# Patient Record
Sex: Female | Born: 1950 | Race: White | Hispanic: No
Health system: Southern US, Community
[De-identification: ages and names within clinical notes are randomized; demographics above are authoritative.]

## PROBLEM LIST (undated history)

## (undated) DIAGNOSIS — I2510 Atherosclerotic heart disease of native coronary artery without angina pectoris: Secondary | ICD-10-CM

## (undated) DIAGNOSIS — F32A Depression, unspecified: Secondary | ICD-10-CM

## (undated) DIAGNOSIS — N289 Disorder of kidney and ureter, unspecified: Secondary | ICD-10-CM

## (undated) DIAGNOSIS — G479 Sleep disorder, unspecified: Secondary | ICD-10-CM

## (undated) DIAGNOSIS — E785 Hyperlipidemia, unspecified: Secondary | ICD-10-CM

## (undated) DIAGNOSIS — I639 Cerebral infarction, unspecified: Secondary | ICD-10-CM

## (undated) DIAGNOSIS — I509 Heart failure, unspecified: Secondary | ICD-10-CM

## (undated) DIAGNOSIS — E1121 Type 2 diabetes mellitus with diabetic nephropathy: Secondary | ICD-10-CM

## (undated) DIAGNOSIS — F329 Major depressive disorder, single episode, unspecified: Secondary | ICD-10-CM

## (undated) HISTORY — DX: Heart failure, unspecified: I50.9

## (undated) HISTORY — PX: ADENOIDECTOMY: SUR15

## (undated) HISTORY — PX: CHOLECYSTECTOMY: SHX55

## (undated) HISTORY — PX: TONSILLECTOMY: SUR1361

## (undated) HISTORY — DX: Major depressive disorder, single episode, unspecified: F32.9

## (undated) HISTORY — PX: CORONARY ARTERY BYPASS GRAFT: SHX141

## (undated) HISTORY — PX: WISDOM TOOTH EXTRACTION: SHX21

## (undated) HISTORY — DX: Depression, unspecified: F32.A

## (undated) HISTORY — PX: OTHER SURGICAL HISTORY: SHX169

## (undated) HISTORY — DX: Hyperlipidemia, unspecified: E78.5

## (undated) HISTORY — DX: Atherosclerotic heart disease of native coronary artery without angina pectoris: I25.10

## (undated) HISTORY — PX: TRACHEOSTOMY: SUR1362

## (undated) HISTORY — DX: Type 2 diabetes mellitus with diabetic nephropathy: E11.21

## (undated) HISTORY — DX: Sleep disorder, unspecified: G47.9

## (undated) HISTORY — DX: Cerebral infarction, unspecified: I63.9

---

## 1997-12-22 ENCOUNTER — Ambulatory Visit: Admission: RE | Admit: 1997-12-22 | Discharge: 1997-12-22 | Payer: Self-pay | Admitting: Internal Medicine

## 2000-02-22 ENCOUNTER — Inpatient Hospital Stay (HOSPITAL_COMMUNITY): Admission: AD | Admit: 2000-02-22 | Discharge: 2000-04-12 | Payer: Self-pay | Admitting: Cardiology

## 2000-02-22 ENCOUNTER — Encounter: Payer: Self-pay | Admitting: Cardiology

## 2000-02-29 ENCOUNTER — Encounter: Payer: Self-pay | Admitting: Thoracic Surgery (Cardiothoracic Vascular Surgery)

## 2000-03-01 ENCOUNTER — Encounter: Payer: Self-pay | Admitting: Thoracic Surgery (Cardiothoracic Vascular Surgery)

## 2000-03-02 ENCOUNTER — Encounter: Payer: Self-pay | Admitting: Thoracic Surgery (Cardiothoracic Vascular Surgery)

## 2000-03-02 ENCOUNTER — Encounter: Payer: Self-pay | Admitting: Cardiothoracic Surgery

## 2000-03-03 ENCOUNTER — Encounter: Payer: Self-pay | Admitting: Thoracic Surgery (Cardiothoracic Vascular Surgery)

## 2000-03-04 ENCOUNTER — Encounter: Payer: Self-pay | Admitting: Thoracic Surgery (Cardiothoracic Vascular Surgery)

## 2000-03-05 ENCOUNTER — Encounter: Payer: Self-pay | Admitting: Thoracic Surgery (Cardiothoracic Vascular Surgery)

## 2000-03-06 ENCOUNTER — Encounter: Payer: Self-pay | Admitting: Thoracic Surgery (Cardiothoracic Vascular Surgery)

## 2000-03-07 ENCOUNTER — Encounter: Payer: Self-pay | Admitting: Thoracic Surgery (Cardiothoracic Vascular Surgery)

## 2000-03-08 ENCOUNTER — Encounter: Payer: Self-pay | Admitting: Thoracic Surgery (Cardiothoracic Vascular Surgery)

## 2000-03-09 ENCOUNTER — Encounter: Payer: Self-pay | Admitting: Thoracic Surgery (Cardiothoracic Vascular Surgery)

## 2000-03-10 ENCOUNTER — Encounter: Payer: Self-pay | Admitting: Cardiothoracic Surgery

## 2000-03-11 ENCOUNTER — Encounter: Payer: Self-pay | Admitting: Thoracic Surgery (Cardiothoracic Vascular Surgery)

## 2000-03-11 ENCOUNTER — Encounter: Payer: Self-pay | Admitting: Cardiothoracic Surgery

## 2000-03-12 ENCOUNTER — Encounter: Payer: Self-pay | Admitting: Thoracic Surgery (Cardiothoracic Vascular Surgery)

## 2000-03-13 ENCOUNTER — Encounter: Payer: Self-pay | Admitting: Thoracic Surgery (Cardiothoracic Vascular Surgery)

## 2000-03-14 ENCOUNTER — Encounter: Payer: Self-pay | Admitting: Thoracic Surgery (Cardiothoracic Vascular Surgery)

## 2000-03-15 ENCOUNTER — Encounter: Payer: Self-pay | Admitting: Thoracic Surgery (Cardiothoracic Vascular Surgery)

## 2000-03-18 ENCOUNTER — Encounter: Payer: Self-pay | Admitting: Thoracic Surgery (Cardiothoracic Vascular Surgery)

## 2000-03-19 ENCOUNTER — Encounter: Payer: Self-pay | Admitting: Thoracic Surgery (Cardiothoracic Vascular Surgery)

## 2000-03-20 ENCOUNTER — Encounter: Payer: Self-pay | Admitting: Thoracic Surgery (Cardiothoracic Vascular Surgery)

## 2000-03-23 ENCOUNTER — Encounter: Payer: Self-pay | Admitting: Cardiothoracic Surgery

## 2000-03-24 ENCOUNTER — Encounter: Payer: Self-pay | Admitting: Cardiothoracic Surgery

## 2000-03-25 ENCOUNTER — Encounter: Payer: Self-pay | Admitting: Cardiothoracic Surgery

## 2000-03-27 ENCOUNTER — Encounter: Payer: Self-pay | Admitting: Thoracic Surgery (Cardiothoracic Vascular Surgery)

## 2000-03-29 ENCOUNTER — Encounter: Payer: Self-pay | Admitting: Thoracic Surgery (Cardiothoracic Vascular Surgery)

## 2000-04-01 ENCOUNTER — Encounter: Payer: Self-pay | Admitting: Thoracic Surgery (Cardiothoracic Vascular Surgery)

## 2000-04-03 ENCOUNTER — Encounter: Payer: Self-pay | Admitting: Thoracic Surgery (Cardiothoracic Vascular Surgery)

## 2000-04-04 ENCOUNTER — Encounter: Payer: Self-pay | Admitting: Thoracic Surgery (Cardiothoracic Vascular Surgery)

## 2000-04-10 ENCOUNTER — Encounter: Payer: Self-pay | Admitting: Thoracic Surgery (Cardiothoracic Vascular Surgery)

## 2000-04-12 ENCOUNTER — Encounter: Payer: Self-pay | Admitting: Thoracic Surgery (Cardiothoracic Vascular Surgery)

## 2000-04-12 ENCOUNTER — Inpatient Hospital Stay (HOSPITAL_COMMUNITY)
Admission: RE | Admit: 2000-04-12 | Discharge: 2000-05-11 | Payer: Self-pay | Admitting: Physical Medicine and Rehabilitation

## 2000-04-18 ENCOUNTER — Encounter: Payer: Self-pay | Admitting: Physical Medicine and Rehabilitation

## 2001-02-24 ENCOUNTER — Encounter: Admission: RE | Admit: 2001-02-24 | Discharge: 2001-05-25 | Payer: Self-pay | Admitting: Endocrinology

## 2002-04-02 ENCOUNTER — Ambulatory Visit (HOSPITAL_COMMUNITY): Admission: RE | Admit: 2002-04-02 | Discharge: 2002-04-02 | Payer: Self-pay | Admitting: Obstetrics and Gynecology

## 2002-04-02 ENCOUNTER — Encounter: Payer: Self-pay | Admitting: Obstetrics and Gynecology

## 2004-02-22 ENCOUNTER — Ambulatory Visit: Payer: Self-pay | Admitting: Cardiology

## 2004-04-06 ENCOUNTER — Ambulatory Visit: Payer: Self-pay | Admitting: Cardiology

## 2004-05-04 ENCOUNTER — Ambulatory Visit: Payer: Self-pay | Admitting: Cardiology

## 2004-05-25 ENCOUNTER — Ambulatory Visit: Payer: Self-pay | Admitting: Cardiology

## 2004-05-25 ENCOUNTER — Ambulatory Visit: Payer: Self-pay

## 2004-05-30 ENCOUNTER — Ambulatory Visit: Payer: Self-pay | Admitting: Cardiology

## 2004-06-12 ENCOUNTER — Ambulatory Visit: Payer: Self-pay | Admitting: Internal Medicine

## 2004-06-20 ENCOUNTER — Ambulatory Visit: Payer: Self-pay

## 2004-06-20 ENCOUNTER — Ambulatory Visit: Payer: Self-pay | Admitting: Cardiovascular Disease

## 2004-07-11 ENCOUNTER — Ambulatory Visit: Payer: Self-pay | Admitting: Internal Medicine

## 2004-08-29 ENCOUNTER — Ambulatory Visit: Payer: Self-pay | Admitting: Cardiology

## 2004-10-04 ENCOUNTER — Ambulatory Visit: Payer: Self-pay | Admitting: Cardiology

## 2004-10-10 ENCOUNTER — Ambulatory Visit: Payer: Self-pay | Admitting: Endocrinology

## 2004-10-11 ENCOUNTER — Ambulatory Visit: Payer: Self-pay | Admitting: Cardiovascular Disease

## 2004-10-27 ENCOUNTER — Ambulatory Visit: Payer: Self-pay | Admitting: Cardiology

## 2004-11-28 ENCOUNTER — Ambulatory Visit: Payer: Self-pay | Admitting: Cardiology

## 2004-12-05 ENCOUNTER — Ambulatory Visit: Payer: Self-pay | Admitting: Internal Medicine

## 2005-01-15 ENCOUNTER — Ambulatory Visit: Payer: Self-pay | Admitting: Cardiology

## 2005-09-14 ENCOUNTER — Ambulatory Visit: Payer: Self-pay | Admitting: Cardiology

## 2005-10-26 ENCOUNTER — Ambulatory Visit: Payer: Self-pay | Admitting: Internal Medicine

## 2005-11-12 ENCOUNTER — Ambulatory Visit: Payer: Self-pay | Admitting: Cardiology

## 2005-11-23 ENCOUNTER — Ambulatory Visit: Payer: Self-pay | Admitting: Endocrinology

## 2006-02-05 ENCOUNTER — Ambulatory Visit: Payer: Self-pay | Admitting: Cardiology

## 2006-02-05 ENCOUNTER — Ambulatory Visit: Payer: Self-pay | Admitting: Internal Medicine

## 2006-03-05 ENCOUNTER — Ambulatory Visit: Payer: Self-pay | Admitting: Internal Medicine

## 2006-04-02 ENCOUNTER — Ambulatory Visit: Payer: Self-pay | Admitting: Cardiology

## 2006-04-30 ENCOUNTER — Ambulatory Visit: Payer: Self-pay | Admitting: Cardiology

## 2006-06-06 ENCOUNTER — Ambulatory Visit: Payer: Self-pay | Admitting: Cardiology

## 2006-07-04 ENCOUNTER — Ambulatory Visit: Payer: Self-pay | Admitting: Cardiology

## 2006-08-01 ENCOUNTER — Ambulatory Visit: Payer: Self-pay | Admitting: Cardiology

## 2006-08-27 ENCOUNTER — Ambulatory Visit: Payer: Self-pay | Admitting: Cardiology

## 2006-08-27 LAB — CONVERTED CEMR LAB
BUN: 29 mg/dL — ABNORMAL HIGH (ref 6–23)
CO2: 28 meq/L (ref 19–32)
Calcium: 9 mg/dL (ref 8.4–10.5)
GFR calc Af Amer: 60 mL/min
GFR calc non Af Amer: 49 mL/min
Glucose, Bld: 224 mg/dL — ABNORMAL HIGH (ref 70–99)
Potassium: 4.5 meq/L (ref 3.5–5.1)

## 2006-08-29 ENCOUNTER — Ambulatory Visit: Payer: Self-pay | Admitting: Cardiovascular Disease

## 2006-09-27 ENCOUNTER — Ambulatory Visit: Payer: Self-pay | Admitting: Cardiology

## 2006-10-25 ENCOUNTER — Ambulatory Visit: Payer: Self-pay | Admitting: Cardiology

## 2006-10-29 ENCOUNTER — Ambulatory Visit: Payer: Self-pay | Admitting: Endocrinology

## 2006-11-22 ENCOUNTER — Ambulatory Visit: Payer: Self-pay | Admitting: Cardiology

## 2006-12-20 ENCOUNTER — Ambulatory Visit: Payer: Self-pay | Admitting: Internal Medicine

## 2007-01-10 ENCOUNTER — Encounter: Payer: Self-pay | Admitting: *Deleted

## 2007-01-10 DIAGNOSIS — K8020 Calculus of gallbladder without cholecystitis without obstruction: Secondary | ICD-10-CM | POA: Insufficient documentation

## 2007-01-10 DIAGNOSIS — E1129 Type 2 diabetes mellitus with other diabetic kidney complication: Secondary | ICD-10-CM

## 2007-01-10 DIAGNOSIS — E785 Hyperlipidemia, unspecified: Secondary | ICD-10-CM | POA: Insufficient documentation

## 2007-01-10 DIAGNOSIS — E1139 Type 2 diabetes mellitus with other diabetic ophthalmic complication: Secondary | ICD-10-CM | POA: Insufficient documentation

## 2007-01-10 DIAGNOSIS — F329 Major depressive disorder, single episode, unspecified: Secondary | ICD-10-CM

## 2007-01-17 ENCOUNTER — Ambulatory Visit: Payer: Self-pay | Admitting: Internal Medicine

## 2007-02-14 ENCOUNTER — Ambulatory Visit: Payer: Self-pay | Admitting: Cardiology

## 2007-02-26 ENCOUNTER — Ambulatory Visit: Payer: Self-pay | Admitting: Endocrinology

## 2007-03-14 ENCOUNTER — Ambulatory Visit: Payer: Self-pay | Admitting: Internal Medicine

## 2007-03-28 ENCOUNTER — Ambulatory Visit: Payer: Self-pay | Admitting: Internal Medicine

## 2007-04-23 ENCOUNTER — Ambulatory Visit: Payer: Self-pay | Admitting: Endocrinology

## 2007-04-23 DIAGNOSIS — I509 Heart failure, unspecified: Secondary | ICD-10-CM | POA: Insufficient documentation

## 2007-04-24 LAB — CONVERTED CEMR LAB
CO2: 29 meq/L (ref 19–32)
Calcium: 9.7 mg/dL (ref 8.4–10.5)
GFR calc Af Amer: 54 mL/min
GFR calc non Af Amer: 45 mL/min
Glucose, Bld: 153 mg/dL — ABNORMAL HIGH (ref 70–99)
Potassium: 4.4 meq/L (ref 3.5–5.1)

## 2007-04-25 ENCOUNTER — Ambulatory Visit: Payer: Self-pay | Admitting: Cardiology

## 2007-04-28 ENCOUNTER — Ambulatory Visit: Payer: Self-pay | Admitting: Internal Medicine

## 2007-04-28 LAB — CONVERTED CEMR LAB
Chloride: 106 meq/L (ref 96–112)
GFR calc Af Amer: 46 mL/min
GFR calc non Af Amer: 38 mL/min
Glucose, Bld: 96 mg/dL (ref 70–99)
Potassium: 4.1 meq/L (ref 3.5–5.1)
Sodium: 140 meq/L (ref 135–145)
TSH: 2.58 microintl units/mL (ref 0.35–5.50)

## 2007-05-09 ENCOUNTER — Encounter: Payer: Self-pay | Admitting: Cardiology

## 2007-05-09 ENCOUNTER — Ambulatory Visit: Payer: Self-pay

## 2007-05-09 ENCOUNTER — Ambulatory Visit: Payer: Self-pay | Admitting: Cardiology

## 2007-05-28 ENCOUNTER — Ambulatory Visit: Payer: Self-pay | Admitting: Cardiology

## 2007-05-28 ENCOUNTER — Ambulatory Visit: Payer: Self-pay | Admitting: Cardiovascular Disease

## 2007-05-28 LAB — CONVERTED CEMR LAB
GFR calc Af Amer: 43 mL/min
GFR calc non Af Amer: 35 mL/min
Glucose, Bld: 132 mg/dL — ABNORMAL HIGH (ref 70–99)
Potassium: 4.4 meq/L (ref 3.5–5.1)
Pro B Natriuretic peptide (BNP): 192 pg/mL — ABNORMAL HIGH (ref 0.0–100.0)
Sodium: 140 meq/L (ref 135–145)

## 2007-06-04 ENCOUNTER — Ambulatory Visit: Payer: Self-pay | Admitting: Endocrinology

## 2007-06-25 ENCOUNTER — Ambulatory Visit: Payer: Self-pay | Admitting: Cardiology

## 2007-07-09 ENCOUNTER — Ambulatory Visit: Payer: Self-pay | Admitting: Cardiology

## 2007-07-23 ENCOUNTER — Ambulatory Visit: Payer: Self-pay | Admitting: Internal Medicine

## 2007-08-06 ENCOUNTER — Ambulatory Visit: Payer: Self-pay | Admitting: Cardiology

## 2007-08-15 ENCOUNTER — Ambulatory Visit: Payer: Self-pay | Admitting: Internal Medicine

## 2007-08-15 LAB — CONVERTED CEMR LAB
BUN: 41 mg/dL — ABNORMAL HIGH (ref 6–23)
Calcium: 9.5 mg/dL (ref 8.4–10.5)
Creatinine, Ser: 1.6 mg/dL — ABNORMAL HIGH (ref 0.4–1.2)
GFR calc non Af Amer: 35 mL/min
Pro B Natriuretic peptide (BNP): 118 pg/mL — ABNORMAL HIGH (ref 0.0–100.0)

## 2007-08-27 ENCOUNTER — Ambulatory Visit: Payer: Self-pay | Admitting: Internal Medicine

## 2007-09-10 ENCOUNTER — Ambulatory Visit: Payer: Self-pay | Admitting: Cardiology

## 2007-09-24 ENCOUNTER — Ambulatory Visit: Payer: Self-pay | Admitting: Cardiology

## 2007-10-28 ENCOUNTER — Telehealth (INDEPENDENT_AMBULATORY_CARE_PROVIDER_SITE_OTHER): Payer: Self-pay | Admitting: *Deleted

## 2007-11-04 ENCOUNTER — Ambulatory Visit: Payer: Self-pay | Admitting: Endocrinology

## 2007-11-19 ENCOUNTER — Telehealth: Payer: Self-pay | Admitting: Endocrinology

## 2007-12-09 ENCOUNTER — Ambulatory Visit: Payer: Self-pay | Admitting: Internal Medicine

## 2007-12-09 LAB — CONVERTED CEMR LAB
CO2: 24 meq/L (ref 19–32)
Calcium: 9.7 mg/dL (ref 8.4–10.5)
Chloride: 110 meq/L (ref 96–112)
Creatinine, Ser: 1.7 mg/dL — ABNORMAL HIGH (ref 0.4–1.2)
Glucose, Bld: 136 mg/dL — ABNORMAL HIGH (ref 70–99)
Pro B Natriuretic peptide (BNP): 93 pg/mL (ref 0.0–100.0)
Sodium: 139 meq/L (ref 135–145)
TSH: 2.6 microintl units/mL (ref 0.35–5.50)

## 2007-12-24 ENCOUNTER — Telehealth: Payer: Self-pay | Admitting: Endocrinology

## 2008-01-06 ENCOUNTER — Ambulatory Visit: Payer: Self-pay | Admitting: Cardiology

## 2008-01-20 ENCOUNTER — Ambulatory Visit: Payer: Self-pay | Admitting: Cardiology

## 2008-02-03 ENCOUNTER — Ambulatory Visit: Payer: Self-pay | Admitting: Endocrinology

## 2008-02-03 LAB — CONVERTED CEMR LAB
ALT: 22 units/L (ref 0–35)
Albumin: 3.9 g/dL (ref 3.5–5.2)
Bilirubin, Direct: 0.1 mg/dL (ref 0.0–0.3)
Direct LDL: 100.9 mg/dL
HDL: 29.2 mg/dL — ABNORMAL LOW (ref >39.0)
Hgb A1c MFr Bld: 6.5 % — ABNORMAL HIGH (ref 4.6–6.0)
Total Protein: 8 g/dL (ref 6.0–8.3)
Triglycerides: 238 mg/dL (ref 0–149)
VLDL: 48 mg/dL — ABNORMAL HIGH (ref 0–40)

## 2008-02-11 ENCOUNTER — Ambulatory Visit: Payer: Self-pay | Admitting: Cardiology

## 2008-03-03 ENCOUNTER — Ambulatory Visit: Payer: Self-pay | Admitting: Cardiology

## 2008-03-31 ENCOUNTER — Ambulatory Visit: Payer: Self-pay | Admitting: Cardiovascular Disease

## 2008-04-23 ENCOUNTER — Ambulatory Visit: Payer: Self-pay | Admitting: Cardiology

## 2008-04-23 ENCOUNTER — Ambulatory Visit: Payer: Self-pay | Admitting: Internal Medicine

## 2008-04-28 ENCOUNTER — Ambulatory Visit: Payer: Self-pay | Admitting: Endocrinology

## 2008-04-28 LAB — CONVERTED CEMR LAB
CO2: 27 meq/L (ref 19–32)
Calcium: 10.2 mg/dL (ref 8.4–10.5)
Creatinine, Ser: 1.6 mg/dL — ABNORMAL HIGH (ref 0.4–1.2)
GFR calc Af Amer: 43 mL/min
GFR calc non Af Amer: 35 mL/min
Hgb A1c MFr Bld: 6.4 % — ABNORMAL HIGH (ref 4.6–6.0)
Pro B Natriuretic peptide (BNP): 78 pg/mL (ref 0.0–100.0)
Sodium: 138 meq/L (ref 135–145)

## 2008-05-28 ENCOUNTER — Ambulatory Visit: Payer: Self-pay | Admitting: Cardiology

## 2008-05-28 ENCOUNTER — Encounter: Payer: Self-pay | Admitting: Endocrinology

## 2008-06-25 ENCOUNTER — Ambulatory Visit: Payer: Self-pay | Admitting: Cardiovascular Disease

## 2008-07-01 ENCOUNTER — Telehealth (INDEPENDENT_AMBULATORY_CARE_PROVIDER_SITE_OTHER): Payer: Self-pay | Admitting: *Deleted

## 2008-07-27 ENCOUNTER — Ambulatory Visit: Payer: Self-pay | Admitting: Endocrinology

## 2008-07-27 ENCOUNTER — Encounter (INDEPENDENT_AMBULATORY_CARE_PROVIDER_SITE_OTHER): Payer: Self-pay | Admitting: *Deleted

## 2008-07-27 LAB — CONVERTED CEMR LAB
BUN: 45 mg/dL — ABNORMAL HIGH (ref 6–23)
Calcium: 9.7 mg/dL (ref 8.4–10.5)
Creatinine, Ser: 1.8 mg/dL — ABNORMAL HIGH (ref 0.4–1.2)
GFR calc non Af Amer: 30.62 mL/min (ref >60)
Glucose, Bld: 179 mg/dL — ABNORMAL HIGH (ref 70–99)
Potassium: 5.2 meq/L — ABNORMAL HIGH (ref 3.5–5.1)
Pro B Natriuretic peptide (BNP): 95 pg/mL (ref 0.0–100.0)

## 2008-08-12 ENCOUNTER — Ambulatory Visit: Payer: Self-pay | Admitting: Cardiology

## 2008-08-12 DIAGNOSIS — I5022 Chronic systolic (congestive) heart failure: Secondary | ICD-10-CM

## 2008-08-12 DIAGNOSIS — I6350 Cerebral infarction due to unspecified occlusion or stenosis of unspecified cerebral artery: Secondary | ICD-10-CM | POA: Insufficient documentation

## 2008-08-12 DIAGNOSIS — I509 Heart failure, unspecified: Secondary | ICD-10-CM

## 2008-08-12 DIAGNOSIS — R209 Unspecified disturbances of skin sensation: Secondary | ICD-10-CM

## 2008-08-12 DIAGNOSIS — I110 Hypertensive heart disease with heart failure: Secondary | ICD-10-CM | POA: Insufficient documentation

## 2008-08-17 ENCOUNTER — Ambulatory Visit: Payer: Self-pay | Admitting: Internal Medicine

## 2008-09-02 ENCOUNTER — Telehealth (INDEPENDENT_AMBULATORY_CARE_PROVIDER_SITE_OTHER): Payer: Self-pay | Admitting: *Deleted

## 2008-09-14 ENCOUNTER — Ambulatory Visit: Payer: Self-pay | Admitting: Cardiovascular Disease

## 2008-09-14 ENCOUNTER — Encounter: Payer: Self-pay | Admitting: *Deleted

## 2008-09-14 LAB — CONVERTED CEMR LAB: Protime: 20.4

## 2008-10-12 ENCOUNTER — Ambulatory Visit: Payer: Self-pay | Admitting: Cardiology

## 2008-10-12 LAB — CONVERTED CEMR LAB: Prothrombin Time: 22.3 s

## 2008-10-20 ENCOUNTER — Encounter: Payer: Self-pay | Admitting: *Deleted

## 2008-10-26 ENCOUNTER — Ambulatory Visit: Payer: Self-pay | Admitting: Endocrinology

## 2008-10-26 ENCOUNTER — Telehealth: Payer: Self-pay | Admitting: Endocrinology

## 2008-10-26 DIAGNOSIS — E876 Hypokalemia: Secondary | ICD-10-CM

## 2008-10-26 LAB — CONVERTED CEMR LAB
CO2: 23 meq/L (ref 19–32)
Calcium: 9.7 mg/dL (ref 8.4–10.5)
Glucose, Bld: 78 mg/dL (ref 70–99)
Potassium: 4.7 meq/L (ref 3.5–5.1)
Sodium: 136 meq/L (ref 135–145)

## 2008-10-27 ENCOUNTER — Ambulatory Visit: Payer: Self-pay | Admitting: Internal Medicine

## 2008-10-27 LAB — CONVERTED CEMR LAB
POC INR: 2.2
Prothrombin Time: 18.2 s

## 2008-11-03 ENCOUNTER — Encounter: Payer: Self-pay | Admitting: Endocrinology

## 2008-11-24 ENCOUNTER — Ambulatory Visit: Payer: Self-pay | Admitting: Cardiology

## 2008-11-24 LAB — CONVERTED CEMR LAB: POC INR: 2.5

## 2008-12-03 ENCOUNTER — Ambulatory Visit: Payer: Self-pay | Admitting: Cardiology

## 2008-12-03 DIAGNOSIS — I2510 Atherosclerotic heart disease of native coronary artery without angina pectoris: Secondary | ICD-10-CM | POA: Insufficient documentation

## 2008-12-03 DIAGNOSIS — R0989 Other specified symptoms and signs involving the circulatory and respiratory systems: Secondary | ICD-10-CM | POA: Insufficient documentation

## 2008-12-22 ENCOUNTER — Ambulatory Visit: Payer: Self-pay

## 2008-12-22 ENCOUNTER — Ambulatory Visit: Payer: Self-pay | Admitting: Cardiology

## 2008-12-22 LAB — CONVERTED CEMR LAB: POC INR: 2.6

## 2009-01-03 ENCOUNTER — Encounter: Payer: Self-pay | Admitting: Cardiology

## 2009-01-11 ENCOUNTER — Encounter: Payer: Self-pay | Admitting: Cardiology

## 2009-01-19 ENCOUNTER — Ambulatory Visit: Payer: Self-pay | Admitting: Cardiology

## 2009-02-16 ENCOUNTER — Ambulatory Visit: Payer: Self-pay | Admitting: Cardiovascular Disease

## 2009-03-02 ENCOUNTER — Encounter (INDEPENDENT_AMBULATORY_CARE_PROVIDER_SITE_OTHER): Payer: Self-pay | Admitting: *Deleted

## 2009-03-18 ENCOUNTER — Telehealth: Payer: Self-pay | Admitting: Endocrinology

## 2009-03-22 ENCOUNTER — Ambulatory Visit: Payer: Self-pay | Admitting: Internal Medicine

## 2009-03-24 ENCOUNTER — Encounter (INDEPENDENT_AMBULATORY_CARE_PROVIDER_SITE_OTHER): Payer: Self-pay | Admitting: Nurse Practitioner

## 2009-03-28 ENCOUNTER — Telehealth: Payer: Self-pay | Admitting: Endocrinology

## 2009-04-19 ENCOUNTER — Ambulatory Visit: Payer: Self-pay | Admitting: Cardiology

## 2009-04-19 ENCOUNTER — Encounter (INDEPENDENT_AMBULATORY_CARE_PROVIDER_SITE_OTHER): Payer: Self-pay | Admitting: Cardiology

## 2009-04-19 LAB — CONVERTED CEMR LAB: POC INR: 2

## 2009-06-09 ENCOUNTER — Encounter (INDEPENDENT_AMBULATORY_CARE_PROVIDER_SITE_OTHER): Payer: Self-pay | Admitting: Cardiology

## 2009-06-15 ENCOUNTER — Ambulatory Visit: Payer: Self-pay | Admitting: Cardiology

## 2009-06-15 LAB — CONVERTED CEMR LAB: POC INR: 2.8

## 2009-07-12 ENCOUNTER — Ambulatory Visit: Payer: Self-pay | Admitting: Cardiology

## 2009-07-12 LAB — CONVERTED CEMR LAB: POC INR: 2.3

## 2009-08-08 ENCOUNTER — Ambulatory Visit: Payer: Self-pay | Admitting: Cardiovascular Disease

## 2009-08-08 LAB — CONVERTED CEMR LAB: POC INR: 2.1

## 2009-10-05 ENCOUNTER — Telehealth: Payer: Self-pay | Admitting: Endocrinology

## 2009-10-07 ENCOUNTER — Ambulatory Visit: Payer: Self-pay | Admitting: Endocrinology

## 2009-10-07 DIAGNOSIS — L989 Disorder of the skin and subcutaneous tissue, unspecified: Secondary | ICD-10-CM | POA: Insufficient documentation

## 2009-10-07 LAB — CONVERTED CEMR LAB
ALT: 29 units/L (ref 0–35)
BUN: 35 mg/dL — ABNORMAL HIGH (ref 6–23)
Bilirubin Urine: NEGATIVE
CO2: 28 meq/L (ref 19–32)
Chloride: 111 meq/L (ref 96–112)
Cholesterol: 175 mg/dL (ref 0–200)
Eosinophils Relative: 4.9 % (ref 0.0–5.0)
Glucose, Bld: 73 mg/dL (ref 70–99)
HCT: 41.1 % (ref 36.0–46.0)
Hgb A1c MFr Bld: 6.2 % (ref 4.6–6.5)
Ketones, ur: NEGATIVE mg/dL
LDL Cholesterol: 105 mg/dL — ABNORMAL HIGH (ref 0–99)
Lymphs Abs: 1.8 10*3/uL (ref 0.7–4.0)
MCHC: 34.3 g/dL (ref 30.0–36.0)
MCV: 95.7 fL (ref 78.0–100.0)
Microalb Creat Ratio: 5 mg/g (ref 0.0–30.0)
Monocytes Absolute: 0.8 10*3/uL (ref 0.1–1.0)
Platelets: 229 10*3/uL (ref 150.0–400.0)
Potassium: 4.6 meq/L (ref 3.5–5.1)
TSH: 2.13 microintl units/mL (ref 0.35–5.50)
Total Bilirubin: 0.7 mg/dL (ref 0.3–1.2)
Total Protein: 7.8 g/dL (ref 6.0–8.3)
WBC: 7.8 10*3/uL (ref 4.5–10.5)
pH: 5.5 (ref 5.0–8.0)

## 2009-10-24 ENCOUNTER — Telehealth: Payer: Self-pay | Admitting: Endocrinology

## 2009-10-28 ENCOUNTER — Encounter: Payer: Self-pay | Admitting: Cardiology

## 2009-11-02 ENCOUNTER — Ambulatory Visit: Payer: Self-pay | Admitting: Cardiology

## 2009-11-02 DIAGNOSIS — I10 Essential (primary) hypertension: Secondary | ICD-10-CM

## 2009-11-02 LAB — CONVERTED CEMR LAB: POC INR: 2.3

## 2009-11-04 ENCOUNTER — Ambulatory Visit: Payer: Self-pay | Admitting: Endocrinology

## 2009-11-04 ENCOUNTER — Other Ambulatory Visit: Admission: RE | Admit: 2009-11-04 | Discharge: 2009-11-04 | Payer: Self-pay | Admitting: Endocrinology

## 2009-11-29 ENCOUNTER — Telehealth (INDEPENDENT_AMBULATORY_CARE_PROVIDER_SITE_OTHER): Payer: Self-pay | Admitting: Radiology

## 2009-11-30 ENCOUNTER — Encounter (HOSPITAL_COMMUNITY): Admission: RE | Admit: 2009-11-30 | Discharge: 2010-01-03 | Payer: Self-pay | Admitting: Cardiology

## 2009-11-30 ENCOUNTER — Ambulatory Visit: Payer: Self-pay

## 2009-11-30 ENCOUNTER — Ambulatory Visit: Payer: Self-pay | Admitting: Cardiology

## 2009-11-30 ENCOUNTER — Ambulatory Visit: Payer: Self-pay | Admitting: Cardiovascular Disease

## 2009-11-30 ENCOUNTER — Encounter: Payer: Self-pay | Admitting: Cardiology

## 2009-12-07 ENCOUNTER — Ambulatory Visit: Payer: Self-pay

## 2009-12-07 ENCOUNTER — Encounter: Payer: Self-pay | Admitting: Cardiology

## 2009-12-16 ENCOUNTER — Ambulatory Visit: Payer: Self-pay | Admitting: Endocrinology

## 2009-12-16 LAB — CONVERTED CEMR LAB: Hgb A1c MFr Bld: 6.5 % (ref 4.6–6.5)

## 2010-01-18 ENCOUNTER — Ambulatory Visit: Payer: Self-pay | Admitting: Cardiovascular Disease

## 2010-02-01 ENCOUNTER — Ambulatory Visit: Payer: Self-pay | Admitting: Cardiology

## 2010-03-03 ENCOUNTER — Ambulatory Visit: Payer: Self-pay | Admitting: Cardiology

## 2010-03-03 LAB — CONVERTED CEMR LAB: POC INR: 2.4

## 2010-03-17 ENCOUNTER — Ambulatory Visit: Payer: Self-pay | Admitting: Endocrinology

## 2010-03-17 LAB — CONVERTED CEMR LAB: Hgb A1c MFr Bld: 6.8 % — ABNORMAL HIGH (ref 4.6–6.5)

## 2010-03-31 ENCOUNTER — Ambulatory Visit: Payer: Self-pay

## 2010-05-10 ENCOUNTER — Encounter (INDEPENDENT_AMBULATORY_CARE_PROVIDER_SITE_OTHER): Payer: Self-pay | Admitting: Pharmacist

## 2010-05-14 LAB — CONVERTED CEMR LAB
ALT: 23 units/L (ref 0–35)
AST: 25 units/L (ref 0–37)
Bilirubin, Direct: 0 mg/dL (ref 0.0–0.3)
Cholesterol: 161 mg/dL (ref 0–200)
Total Bilirubin: 0.8 mg/dL (ref 0.3–1.2)

## 2010-05-15 ENCOUNTER — Ambulatory Visit: Admission: RE | Admit: 2010-05-15 | Discharge: 2010-05-15 | Payer: Self-pay | Source: Home / Self Care

## 2010-05-15 LAB — CONVERTED CEMR LAB: POC INR: 1.8

## 2010-05-16 NOTE — Assessment & Plan Note (Signed)
Summary: rov/jss  Medications Added HYDRALAZINE HCL 25 MG  TABS (HYDRALAZINE HCL) 1 twice a day VITAMIN C 500 MG CHEW (ASCORBIC ACID) 1 by mouth dialy METROGEL-VAGINAL 0.75 % GEL (METRONIDAZOLE) as directed CERAVE  CREA (EMOLLIENT) as directed      Allergies Added: NKDA   Visit Type:  Follow-up Primary Provider:  Minus Breeding MD  CC:  CAD.  History of Present Illness: The patient presents for followup. Since I last saw her she has had no acute cardiovascular problems. She is not overly active though she has to climb stairs to her second-story apartment. She has to take a break couple of times on the stairs but this is not new. She does not get chest discomfort with this. She may however get chest discomfort with emotional stress. She thinks this might be a stable pattern. She does have dyspnea with exertion but no PND or orthopnea. She is not describing any palpitations, presyncope or syncope.  Current Medications (verified): 1)  Adult Aspirin Low Strength 81 Mg  Tbdp (Aspirin) .... Take 1 By Mouth Qd 2)  Multivitamins   Tabs (Multiple Vitamin) .... Take 1 By Mouth Qd 3)  Ziac 10-6.25 Mg  Tabs (Bisoprolol-Hydrochlorothiazide) .... Take 1 By Mouth Qd 4)  Enalapril Maleate 20 Mg  Tabs (Enalapril Maleate) .... Take 1 By Mouth Two Times A Day Qd 5)  Lasix 20 Mg  Tabs (Furosemide) .Marland Kitchen.. 1 By Mouth Every Other Day 6)  Hydralazine Hcl 25 Mg  Tabs (Hydralazine Hcl) .Marland Kitchen.. 1 By Mouth Daily 7)  Vitamin D 1000 Unit Tabs (Cholecalciferol) .... Take 1 By Mouth Qd 8)  Fish Oil 1000 Mg Caps (Omega-3 Fatty Acids) .... Take 1 By Mouth Qd 9)  Coumadin 5 Mg Tabs (Warfarin Sodium) .... Take As Directed By Anticoagulation Clinic 10)  Simvastatin 40 Mg Tabs (Simvastatin) .Marland Kitchen.. 1 Once Daily 11)  Humulin 70/30 70-30 % Susp (Insulin Isophane & Regular) .... 30 Units Two Times A Day 12)  Vitamin C 500 Mg Chew (Ascorbic Acid) .Marland Kitchen.. 1 By Mouth Dialy 13)  Metrogel-Vaginal 0.75 % Gel (Metronidazole) .... As  Directed 14)  Cerave  Crea (Emollient) .... As Directed  Allergies (verified): No Known Drug Allergies  Past History:  Past Medical History: Reviewed history from 12/03/2008 and no changes required. CHF (ICD-428.0) (EF 25%) NEED PROPHYLACTIC VACCINATION&INOCULATION FLU (ICD-V04.81) CHOLELITHIASIS (ICD-574.20) MORBID OBESITY (ICD-278.01) DIABETIC  RETINOPATHY (ICD-250.50) NEPHROPATHY, DIABETIC (ICD-250.40) HYPERLIPIDEMIA (ICD-272.4) DIABETES MELLITUS, TYPE II (ICD-250.00) DEPRESSION (ICD-311) CAD CVA  Past Surgical History: Reviewed history from 12/03/2008 and no changes required. Coronary artery bypass graft x 5 (Left internal mammary artery (LIMA) to the left anterior     descending artery (LAD); saphenous vein graft (SVG) to obtuse     marginal and posterior descending artery (PDA); saphenous vein     graft (SVG) to first diagonal; saphenous vein graft (SVG) to obtuse     marginal 2). Tonsillectomy Adnoidectomy  Review of Systems       As stated in the HPI and negative for all other systems.   Vital Signs:  Patient profile:   60 year old female Height:      62 inches Weight:      257 pounds BMI:     47.18 Pulse rate:   61 / minute Resp:     18 per minute BP sitting:   141 / 66  (left arm)  Vitals Entered By: Marrion Coy, CNA (November 02, 2009 9:02 AM)  Physical Exam  General:  Well developed, well nourished, in no acute distress. Head:  normocephalic and atraumatic Eyes:  PERRLA/EOM intact; conjunctiva and lids normal. Mouth:  Teeth, gums and palate normal. Oral mucosa normal. Neck:  Neck supple, no JVD. No masses, thyromegaly or abnormal cervical nodes. Chest Wall:  Well-healed sternotomy scar Lungs:  Clear bilaterally to auscultation and percussion. Abdomen:  Bowel sounds positive; abdomen soft and non-tender without masses, organomegaly, or hernias noted. No hepatosplenomegaly, obese Msk:  Back normal, normal gait. Muscle strength and tone  normal. Extremities:  No clubbing or cyanosis. Neurologic:  Alert and oriented x 3. Skin:  Intact without lesions or rashes. Cervical Nodes:  no significant adenopathy Inguinal Nodes:  no significant adenopathy Psych:  Flat affect.   Detailed Cardiovascular Exam  Neck    Carotids: Carotids full and equal bilaterally without bruits.      Neck Veins: Normal, no JVD.    Heart    Inspection: no deformities or lifts noted.      Palpation: normal PMI with no thrills palpable.      Auscultation: regular rate and rhythm, S1, S2 without murmurs, rubs, gallops, or clicks.    Vascular    Abdominal Aorta: no palpable masses, pulsations, or audible bruits.      Femoral Pulses: normal femoral pulses bilaterally.      Pedal Pulses: normal pedal pulses bilaterally.      Radial Pulses: normal radial pulses bilaterally.      Peripheral Circulation: no clubbing, cyanosis, or edema noted with normal capillary refill.     EKG  Procedure date:  11/02/2009  Findings:      Sinus rhythm, left bundle branch block, right axis deviation  Impression & Recommendations:  Problem # 1:  CAD (ICD-414.00) It Ihas been 10 years since the patient's bypass in 6 years since her last stress test. I will order a pharmacologic perfusion study. She would not be a little walk on a treadmill. Orders: Nuclear Stress Test (Nuc Stress Test)  Problem # 2:  CHF (ICD-428.0) She seems to be euvolemic. We will assess her EF at the time of the Cardiolite. She will remain on the meds as listed.  Problem # 3:  MORBID OBESITY (ICD-278.01) We again discussed weight loss strategies.  Problem # 4:  HYPERLIPIDEMIA (ICD-272.4) I reviewed her lipids. She is not at goal. However, she has been given a new prescription by Dr. Everardo All and I will defer to his management.  Problem # 5:  ESSENTIAL HYPERTENSION, BENIGN (ICD-401.1) Her blood pressure is not controlled. She has only been taking the hydralazine once daily. I will change  this to twice daily and it can be titrated to t.i.d. if her blood pressure remains elevated. Her home systolics are typically in the 150s by her report.  Patient Instructions: 1)  Your physician recommends that you schedule a follow-up appointment in: 12 months with Dr Antoine Poche 2)  Your physician recommends that you continue on your current medications as directed. Please refer to the Current Medication list given to you today but take Hydralazine one tablet twice a day 3)  Your physician has requested that you have an adenosine myoview.  For further information please visit [email or web address]  Please follow instruction sheet, as given.

## 2010-05-16 NOTE — Progress Notes (Signed)
Summary: Nuc pre-Procedure  Phone Note Outgoing Call Call back at [facility] Phone [phone]   Call placed by: Leonia Corona, RT-N,  November 29, 2009 4:42 PM Call placed to: Patient Reason for Call: Confirm/change Appt Summary of Call: Left message with information on Myoview Information Sheet (see scanned document for details).      Nuclear Med Background Indications for Stress Test: Evaluation for Ischemia, Graft Patency   History: CABG, Echo, Heart Catheterization, Myocardial Perfusion Study  History Comments: 2001- Cath and CABG(X5) 2005- MPS- Mild Inf/Ant Ischemia.EF= 38% 1/09- Echo- EF= 20-25% CHF  Symptoms: DOE    Nuclear Pre-Procedure Cardiac Risk Factors: CVA, History of Smoking, Hypertension, IDDM Type 2, LBBB, Lipids, Obesity Height (in): 62  Nuclear Med Study Referring MD:  Romero Belling MD

## 2010-05-16 NOTE — Medication Information (Signed)
Summary: rov/ewj  Anticoagulant Therapy  Managed by: Bethena Midget, RN, BSN Referring MD: Romero Belling MD PCP: Minus Breeding MD Supervising MD: Excell Seltzer MD, Casimiro Needle Indication 1: CVA-stroke (ICD-436) Lab Used: [facility] Site: [facility] INR POC 2.1 INR RANGE 2 - 3  Dietary changes: no    Health status changes: no    Bleeding/hemorrhagic complications: no    Recent/future hospitalizations: no    Any changes in medication regimen? no    Recent/future dental: no  Any missed doses?: no       Is patient compliant with meds? yes       Allergies: No Known Drug Allergies  Anticoagulation Management History:      The patient is taking warfarin and comes in today for a routine follow up visit.  Positive risk factors for bleeding include history of CVA/TIA and presence of serious comorbidities.  Negative risk factors for bleeding include an age less than 38 years old.  The bleeding index is 'intermediate risk'.  Positive CHADS2 values include History of CHF, History of Diabetes, and Prior Stroke/CVA/TIA.  Negative CHADS2 values include Age > 69 years old.  The start date was 04/16/2000.  Her last INR was 2.3 ratio.  Anticoagulation responsible provider: Excell Seltzer MD, Casimiro Needle.  INR POC: 2.1.  Cuvette Lot#: [id number].  Exp: 09/2010.    Anticoagulation Management Assessment/Plan:      The patient's current anticoagulation dose is Coumadin 5 mg tabs: Take as directed by Anticoagulation Clinic.  The target INR is 2 - 3.  The next INR is due 09/08/2009.  Anticoagulation instructions were given to patient.  Results were reviewed/authorized by Bethena Midget, RN, BSN.  She was notified by Bethena Midget, RN, BSN.         Prior Anticoagulation Instructions: INR 2.3  Continue on same dosage 1 tablet daily except 1/2 tablet on Mondays and Fridays.  Recheck in 4 weeks.    Current Anticoagulation Instructions: INR 2.1 Continue 5mg s daily except 2.5mg s on Mondays and Fridays. Recheck in 4 weeks.

## 2010-05-16 NOTE — Progress Notes (Signed)
Summary: Chol med  Phone Note Call from Patient   Caller: Daughter [phone] Summary of Call: Pt's daughetr called stating that she would like MD to send in alternate chol med to [facility] on [address]. Pt has no preference except for generic. Initial call taken by: Margaret Pyle, CMA,  October 24, 2009 11:42 AM  Follow-up for Phone Call        i sent rx go to lab in 30 days for lipids 272.0, and liver v58.69. also, please call coumadin clinic, and advise then i started zocor. Follow-up by: Minus Breeding MD,  October 24, 2009 12:13 PM  Additional Follow-up for Phone Call Additional follow up Details #1::        Pt's daughter informed and labs scheduled for 11/28/2008 Additional Follow-up by: Margaret Pyle, CMA,  October 24, 2009 2:27 PM    New/Updated Medications: SIMVASTATIN 40 MG TABS (SIMVASTATIN) 1 once daily Prescriptions: SIMVASTATIN 40 MG TABS (SIMVASTATIN) 1 once daily  #30 x 11   Entered by:   Margaret Pyle, CMA   Authorized by:   Minus Breeding MD   Signed by:   Margaret Pyle, CMA on 10/24/2009   Method used:   Electronically to        [id number]* (retail)       [address]       [place], [place]  [address]       Ph: [phone]       Fax: [phone]   RxID:   [id number] SIMVASTATIN 40 MG TABS (SIMVASTATIN) 1 once daily  #30 x 11   Entered and Authorized by:   Minus Breeding MD   Signed by:   Minus Breeding MD on 10/24/2009   Method used:   Electronically to        [facility]  [phone]* (retail)       [address]       [place], [place]  [address]       Ph: [phone] or [phone]       Fax: [phone]   RxID:   [id number]

## 2010-05-16 NOTE — Assessment & Plan Note (Signed)
Summary: f/u appt needs refill/cd   Vital Signs:  Patient profile:   60 year old female Height:      62 inches (157.48 cm) Weight:      260.75 pounds (118.52 kg) BMI:     47.86 O2 Sat:      93 % on Room air Temp:     97.2 degrees F (36.22 degrees C) oral Pulse rate:   64 / minute BP sitting:   179 / 57  (left arm) Cuff size:   small  Vitals Entered By: Brenton Grills MA (October 07, 2009 1:22 PM)  Nutrition Counseling: Patient's BMI is greater than 25 and therefore counseled on weight management options.  O2 Flow:  Room air CC: F/U appt/pt also taking hydralazine 25 mg 1 tab qd/pt states she needs new rx for insulin refill/wants referral to derm, wants to discuss mole that has changed in texture/aj   Referring Provider:  Rollene Rotunda Primary Provider:  Minus Breeding MD  CC:  F/U appt/pt also taking hydralazine 25 mg 1 tab qd/pt states she needs new rx for insulin refill/wants referral to derm and wants to discuss mole that has changed in texture/aj.  History of Present Illness: she brings a record of her cbg's which i have reviewed today.  it varies from 90-130, with no trend throughout the day.   pt states few years of moderate redness of the face, and associated "bumps." she takes pravachol. pyuria is noted on labs.   Current Medications (verified): 1)  Adult Aspirin Low Strength 81 Mg  Tbdp (Aspirin) .... Take 1 By Mouth Qd 2)  Multivitamins   Tabs (Multiple Vitamin) .... Take 1 By Mouth Qd 3)  Ziac 10-6.25 Mg  Tabs (Bisoprolol-Hydrochlorothiazide) .... Take 1 By Mouth Qd 4)  Enalapril Maleate 20 Mg  Tabs (Enalapril Maleate) .... Take 1 By Mouth Two Times A Day Qd 5)  Novolin 70/30 70-30 %  Susp (Insulin Isophane & Regular) .... 35 Units Twice A Day 6)  Lasix 20 Mg  Tabs (Furosemide) .Marland Kitchen.. 1 By Mouth Every Other Day 7)  Hydralazine Hcl 25 Mg  Tabs (Hydralazine Hcl) .... Take 1/2  By Mouth Three Times A Day 8)  Pravachol 40 Mg Tabs (Pravastatin Sodium) .... Take 2 Every  Morning 9)  Vitamin D 1000 Unit Tabs (Cholecalciferol) .... Take 1 By Mouth Qd 10)  Fish Oil 1000 Mg Caps (Omega-3 Fatty Acids) .... Take 1 By Mouth Qd 11)  Coumadin 5 Mg Tabs (Warfarin Sodium) .... Take As Directed By Anticoagulation Clinic  Allergies (verified): No Known Drug Allergies  Past History:  Past Medical History: Last updated: 12/03/2008 CHF (ICD-428.0) (EF 25%) NEED PROPHYLACTIC VACCINATION&INOCULATION FLU (ICD-V04.81) CHOLELITHIASIS (ICD-574.20) MORBID OBESITY (ICD-278.01) DIABETIC  RETINOPATHY (ICD-250.50) NEPHROPATHY, DIABETIC (ICD-250.40) HYPERLIPIDEMIA (ICD-272.4) DIABETES MELLITUS, TYPE II (ICD-250.00) DEPRESSION (ICD-311) CAD CVA  Review of Systems  The patient denies hypoglycemia.         she has slight numbness of the feet  Physical Exam  General:  morbidly obese.   Head:  there is erythema of the malar areas Pulses:  dorsalis pedis intact bilat.   Extremities:  no deformity.  no ulcer on the feet.  feet are of normal color and temp.  no edema.  there is a healed surgical (vein harvest) scar at the right leg.  Neurologic:  sensation is intact to touch on the feet, but decerased from normal Additional Exam:  LDL Cholesterol      [H]  161 mg/dL Hemoglobin  A1C            6.2 %   Impression & Recommendations:  Problem # 1:  DIABETES MELLITUS, TYPE II (ICD-250.00) overcontrolled  Problem # 2:  SKIN DISORDER (ICD-709.9) uncertain etiology  Problem # 3:  HYPERLIPIDEMIA (ICD-272.4) needs increased rx  Problem # 4:  pyuria uncertain etiology  Medications Added to Medication List This Visit: 1)  Novolin 70/30 70-30 % Susp (Insulin isophane & regular) .... 30 units twice a day 2)  Pravachol 40 Mg Tabs (Pravastatin sodium) .... Take 2 every morning  Other Orders: TLB-Lipid Panel (80061-LIPID) TLB-BMP (Basic Metabolic Panel-BMET) (80048-METABOL) TLB-CBC Platelet - w/Differential (85025-CBCD) TLB-Hepatic/Liver Function Pnl  (80076-HEPATIC) TLB-TSH (Thyroid Stimulating Hormone) (84443-TSH) TLB-A1C / Hgb A1C (Glycohemoglobin) (83036-A1C) TLB-Microalbumin/Creat Ratio, Urine (82043-MALB) TLB-Udip w/ Micro (81001-URINE) Dermatology Referral (Derma) Est. Patient Level IV ([address])  Patient Instructions: 1)  blood tests are being ordered for you today.  please call [phone] to hear your test results. 2)  please schedule a "medicare wellness" appointment. 3)  refer dermatology.  you will be called with a day and time for an appointment. 4)  (update: i left message on phone-tree:  reduce insulin to 30 units two times a day.  i advised change pravacol to zocor or crestor. call if uti sxs). Prescriptions: NOVOLIN 70/30 70-30 %  SUSP (INSULIN ISOPHANE & REGULAR) 35 units twice a day  #3 vials x 11   Entered and Authorized by:   Minus Breeding MD   Signed by:   Minus Breeding MD on 10/07/2009   Method used:   Electronically to        [facility]  [phone]* (retail)       [address]       [place], [place]  [address]       Ph: [phone] or [phone]       Fax: [phone]   RxID:   [id number]

## 2010-05-16 NOTE — Medication Information (Signed)
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 no    Recent/future dental: no  Any missed doses?: no       Is patient compliant with meds? yes       Allergies: No Known Drug Allergies  Anticoagulation Management History:      The patient is taking warfarin and comes in today for a routine follow up visit.  Positive risk factors for bleeding include history of CVA/TIA and presence of serious comorbidities.  Negative risk factors for bleeding include an age less than 58 years old.  The bleeding index is 'intermediate risk'.  Positive CHADS2 values include History of CHF, History of HTN, History of Diabetes, and Prior Stroke/CVA/TIA.  Negative CHADS2 values include Age > 32 years old.  The start date was 04/16/2000.  Her last INR was 2.3 ratio.  Anticoagulation responsible Sylvestre Rathgeber: Myrtis Ser MD, Tinnie Gens.  INR POC: 2.4.  Cuvette Lot#: [id number].  Exp: 02/2011.    Anticoagulation Management Assessment/Plan:      The patient's current anticoagulation dose is Coumadin 5 mg tabs: Take as directed by Anticoagulation Clinic.  The target INR is 2 - 3.  The next INR is due 03/01/2010.  Anticoagulation instructions were given to patient.  Results were reviewed/authorized by Weston Brass, PharmD.  She was notified by Weston Brass PharmD.         Prior Anticoagulation Instructions: INR 1.5  Take 1 1/2 tablets today and tomorrow. Then continue same dose of 1 tablet everyday except 1/2 tablet on Monday and Friday. Recheck in 2 weeks.   Current Anticoagulation Instructions: INR 2.4  Continue same dose of 1 tablet every  day except 1/2 tablet on Monday and Friday.  Recheck INR in 4 weeks.

## 2010-05-16 NOTE — Medication Information (Signed)
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 yes       Details: on ampicillin 500mg  daily  Recent/future dental: no  Any missed doses?: yes     Details: missed 1 dose about 2 weeks ago  Is patient compliant with meds? yes       Allergies: No Known Drug Allergies  Anticoagulation Management History:      The patient is taking warfarin and comes in today for a routine follow up visit.  Positive risk factors for bleeding include history of CVA/TIA and presence of serious comorbidities.  Negative risk factors for bleeding include an age less than 25 years old.  The bleeding index is 'intermediate risk'.  Positive CHADS2 values include History of CHF, History of HTN, History of Diabetes, and Prior Stroke/CVA/TIA.  Negative CHADS2 values include Age > 38 years old.  The start date was 04/16/2000.  Her last INR was 2.3 ratio.  Anticoagulation responsible Corry Storie: Juanda Chance MD, Smitty Cords.  INR POC: 2.3.  Cuvette Lot#: [id number].  Exp: 01/2011.    Anticoagulation Management Assessment/Plan:      The patient's current anticoagulation dose is Coumadin 5 mg tabs: Take as directed by Anticoagulation Clinic.  The target INR is 2 - 3.  The next INR is due 12/29/2009.  Anticoagulation instructions were given to patient.  Results were reviewed/authorized by Weston Brass, PharmD.  She was notified by Weston Brass PharmD.         Prior Anticoagulation Instructions: INR 2.3 Continue 5mg s everyday except 2.5mg s on Mondays and Fridays. Recheck in 4 weeks.   Current Anticoagulation Instructions: INR 2.3  Continue same dose of 1 tablet  every day except 1/2 tablet on Monday and Friday.

## 2010-05-16 NOTE — Assessment & Plan Note (Signed)
Summary: 3 mth fu---stc   Vital Signs:  Patient profile:   60 year old female Height:      62 inches (157.48 cm) Weight:      262 pounds (119.09 kg) BMI:     48.09 O2 Sat:      95 % on Room air Temp:     97.5 degrees F (36.39 degrees C) oral Pulse rate:   63 / minute BP sitting:   138 / 66  (left arm) Cuff size:   small wrist cuff  Vitals Entered By: Brenton Grills CMA Duncan Dull) (March 17, 2010 9:03 AM)  O2 Flow:  Room air CC: 3 month F/U/pt is no longer taking Ampicillin/aj Is Patient Diabetic? Yes   Referring Oliviarose Punch:  Rollene Rotunda Primary Shaniece Bussa:  Minus Breeding MD  CC:  3 month F/U/pt is no longer taking Ampicillin/aj.  History of Present Illness: pt still takes 30 units of insulin two times a day.  pt states she feels well in general.  no cbg record, but states cbg's are sometimes mildly low if she misses breakfast and breakfast insulin dose.    Current Medications (verified): 1)  Adult Aspirin Low Strength 81 Mg  Tbdp (Aspirin) .... Take 1 By Mouth Qd 2)  Multivitamins   Tabs (Multiple Vitamin) .... Take 1 By Mouth Qd 3)  Ziac 10-6.25 Mg  Tabs (Bisoprolol-Hydrochlorothiazide) .... Take 1 By Mouth Qd 4)  Enalapril Maleate 20 Mg  Tabs (Enalapril Maleate) .... Take 1 By Mouth Two Times A Day Qd 5)  Lasix 20 Mg  Tabs (Furosemide) .Marland Kitchen.. 1 By Mouth Every Other Day 6)  Hydralazine Hcl 25 Mg  Tabs (Hydralazine Hcl) .Marland Kitchen.. 1 Twice A Day 7)  Vitamin D 1000 Unit Tabs (Cholecalciferol) .... Take 1 By Mouth Qd 8)  Fish Oil 1000 Mg Caps (Omega-3 Fatty Acids) .... Take 1 By Mouth Qd 9)  Coumadin 5 Mg Tabs (Warfarin Sodium) .... Take As Directed By Anticoagulation Clinic 10)  Simvastatin 40 Mg Tabs (Simvastatin) .Marland Kitchen.. 1 Once Daily 11)  Humulin 70/30 70-30 % Susp (Insulin Isophane & Regular) .... 30 Units With Breakfast, and 25 With Evening Meal). 12)  Vitamin C 500 Mg Chew (Ascorbic Acid) .Marland Kitchen.. 1 By Mouth Dialy 13)  Metrogel-Vaginal 0.75 % Gel (Metronidazole) .... As Directed 14)   Cerave  Crea (Emollient) .... As Directed 15)  Screening Mammography 16)  Ampicillin 500 Mg Caps (Ampicillin) .Marland Kitchen.. 1 By Mouth Once Daily  Allergies (verified): No Known Drug Allergies  Past History:  Past Medical History: Last updated: 12/03/2008 CHF (ICD-428.0) (EF 25%) NEED PROPHYLACTIC VACCINATION&INOCULATION FLU (ICD-V04.81) CHOLELITHIASIS (ICD-574.20) MORBID OBESITY (ICD-278.01) DIABETIC  RETINOPATHY (ICD-250.50) NEPHROPATHY, DIABETIC (ICD-250.40) HYPERLIPIDEMIA (ICD-272.4) DIABETES MELLITUS, TYPE II (ICD-250.00) DEPRESSION (ICD-311) CAD CVA  Review of Systems  The patient denies syncope.    Physical Exam  General:  morbidly obese.  no distress  Pulses:  dorsalis pedis intact bilat.   Extremities:  no deformity.  no ulcer on the feet.  feet are of normal color and temp.  no edema.  there is a healed surgical (vein harvest) scar at the right leg.  Neurologic:  sensation is intact to touch on the feet, but decrased from normal.   Impression & Recommendations:  Problem # 1:  DIABETES MELLITUS, TYPE II (ICD-250.00) overcontrolled, given this regimen, which does match insulin to her changing needs throughout the day very well.  Other Orders: TLB-A1C / Hgb A1C (Glycohemoglobin) (83036-A1C) Est. Patient Level III ([address])  Patient Instructions: 1)  blood tests are being ordered for you today.  please call [phone] to hear your test results. 2)  pending the test results, please reduce insulin to 30 units with the first meal of the day, and 25 with the last meal). 3)  Please schedule a follow-up appointment in 3 months.   Orders Added: 1)  TLB-A1C / Hgb A1C (Glycohemoglobin) [83036-A1C] 2)  Est. Patient Level III [95638]

## 2010-05-16 NOTE — Letter (Signed)
Summary: [facility]inquent Coumadin 1  Coumadin  [address]   [place], [place] [address]   Phone: [phone]  Fax: [phone]     June 09, 2009 MRN: [id number]   Sherald [name] 2-P [address] [place], [place]  [address]   Dear [name],  This letter is being sent to you as a reminder that it is necessary for you to get your INR/PT checked regularly so that we can optimize your care.  Our records indicate that you were scheduled to have a test done recently.  As of today, we have not received the results of this test.  It is very important that you have your INR checked.  Please call our office at the number listed above to schedule an appointment at your earliest convenience.    If you have recently had your protime checked or have discontinued this medication, please contact our office at the above phone number to clarify this issue.  Thank you for this prompt attention to this important health care matter.  Sincerely,   [facility] Team

## 2010-05-16 NOTE — Assessment & Plan Note (Signed)
Summary: MEDICARE WELLNESS/NWS  #   Vital Signs:  Patient profile:   60 year old female Height:      62 inches (157.48 cm) Weight:      261.13 pounds (118.70 kg) BMI:     47.93 O2 Sat:      96 % on Room air Temp:     98.2 degrees F (36.78 degrees C) oral Pulse rate:   54 / minute BP sitting:   160 / 53  (left arm) Cuff size:   small (wrist)  Vitals Entered By: Brenton Grills MA (November 04, 2009 8:25 AM)  O2 Flow:  Room air CC: Medicare Wellness/aj Is Patient Diabetic? Yes Comments Pt is due for a colonoscopy, and due for mammogram and pap  Vision Screening:Left eye with correction: 20 / 50 Right eye with correction: 20 / 30 Both eyes with correction: 20 / 40        Vision Entered By: Brenton Grills MA (November 04, 2009 8:58 AM)   Referring Provider:  Rollene Rotunda Primary Provider:  Minus Breeding MD  CC:  Medicare Wellness/aj.  History of Present Illness: pt is here for medicare welllness visit.  she denies memory loss.  she says she is able to perform activities of daily living without assistance.  she has no limitations to physical activity. pt just increased rx for bp and cholesterol yeaterday.   she says depression is much better recently.  Current Medications (verified): 1)  Adult Aspirin Low Strength 81 Mg  Tbdp (Aspirin) .... Take 1 By Mouth Qd 2)  Multivitamins   Tabs (Multiple Vitamin) .... Take 1 By Mouth Qd 3)  Ziac 10-6.25 Mg  Tabs (Bisoprolol-Hydrochlorothiazide) .... Take 1 By Mouth Qd 4)  Enalapril Maleate 20 Mg  Tabs (Enalapril Maleate) .... Take 1 By Mouth Two Times A Day Qd 5)  Lasix 20 Mg  Tabs (Furosemide) .Marland Kitchen.. 1 By Mouth Every Other Day 6)  Hydralazine Hcl 25 Mg  Tabs (Hydralazine Hcl) .Marland Kitchen.. 1 Twice A Day 7)  Vitamin D 1000 Unit Tabs (Cholecalciferol) .... Take 1 By Mouth Qd 8)  Fish Oil 1000 Mg Caps (Omega-3 Fatty Acids) .... Take 1 By Mouth Qd 9)  Coumadin 5 Mg Tabs (Warfarin Sodium) .... Take As Directed By Anticoagulation Clinic 10)   Simvastatin 40 Mg Tabs (Simvastatin) .Marland Kitchen.. 1 Once Daily 11)  Humulin 70/30 70-30 % Susp (Insulin Isophane & Regular) .... 30 Units Two Times A Day 12)  Vitamin C 500 Mg Chew (Ascorbic Acid) .Marland Kitchen.. 1 By Mouth Dialy 13)  Metrogel-Vaginal 0.75 % Gel (Metronidazole) .... As Directed 14)  Cerave  Crea (Emollient) .... As Directed  Allergies (verified): No Known Drug Allergies  Past History:  Past Medical History: Last updated: 12/03/2008 CHF (ICD-428.0) (EF 25%) NEED PROPHYLACTIC VACCINATION&INOCULATION FLU (ICD-V04.81) CHOLELITHIASIS (ICD-574.20) MORBID OBESITY (ICD-278.01) DIABETIC  RETINOPATHY (ICD-250.50) NEPHROPATHY, DIABETIC (ICD-250.40) HYPERLIPIDEMIA (ICD-272.4) DIABETES MELLITUS, TYPE II (ICD-250.00) DEPRESSION (ICD-311) CAD CVA  Family History: Reviewed history from 08/11/2008 and no changes required. Mother died at age 66 with hypertension, diabetes, heart disease.  Father died at age 23 with hypertension, cerebrovascular accident. The patient has two brothers who are alive and well, two sisters who are alive and well.  Her daughter is age 36 and is well.  Social History: Reviewed history from 08/11/2008 and no changes required.  The patient has been under stress..  The patient moved to [facility] to live with her daughter who is her only daughter.  The patient has been teaching third  grade.She has moved into an apartment on the second floor and had to use stairs frequently.  The patient does not use tobacco or alcohol nor does she exercise. no illegal drugs.  Review of Systems  The patient denies decreased hearing.         she thinks she needs new glasses.  Physical Exam  General:  morbidly obese.  no distress  Ears:  normal hearing.   Rectal:  normal external and internal exam.  heme neg  Genitalia:  Pelvic Exam:        External: normal female genitalia without lesions or masses        Vagina: normal without lesions or masses        Cervix: normal without  lesions or masses        Adnexa: normal bimanual exam without masses or fullness        Uterus: normal by palpation        Pap smear: performed Msk:  pt easily and quickly performs "get-up-and-go" from a sitting position  Psych:  remembers 3/3 at 5 minutes.  excellent recall.  can easily read and write a sentence.  alert and oriented x 3    Impression & Recommendations:  Problem # 1:  ROUTINE GENERAL MEDICAL EXAM@HEALTH  CARE FACL (ICD-V70.0)  Problem # 2:  DEPRESSION (ICD-311) Assessment: Improved  Medications Added to Medication List This Visit: 1)  Screening Mammography   Other Orders: Pneumococcal Vaccine Ped < 78yrs (60454) Admin 1st Vaccine (09811) First annual wellness visit with prevention plan  (B1478)  Preventive Care Screening     colonoscopy is refused 2011   Patient Instructions: 1)  please consider these measures for your health:  minimize alcohol.  do not use tobacco products.  have a colonoscopy at least every 10 years from age 42.  keep firearms safely stored.  always use seat belts.  have working smoke alarms in your home.  see an eye doctor and dentist regularly.  never drive under the influence of alcohol or drugs (including prescription drugs).  those with fair skin should take precautions against the sun. 2)  please let me know what your wishes would be, if artificial life support measures should become necessary.  it is critically important to prevent falling down (keep floor areas well-lit, dry, and free of loose objects) 3)  it is critically important to maintain a healthy body weight.  i would be happy to refer you to a dietician.   4)  return 6 weeks. 5)  here is a prescription for a mammogram Prescriptions: SCREENING MAMMOGRAPHY   #0 x 0   Entered and Authorized by:   Minus Breeding MD   Signed by:   Minus Breeding MD on 11/04/2009   Method used:   Print then Give to Patient   RxID:   [id number]    Preventive Care Screening      colonoscopy is refused 2011   Immunizations Administered:  Pediatric Pneumococcal Vaccine:    Vaccine Type: Prevnar    Site: right deltoid    Mfr: Merck    Dose: 0.5 ml    Route: IM    Given by: Brenton Grills MA    Exp. Date: 04/15/2011    Lot #: [id number]    VIS given: 03/25/07 version given November 04, 2009.

## 2010-05-16 NOTE — Medication Information (Signed)
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 no    Recent/future dental: no  Any missed doses?: no       Is patient compliant with meds? yes      Comments: Pt c/o of grabbing chest pain, occassionally, when laying down, that occurs at least once a day. Pt was advised to inform PCP.   Allergies: No Known Drug Allergies  Anticoagulation Management History:      The patient is taking warfarin and comes in today for a routine follow up visit.  Positive risk factors for bleeding include history of CVA/TIA and presence of serious comorbidities.  Negative risk factors for bleeding include an age less than 86 years old.  The bleeding index is 'intermediate risk'.  Positive CHADS2 values include History of CHF, History of HTN, History of Diabetes, and Prior Stroke/CVA/TIA.  Negative CHADS2 values include Age > 56 years old.  The start date was 04/16/2000.  Her last INR was 2.3 ratio.  Anticoagulation responsible [name]: Nahser.  INR POC: 2.4.  Cuvette Lot#: [id number].  Exp: 03/2011.    Anticoagulation Management Assessment/Plan:      The patient's current anticoagulation dose is Coumadin 5 mg tabs: Take as directed by Anticoagulation Clinic.  The target INR is 2 - 3.  The next INR is due 03/31/2010.  Anticoagulation instructions were given to patient.  Results were reviewed/authorized by Weston Brass, PharmD.  She was notified by Hoy Register, PharmD Candidate.         Prior Anticoagulation Instructions: INR 2.4  Continue same dose of 1 tablet every day except 1/2  tablet on Monday and Friday.  Recheck INR in 4 weeks.   Current Anticoagulation Instructions: INR 2.4 Continue previous dose of 1 tablet everyday except 0.5 tablets on Monday and Friday Recheck INR in 4 weeks

## 2010-05-16 NOTE — Medication Information (Signed)
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 yes       Details: Change Hydralazine from 1/2 tab TID to 1 tab BID. Also on Metrogel topical and will also start ABX from Dermatologist, Amphicillin 500mg  daily for 30 days.    Any missed doses?: no       Is patient compliant with meds? yes       Allergies: No Known Drug Allergies  Anticoagulation Management History:      The patient is taking warfarin and comes in today for a routine follow up visit.  Positive risk factors for bleeding include history of CVA/TIA and presence of serious comorbidities.  Negative risk factors for bleeding include an age less than 25 years old.  The bleeding index is 'intermediate risk'.  Positive CHADS2 values include History of CHF, History of Diabetes, and Prior Stroke/CVA/TIA.  Negative CHADS2 values include Age > 33 years old.  The start date was 04/16/2000.  Her last INR was 2.3 ratio.  Anticoagulation responsible provider: Shirlee Latch MD, Zaki Gertsch.  INR POC: 2.3.  Cuvette Lot#: [id number].  Exp: 01/2011.    Anticoagulation Management Assessment/Plan:      The patient's current anticoagulation dose is Coumadin 5 mg tabs: Take as directed by Anticoagulation Clinic.  The target INR is 2 - 3.  The next INR is due 11/30/2009.  Anticoagulation instructions were given to patient.  Results were reviewed/authorized by Bethena Midget, RN, BSN.  She was notified by Bethena Midget, RN, BSN.         Prior Anticoagulation Instructions: INR 2.1 Continue 5mg s daily except 2.5mg s on Mondays and Fridays. Recheck in 4 weeks.    Current Anticoagulation Instructions: INR 2.3 Continue 5mg s everyday except 2.5mg s on Mondays and Fridays. Recheck in 4 weeks.

## 2010-05-16 NOTE — Progress Notes (Signed)
Summary: Rx req  ---- Converted from flag ---- ---- 10/05/2009 1:24 PM, Verdell Face wrote: Pt needs refill by tomorrow am, pt made appt for next avial which is 6/24 Needs her insulin/[facility]...  [phone] [name] ------------------------------ 1 more fill authorized with Pharmacist.Pt needs a total of 2 vials - 20 for a 30 day period. Pt informed. Margaret Pyle, CMA  October 05, 2009 1:51 PM

## 2010-05-16 NOTE — Progress Notes (Signed)
  Phone Note Refill Request Message from:  Fax from Pharmacy  Refills Requested: Medication #1:  NOVOLIN 70/30 70-30 %  SUSP 30 units twice a day   Dosage confirmed as above?Dosage Confirmed  Method Requested: Fax to Local Pharmacy Initial call taken by: Brenton Grills MA,  October 24, 2009 2:44 PM  Follow-up for Phone Call        Pharmacy sent fax requesting Novolin 70/30 be changed to Humulin 70/30 Please Advise  Additional Follow-up for Phone Call Additional follow up Details #1::        sent Additional Follow-up by: Minus Breeding MD,  October 24, 2009 2:56 PM    New/Updated Medications: HUMULIN 70/30 70-30 % SUSP (INSULIN ISOPHANE & REGULAR) 30 units two times a day Prescriptions: HUMULIN 70/30 70-30 % SUSP (INSULIN ISOPHANE & REGULAR) 30 units two times a day  #2 vials x 11   Entered and Authorized by:   Minus Breeding MD   Signed by:   Minus Breeding MD on 10/24/2009   Method used:   Electronically to        [facility]  [phone]* (retail)       [address]       [place], [place]  [address]       Ph: [phone] or [phone]       Fax: [phone]   RxID:   [id number]

## 2010-05-16 NOTE — Assessment & Plan Note (Signed)
Summary: Cardiology Nuclear Testing  Nuclear Med Background Indications for Stress Test: Evaluation for Ischemia, Graft Patency   History: CABG, Echo, Heart Catheterization, Myocardial Perfusion Study  History Comments: '01 CABG X 5; '05 VWU:JWJX infero-anterior ischemia, EF=38%; '09 Echo:EF=20-25%; h/o CHF  Symptoms: Chest Tightness, DOE  Symptoms Comments: Last episode of CP:2 days ago.   Nuclear Pre-Procedure Cardiac Risk Factors: CVA, History of Smoking, Hypertension, IDDM Type 2, LBBB, Lipids, Obesity Caffeine/Decaff Intake: none NPO After: 12:00 AM Lungs: Clear.  O2 Sat 98% on RA. IV 0.9% NS with Angio Cath: 24g     IV Site: Rt Hand IV Started by: Bonnita Levan RN Chest Size (in) 40     Cup Size C     Height (in): 62 Weight (lb): 265 BMI: 48.64 Tech Comments: Last dose Ziac:last night  Nuclear Med Study 1 or 2 day study:  2 day     Stress Test Type:  Adenosine Reading MD:  Marca Ancona, MD     Referring MD:  Everlean Alstrom, MD Resting Radionuclide:  Technetium 62m Tetrofosmin     Resting Radionuclide Dose:  33 mCi  Stress Radionuclide:  Technetium 94m Tetrofosmin     Stress Radionuclide Dose:  33 mCi   Stress Protocol  Dose of Adenosine:  60.0 mg    Stress Test Technologist:  Rea [employer] CMA-N     Nuclear Technologist:  Doyne Keel  Rest Procedure  Myocardial perfusion imaging was performed at rest 45 minutes following the intravenous administration of Myoview Technetium 62m Tetrofosmin.  Stress Procedure  The patient received IV adenosine at 140 mcg/kg/min for 4 minutes. There were episodes of 2nd degree AVB with infusion.  She did c/o chest tightness with infusion.  Myoview was injected at the 2 minute mark and quantitative spect images were obtained after a 45 minute delay.  QPS Raw Data Images:  Normal; no motion artifact; normal heart/lung ratio. Stress Images:  Moderate inferior wall perfusion defect from base to apex.  Milder mid to apical anterior  perfusion defect.  Rest Images:  Moderate basal to mid inferior perfusion defect.  Mild mid anterior perfusion defect.  Subtraction (SDS):  Fixed basal to mid inferior perfusion defect suggests infarction.  There is evidence for some ischemia in the apical inferior wall and the apical anterior wall.  Transient Ischemic Dilatation:  1.04  (Normal <1.22)  Lung/Heart Ratio:  .32  (Normal <0.45)  Quantitative Gated Spect Images QGS EDV:  204 ml QGS ESV:  132 ml QGS EF:  35 % QGS cine images:  Severe septal hypokinesis.    Overall Impression  Exercise Capacity: Adenosine study with no exercise. BP Response: Normal blood pressure response. Clinical Symptoms: Chest tight, nausea ECG Impression: Baseline LBBB.  Brief 2nd degree heart block with adenosine infusion.  Overall Impression: Fixed basal to mid inferior perfusion defect suggests infarction.  Reversible apical inferior and apical anterior perfusion defects suggest the presence of some ischemia.  Overall Impression Comments: Moderate systolic dysfunction.   Appended Document: Cardiology Nuclear Testing Low risk scan.  She needs continued risk reduction and medical mangement.  See me in six months.  Appended Document: Cardiology Nuclear Testing pt aware of results

## 2010-05-16 NOTE — Letter (Signed)
Summary: Handout Printed  Printed Handout:  - Coumadin Instructions-w/out Meds 

## 2010-05-16 NOTE — Miscellaneous (Signed)
  Clinical Lists Changes  Observations: Added new observation of US CAROTID: 0-39% bilateral ICA stenosis  (12/22/2008 14:51)      Carotid Doppler  Procedure date:  12/22/2008  Findings:      0-39% bilateral ICA stenosis

## 2010-05-16 NOTE — Medication Information (Signed)
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 no    Recent/future dental: no  Any missed doses?: no       Is patient compliant with meds? yes       Allergies: No Known Drug Allergies  Anticoagulation Management History:      The patient is taking warfarin and comes in today for a routine follow up visit.  Positive risk factors for bleeding include history of CVA/TIA and presence of serious comorbidities.  Negative risk factors for bleeding include an age less than 69 years old.  The bleeding index is 'intermediate risk'.  Positive CHADS2 values include History of CHF, History of Diabetes, and Prior Stroke/CVA/TIA.  Negative CHADS2 values include Age > 44 years old.  The start date was 04/16/2000.  Her last INR was 2.3 ratio.  Anticoagulation responsible provider: Riley Kill MD, Maisie Fus.  INR POC: 2.8.  Cuvette Lot#: [id number].  Exp: 08/2010.    Anticoagulation Management Assessment/Plan:      The patient's current anticoagulation dose is Coumadin 5 mg tabs: Take as directed by Anticoagulation Clinic.  The target INR is 2 - 3.  The next INR is due 07/12/2009.  Anticoagulation instructions were given to patient.  Results were reviewed/authorized by Eda Keys, PharmD.  She was notified by Eda Keys.         Prior Anticoagulation Instructions: INR 2.0  Continue 0.5 tab on Monday and Friday and 1 tab on all other days.   Recheck in 4 weeks.  May want to try clotrimazole OTC cream to spots 2 - 3 times daily to see if they improve.  This is available over  the counter.  Use only a very small amount.    Current Anticoagulation Instructions: INR 2.8  Continue current dosing schedule of 1/2 tablet on Monday and Friday and 1 tablet all other days.  Return to clinic in 4 weeks.

## 2010-05-16 NOTE — Medication Information (Signed)
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 no    Recent/future dental: no  Any missed doses?: yes     Details: Missed one dose last week.  Is patient compliant with meds? yes       Allergies: No Known Drug Allergies  Anticoagulation Management History:      The patient is taking warfarin and comes in today for a routine follow up visit.  Positive risk factors for bleeding include history of CVA/TIA and presence of serious comorbidities.  Negative risk factors for bleeding include an age less than 2 years old.  The bleeding index is 'intermediate risk'.  Positive CHADS2 values include History of CHF, History of HTN, History of Diabetes, and Prior Stroke/CVA/TIA.  Negative CHADS2 values include Age > 55 years old.  The start date was 04/16/2000.  Her last INR was 2.3 ratio.  Anticoagulation responsible provider: Clifton James MD, Cristal Deer.  INR POC: 1.5.  Cuvette Lot#: [id number].  Exp: 02/2011.    Anticoagulation Management Assessment/Plan:      The patient's current anticoagulation dose is Coumadin 5 mg tabs: Take as directed by Anticoagulation Clinic.  The target INR is 2 - 3.  The next INR is due 02/01/2010.  Anticoagulation instructions were given to patient.  Results were reviewed/authorized by Weston Brass, PharmD.  She was notified by Ilean Skill D candidate.         Prior Anticoagulation Instructions: INR 2.3  Continue same dose of 1 tablet every day except 1/2 tablet on Monday and Friday.   Current Anticoagulation Instructions: INR 1.5  Take 1 1/2 tablets today  and tomorrow. Then continue same dose of 1 tablet everyday except 1/2 tablet on Monday and Friday. Recheck in 2 weeks.

## 2010-05-16 NOTE — Assessment & Plan Note (Signed)
Summary: 6 WK FU  STC   Vital Signs:  Patient profile:   60 year old female Height:      62 inches (157.48 cm) Weight:      263.50 pounds (119.77 kg) BMI:     48.37 O2 Sat:      95 % on Room air Temp:     97.9 degrees F (36.61 degrees C) oral Pulse rate:   61 / minute BP sitting:   144 / 66  (left arm) Cuff size:   SMALL wRIST  Vitals Entered By: Jarome Lamas (December 16, 2009 8:23 AM)  O2 Flow:  Room air CC: 6 week F/U/aj Is Patient Diabetic? Yes   Referring Oather Muilenburg:  Rollene Rotunda Primary Laquida Cotrell:  Minus Breeding MD  CC:  6 week F/U/aj.  History of Present Illness: no cbg record, but states cbg's are mostly in the low-100's.  it is higher later in the day than in am.  pt states she feels well in general.    Current Medications (verified): 1)  Adult Aspirin Low Strength 81 Mg  Tbdp (Aspirin) .... Take 1 By Mouth Qd 2)  Multivitamins   Tabs (Multiple Vitamin) .... Take 1 By Mouth Qd 3)  Ziac 10-6.25 Mg  Tabs (Bisoprolol-Hydrochlorothiazide) .... Take 1 By Mouth Qd 4)  Enalapril Maleate 20 Mg  Tabs (Enalapril Maleate) .... Take 1 By Mouth Two Times A Day Qd 5)  Lasix 20 Mg  Tabs (Furosemide) .Marland Kitchen.. 1 By Mouth Every Other Day 6)  Hydralazine Hcl 25 Mg  Tabs (Hydralazine Hcl) .Marland Kitchen.. 1 Twice A Day 7)  Vitamin D 1000 Unit Tabs (Cholecalciferol) .... Take 1 By Mouth Qd 8)  Fish Oil 1000 Mg Caps (Omega-3 Fatty Acids) .... Take 1 By Mouth Qd 9)  Coumadin 5 Mg Tabs (Warfarin Sodium) .... Take As Directed By Anticoagulation Clinic 10)  Simvastatin 40 Mg Tabs (Simvastatin) .Marland Kitchen.. 1 Once Daily 11)  Humulin 70/30 70-30 % Susp (Insulin Isophane & Regular) .... 30 Units Two Times A Day 12)  Vitamin C 500 Mg Chew (Ascorbic Acid) .Marland Kitchen.. 1 By Mouth Dialy 13)  Metrogel-Vaginal 0.75 % Gel (Metronidazole) .... As Directed 14)  Cerave  Crea (Emollient) .... As Directed 15)  Screening Mammography 16)  Ampicillin 500 Mg Caps (Ampicillin) .Marland Kitchen.. 1 By Mouth Once Daily  Allergies (verified): No  Known Drug Allergies  Past History:  Past Medical History: Last updated: 12/03/2008 CHF (ICD-428.0) (EF 25%) NEED PROPHYLACTIC VACCINATION&INOCULATION FLU (ICD-V04.81) CHOLELITHIASIS (ICD-574.20) MORBID OBESITY (ICD-278.01) DIABETIC  RETINOPATHY (ICD-250.50) NEPHROPATHY, DIABETIC (ICD-250.40) HYPERLIPIDEMIA (ICD-272.4) DIABETES MELLITUS, TYPE II (ICD-250.00) DEPRESSION (ICD-311) CAD CVA  Review of Systems  The patient denies hypoglycemia.    Physical Exam  Skin:  insulin injection sites at anterior abdomen are normal, except for a few ecchymoses. Additional Exam:   Hemoglobin A1C            6.5 %       Impression & Recommendations:  Problem # 1:  DIABETES MELLITUS, TYPE II (ICD-250.00) overcontrolled  Medications Added to Medication List This Visit: 1)  Humulin 70/30 70-30 % Susp (Insulin isophane & regular) .... 30 units with breakfast, and 25 with evening meal). 2)  Ampicillin 500 Mg Caps (Ampicillin) .Marland Kitchen.. 1 by mouth once daily  Other Orders: TLB-A1C / Hgb A1C (Glycohemoglobin) (83036-A1C) Est. Patient Level III (50093)  Patient Instructions: 1)  blood tests are being ordered for you today.  please call [phone] to hear your test results. 2)  pending the test  results, please continue the same medications for now 3)  Please schedule a follow-up appointment in 3 months. 4)  (update: i left message on phone-tree:  reduce insulin to 30 units am and 25 with evening meal).

## 2010-05-16 NOTE — Medication Information (Signed)
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 no    Recent/future dental: no  Any missed doses?: no       Is patient compliant with meds? yes       Allergies (verified): No Known Drug Allergies  Anticoagulation Management History:      The patient is taking warfarin and comes in today for a routine follow up visit.  Positive risk factors for bleeding include history of CVA/TIA and presence of serious comorbidities.  Negative risk factors for bleeding include an age less than 32 years old.  The bleeding index is 'intermediate risk'.  Positive CHADS2 values include History of CHF, History of Diabetes, and Prior Stroke/CVA/TIA.  Negative CHADS2 values include Age > 78 years old.  The start date was 04/16/2000.  Her last INR was 2.3 ratio.  Anticoagulation responsible provider: Shirlee Latch MD, Graziella Connery.  INR POC: 2.3.  Cuvette Lot#: [id number].  Exp: 08/2010.    Anticoagulation Management Assessment/Plan:      The patient's current anticoagulation dose is Coumadin 5 mg tabs: Take as directed by Anticoagulation Clinic.  The target INR is 2 - 3.  The next INR is due 08/09/2009.  Anticoagulation instructions were given to patient.  Results were reviewed/authorized by Cloyde Reams, RN, BSN.  She was notified by Cloyde Reams RN.         Prior Anticoagulation Instructions: INR 2.8  Continue current dosing schedule of 1/2 tablet on Monday and Friday and 1 tablet all other days.  Return to clinic in 4 weeks.   Current Anticoagulation Instructions: INR 2.3  Continue on same dosage 1 tablet daily except 1/2  tablet on Mondays and Fridays.  Recheck in 4 weeks.

## 2010-05-16 NOTE — Medication Information (Signed)
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 no    Recent/future dental: no  Any missed doses?: no       Is patient compliant with meds? yes       Current Medications (verified): 1)  Adult Aspirin Low Strength 81 Mg  Tbdp (Aspirin) .... Take 1 By Mouth Qd 2)  Multivitamins   Tabs (Multiple Vitamin) .... Take 1 By Mouth Qd 3)  Ziac 10-6.25 Mg  Tabs (Bisoprolol-Hydrochlorothiazide) .... Take 1 By Mouth Qd 4)  Enalapril Maleate 20 Mg  Tabs (Enalapril Maleate) .... Take 1 By Mouth Two Times A Day Qd 5)  Novolin 70/30 70-30 %  Susp (Insulin Isophane & Regular) .... 35 Units Twice A Day 6)  Lasix 20 Mg  Tabs (Furosemide) .... Every Other Day 7)  Hydralazine Hcl 25 Mg  Tabs (Hydralazine Hcl) .... Take 1/2  By Mouth Three Times A Day 8)  Pravachol 40 Mg Tabs (Pravastatin Sodium) .... Take 2 At Bedtime 9)  Vitamin D 1000 Unit Tabs (Cholecalciferol) .... Take 1 By Mouth Qd 10)  Fish Oil 1000 Mg Caps (Omega-3 Fatty Acids) .... Take 1 By Mouth Qd 11)  Coumadin 5 Mg Tabs (Warfarin Sodium) .... Take As Directed By Anticoagulation Clinic  Allergies (verified): No Known Drug Allergies  Anticoagulation Management History:      The patient is taking warfarin and comes in today for a routine follow up visit.  Positive risk factors for bleeding include history of CVA/TIA and presence of serious comorbidities.  Negative risk factors for bleeding include an age less than 50 years old.  The bleeding index is 'intermediate risk'.  Positive CHADS2 values include History of CHF, History of Diabetes, and  Prior Stroke/CVA/TIA.  Negative CHADS2 values include Age > 58 years old.  The start date was 04/16/2000.  Her last INR was 2.3 ratio.  Anticoagulation responsible provider: Juanda Chance MD, Smitty Cords.  INR POC: 2.0.  Cuvette Lot#: [id number].  Exp: 05/2010.    Anticoagulation Management Assessment/Plan:      The patient's current anticoagulation dose is Coumadin 5 mg tabs: Take as directed by Anticoagulation Clinic.  The target INR is 2 - 3.  The next INR is due 05/17/2009.  Anticoagulation instructions were given to patient.  Results were reviewed/authorized by Shelby Dubin, PharmD, BCPS, CPP.  She was notified by Shelby Dubin PharmD, BCPS, CPP.         Prior Anticoagulation Instructions: INR 2.8  Continue on same dosage 1 tablet daily except 1/2 tablet on Mondays and Fridays.  Recheck in 4 weeks.    Current Anticoagulation Instructions: INR 2.0  Continue 0.5 tab on Monday and Friday and 1 tab on all other days.   Recheck in 4 weeks.  May want to try clotrimazole OTC cream to spots 2 - 3 times daily to see if they improve.  This is available over the counter.  Use only a very small amount.

## 2010-05-18 NOTE — Letter (Signed)
Summary: [facility]  [address]   [place], [place] [address]   Phone: [phone]  Fax: [phone]     May 10, 2010 MRN: [id number]   [facility] [name] [address]-P [address] [place], [place]  [address]   Dear [name],  This letter is being sent to you as a reminder that it is necessary for you to get your INR/PT checked regularly so that we can optimize your care.  Our records indicate that you were scheduled to have a test done recently.  As of today, we have not received the results of this test.  It is very important that you have your INR checked.  Please call our office at the number listed above to schedule an appointment at your earliest convenience.    If you have recently had your protime checked or have discontinued this medication, please contact our office at the above phone number to clarify this issue.  Thank you for this prompt attention to this important health care matter.  Sincerely,   [facility] Team

## 2010-05-24 NOTE — Medication Information (Signed)
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 no    Recent/future dental: no  Any missed doses?: no       Is patient compliant with meds? yes       Allergies: No Known Drug Allergies  Anticoagulation Management History:      The patient is taking warfarin and comes in today for a routine follow up visit.  Positive risk factors for bleeding include history of CVA/TIA and presence of serious comorbidities.  Negative risk factors for bleeding include an age less than 62 years old.  The bleeding index is 'intermediate risk'.  Positive CHADS2 values include History of CHF, History of HTN, History of Diabetes, and Prior Stroke/CVA/TIA.  Negative CHADS2 values include Age > 60 years old.  The start date was 04/16/2000.  Her last INR was 2.3 ratio.  Anticoagulation responsible provider: Caiya Bettes.  INR POC: 1.8.  Cuvette Lot#: [id number].  Exp: 04/2011.    Anticoagulation Management Assessment/Plan:      The patient's current anticoagulation dose is Coumadin 5 mg tabs: Take as directed by Anticoagulation Clinic.  The target INR is 2 - 3.  The next INR is due 06/12/2010.  Anticoagulation instructions were given to patient.  Results were reviewed/authorized by Cloyde Reams, RN, BSN.  She was notified by Cloyde Reams, RN, BSN.         Prior Anticoagulation Instructions: INR 2.4 Continue previous dose of 1 tablet everyday except 0.5 tablets on Monday and Friday Recheck INR in 4 weeks  Current Anticoagulation Instructions: INR 1.8  Take 1 tablet today then resume same dosage 1 tablet daily except 1/2  tablet on Mondays and Fridays.  Recheck in 4 weeks.

## 2010-06-09 ENCOUNTER — Encounter: Payer: Self-pay | Admitting: Cardiology

## 2010-06-09 DIAGNOSIS — I6350 Cerebral infarction due to unspecified occlusion or stenosis of unspecified cerebral artery: Secondary | ICD-10-CM

## 2010-06-20 ENCOUNTER — Ambulatory Visit: Payer: Self-pay | Admitting: Endocrinology

## 2010-06-30 ENCOUNTER — Ambulatory Visit (INDEPENDENT_AMBULATORY_CARE_PROVIDER_SITE_OTHER): Payer: Medicare Other | Admitting: Endocrinology

## 2010-06-30 ENCOUNTER — Encounter: Payer: Self-pay | Admitting: Endocrinology

## 2010-06-30 ENCOUNTER — Other Ambulatory Visit: Payer: Self-pay | Admitting: Endocrinology

## 2010-06-30 ENCOUNTER — Other Ambulatory Visit: Payer: Medicare Other

## 2010-06-30 DIAGNOSIS — E119 Type 2 diabetes mellitus without complications: Secondary | ICD-10-CM

## 2010-07-04 NOTE — Assessment & Plan Note (Signed)
Summary: 3 MTH FU--NWS   Vital Signs:  Patient profile:   60 year old female Height:      62 inches (157.48 cm) Weight:      269.13 pounds (122.33 kg) BMI:     49.40 O2 Sat:      92 % on Room air Temp:     98.3 degrees F (36.83 degrees C) oral Pulse rate:   66 / minute Pulse rhythm:   regular BP sitting:   126 / 82  (right arm) Cuff size:   large  Vitals Entered By: Brenton Grills CMA Duncan Dull) (June 30, 2010 8:34 AM)  O2 Flow:  Room air CC: 3 month F/U/aj Is Patient Diabetic? Yes Comments Pt has never had a mammogram or a colonoscopy   Referring Provider:  Rollene Rotunda Primary Provider:  Minus Breeding MD  CC:  3 month F/U/aj.  History of Present Illness: pt states she feels well in general. she does not check cbg's.  no cbg record, but states cbg's are sometimes mildly low if she misses breakfast and breakfast insulin dose.    Current Medications (verified): 1)  Adult Aspirin Low Strength 81 Mg  Tbdp (Aspirin) .... Take 1 By Mouth Qd 2)  Multivitamins   Tabs (Multiple Vitamin) .... Take 1 By Mouth Qd 3)  Ziac 10-6.25 Mg  Tabs (Bisoprolol-Hydrochlorothiazide) .... Take 1 By Mouth Qd 4)  Enalapril Maleate 20 Mg  Tabs (Enalapril Maleate) .... Take 1 By Mouth Two Times A Day Qd 5)  Lasix 20 Mg  Tabs (Furosemide) .Marland Kitchen.. 1 By Mouth Every Other Day 6)  Hydralazine Hcl 25 Mg  Tabs (Hydralazine Hcl) .Marland Kitchen.. 1 Twice A Day 7)  Vitamin D 1000 Unit Tabs (Cholecalciferol) .... Take 1 By Mouth Qd 8)  Fish Oil 1000 Mg Caps (Omega-3 Fatty Acids) .... Take 1 By Mouth Qd 9)  Coumadin 5 Mg Tabs (Warfarin Sodium) .... Take As Directed By Anticoagulation Clinic 10)  Simvastatin 40 Mg Tabs (Simvastatin) .Marland Kitchen.. 1 Once Daily 11)  Humulin 70/30 70-30 % Susp (Insulin Isophane & Regular) .... 30 Units With Breakfast, and 25 With Evening Meal). 12)  Vitamin C 500 Mg Chew (Ascorbic Acid) .Marland Kitchen.. 1 By Mouth Dialy 13)  Metrogel-Vaginal 0.75 % Gel (Metronidazole) .... As Directed 14)  Cerave  Crea (Emollient)  .... As Directed 15)  Screening Mammography 16)  Ampicillin 500 Mg Caps (Ampicillin) .Marland Kitchen.. 1 By Mouth Once Daily  Allergies (verified): No Known Drug Allergies  Past History:  Past Medical History: Last updated: 12/03/2008 CHF (ICD-428.0) (EF 25%) NEED PROPHYLACTIC VACCINATION&INOCULATION FLU (ICD-V04.81) CHOLELITHIASIS (ICD-574.20) MORBID OBESITY (ICD-278.01) DIABETIC  RETINOPATHY (ICD-250.50) NEPHROPATHY, DIABETIC (ICD-250.40) HYPERLIPIDEMIA (ICD-272.4) DIABETES MELLITUS, TYPE II (ICD-250.00) DEPRESSION (ICD-311) CAD CVA  Review of Systems  The patient denies hypoglycemia.    Physical Exam  General:  morbidly obese.  no distress  Neck:  no masses, thyromegaly, or abnormal cervical nodes.  tracheostomy scar is present Additional Exam:   Hemoglobin A1C       [H]  7.6 %     Impression & Recommendations:  Problem # 1:  DIABETES MELLITUS, TYPE II (ICD-250.00) this is the best control this pt should aim for, given this regimen, which does match insulin to her changing needs throughout the day  Medications Added to Medication List This Visit: 1)  Ampicillin 500 Mg Caps (Ampicillin) .Marland Kitchen.. 1 by mouth once daily 2)  Onetouch Ultra Blue Strp (Glucose blood) .... Once daily, and lancets 250.01  Other Orders: TLB-A1C / Hgb  A1C (Glycohemoglobin) (83036-A1C) Est. Patient Level III ([address])  Patient Instructions: 1)  blood tests are being ordered for you today.  please call [phone] to hear your test results. 2)  pending the test results, please continue insulin to 30 units with the first meal of the day, and 25 with the last meal). 3)  Please schedule a follow-up appointment in 3 months. 4)  here is a new meter.  i have sent a prescription for strips to your pharmacy. Prescriptions: ONETOUCH ULTRA BLUE  STRP (GLUCOSE BLOOD) once daily, and lancets 250.01  #90 x 3   Entered and Authorized by:   Minus Breeding MD   Signed by:   Minus Breeding MD on 06/30/2010   Method used:    Electronically to        [facility]  [phone]* (retail)       [address]       [place], [place]  [address]       Ph: [phone] or [phone]       Fax: [phone]   RxID:   [id number]    Orders Added: 1)  TLB-A1C / Hgb A1C (Glycohemoglobin) [83036-A1C] 2)  Est. Patient Level III [95638]   Immunization History:  Influenza Immunization History:    Influenza:  historical (12/16/2009)   Immunization History:  Influenza Immunization History:    Influenza:  Historical (12/16/2009)      Appended Document: 3 MTH FU--NWS (update: i left message on phone-tree:  rx as we discussed)

## 2010-07-12 ENCOUNTER — Other Ambulatory Visit: Payer: Self-pay | Admitting: Cardiology

## 2010-07-13 NOTE — Telephone Encounter (Signed)
[address] °

## 2010-07-14 ENCOUNTER — Other Ambulatory Visit: Payer: Self-pay | Admitting: *Deleted

## 2010-07-14 MED ORDER — FUROSEMIDE 20 MG PO TABS: 20.0000 mg | ORAL_TABLET | ORAL | Status: DC

## 2010-07-25 ENCOUNTER — Other Ambulatory Visit: Payer: Self-pay | Admitting: Cardiology

## 2010-08-04 ENCOUNTER — Ambulatory Visit (INDEPENDENT_AMBULATORY_CARE_PROVIDER_SITE_OTHER): Payer: Medicare Other | Admitting: *Deleted

## 2010-08-04 DIAGNOSIS — I6350 Cerebral infarction due to unspecified occlusion or stenosis of unspecified cerebral artery: Secondary | ICD-10-CM

## 2010-08-04 LAB — POCT INR: INR: 2.9

## 2010-08-10 ENCOUNTER — Other Ambulatory Visit: Payer: Self-pay | Admitting: Endocrinology

## 2010-08-29 NOTE — Assessment & Plan Note (Signed)
[facility]                          CHRONIC HEART FAILURE NOTE   NAME:MORRIS-CAMPBELLShenaya, Lebo              MRN:          [id number]  DATE:12/09/2007                            DOB:          [birth year]    PRIMARY CARDIOLOGIST:  Rollene Rotunda, MD, [facility]   PRIMARY CARE:  Cleophas Dunker. Everardo All, MD   Ebonique returns today for further followup of her congestive heart failure  which is secondary to ischemic cardiomyopathy with EF 20%-25%.  Dewey  has been doing well since I last saw her 3 months ago.  She has multiple  health problems and has refused more aggressive treatment in the past.  She continues to enjoy quilting, has a rather sedentary lifestyle.  In  talking with Sophiya today and her daughter [name], Ms. Lyndi has not  been sleeping very well, in fact, she states there are mornings when she  wakes up and she feels like she never slept at all.  In talking with her  daughter [name], she states that she has noticed her mother seems to  be snoring more pronounced and that she hears her snorting at night  sometimes.  Other than the poor sleep, Clarine denies any symptoms  suggestive of volume overload.  She denies any orthopnea or PND.  She  states she saw Dr. Everardo All.  Since I saw her back in May, hemoglobin A1c  was 6.3 and that was in June 2009.   PAST MEDICAL HISTORY:  1. Congestive heart failure secondary to ischemic cardiomyopathy with      EF 20%-25%.  2. Coronary artery disease status post CABG.  3. Depression with the patient refusing medical therapy at this time.  4. Chronic anticoagulation therapy.  5. 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  6. Dyslipidemia.  7. Obesity.  8. Diabetes.  9. Diabetic retinopathy.  10.Diabetic nephropathy.  11.History of medical noncompliance secondary to financial issues.      The patient is currently applying for Medicaid assistance.   REVIEW OF SYSTEMS:  As stated above.   CURRENT MEDICATIONS:  1. Furosemide 20 mg daily.  2. Pravastatin 40 mg 2 tablets nightly.  3. Enalapril 20 mg 2 tablets daily.  4. Multivitamin daily.  5. Insulin 70/30, the patient takes 45 units b.i.d.  6. Klor-Con 20 mEq daily.  7. Hydralazine 12.5 mg t.i.d.  8. Bisoprolol/hydrochlorothiazide 10/6.25 mg daily.  9. Aspirin 81 daily.  10.Warfarin per Coumadin Clinic.   PHYSICAL EXAMINATION:  VITAL SIGNS:  Weight today 264 pounds, down 5  pounds from May.  Blood pressure 131/64 with a heart rate of 60.  GENERAL:  Maryalyce is in no acute distress.  NECK:  She has no signs of jugular vein distention.  LUNGS:  Clear to auscultation bilaterally.  CARDIOVASCULAR:  S1 and S2.  Regular rate and rhythm.  ABDOMEN:  Obese, soft, nontender, positive bowel sounds.  LOWER EXTREMITIES:  Without clubbing, cyanosis, or edema.  NEUROLOGICAL:  Alert and oriented x3.  Flat affect.   IMPRESSION:  Congestive heart failure secondary to ischemic  cardiomyopathy without signs of volume overload at this time.  We will  continue current medications.  I am concerned that Deyjah might have  obstructive sleep apnea.  She is agreeable to proceed with a sleep  study.  She will decide later whether or not she would attempt a CPAP if  indeed needed.  In the past, she has been somewhat reluctant to any  further treatment of her illnesses other than medications.      [name], ACNP  Electronically Signed      [name]. [name], MD  Electronically Signed   MB/MedQ  DD: 12/09/2007  DT: 12/10/2007  Job #: [id number]

## 2010-08-29 NOTE — Assessment & Plan Note (Signed)
[facility]                          CHRONIC HEART FAILURE NOTE   NAME:[name]-CAMPBELLChanelle, Hodsdon              MRN:          [id number]  DATE:07/09/2007                            DOB:          1951-01-04    PRIMARY CARE PHYSICIAN:  [name].   PRIMARY CARDIOLOGIST:  Dr. Rollene [name].   [name] returns today for follow-up of her congestive heart  failure, which is secondary to ischemic cardiomyopathy with EF of 20% to  25%, status post recent echocardiogram with no improvement from previous  EF in 2006.  [name] is accompanied by her daughter,  [name], again today.  [name] has been relatively stable.  She complains of arthritic changes in her hands.  This is rather  depressing for her, because she spends the majority of her time  quilting.  This is what brings her joy in life.  She states she has had  to take one extra dose of Lasix, since I last saw her for some mild  weight gain.  This resolved a problem, and she has had no further issues  with volume overload since then.  She states she has seen Dr. Everardo [name],  and adjustments were made in her insulin; otherwise, denies any  orthopnea or PND.  No pre-syncope or syncopal episodes.  She continues  to remain dyspneic with minimal exertion and continues to adamantly  refuse any further consideration of interventions that might improve her  quality of life or halt sudden cardiac death.  You can refer to my  previous notes, for further discussion regarding this with Ms. MorrisOrvan [name] and her daughter, [name].   PAST MEDICAL HISTORY:  1. Congestive heart failure secondary to ischemic cardiomyopathy with      EF 20-25.  2. Coronary artery disease status post CABG.  3. 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  4. Hypertension.  5. Diabetes.  6. 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  7. Stress Myoview 2005 showed EF 38% with mild ischemia in the      inferior basal wall with global hypokinesis.  8. Depression with the patient refusing medical therapy.  9. Dyslipidemia.  10.Obesity.  11.Chronic anticoagulation therapy, followed here in the Coumadin      Clinic.  12.Diabetic retinopathy.  13.Remote history of tobacco use.  14.History of medical noncompliance, secondary to financial issues.   REVIEW OF SYSTEMS:  As stated above, otherwise negative.   CURRENT MEDICATIONS:  Include:  1. Furosemide 20 mg daily.  2. Pravastatin 80 mg q.h.s.  3. Enalapril 40 mg daily.  4. Multivitamin daily.  5. Insulin per Dr. Everardo [name].  6. Warfarin per Coumadin Clinic.  7. Aspirin 81 mg daily.  8. Metoprolol HCTZ 10/6.25 mg daily.   PHYSICAL EXAM:  VITAL SIGNS:  Weight 268.  Weight is up 2 pounds from  February, blood pressure 136/68 with heart rate of 57.  GENERAL:  [name] is in no acute distress.  NECK:  Mild JVD, 78 cm at a 45-degree angle.  LUNGS:  Clear to auscultation with distant breath sounds  throughout.  CARDIOVASCULAR:  Exam reveals S1 and S2, regular rate and rhythm.  I do  not hear any murmur, rubs or gallops.  ABDOMEN:  Obese, soft, nontender, positive bowel sounds.  LOWER EXTREMITIES:  Without clubbing, cyanosis or edema, maybe just a  trace of edema noted, outer ankle area.  NEUROLOGICAL:  Alert and oriented x3, ambulating in clinic without  assistance, somewhat flat affect.   IMPRESSION:  Congestive heart failure secondary to ischemic  cardiomyopathy, without signs of volume overload at this time.  Will  continue current medications.  The patient has no further interest in  ICD therapy.  Lab work checked in February showed a potassium of 4.4,  BUN and creatinine 29 and 1.6.  BNP of 192.  I will see [name] back in 2  months, sooner if she has any problems.      [name], ACNP  Electronically Signed      [name] Rotunda, MD, [facility]   Electronically Signed   MB/MedQ  DD: 07/09/2007  DT: 07/09/2007  Job #: [id number]

## 2010-08-29 NOTE — Assessment & Plan Note (Signed)
[facility]                          CHRONIC HEART FAILURE NOTE   NAME:MORRIS-CAMPBELLJolyssa, [name]              MRN:          [id number]  DATE:08/15/2007                            DOB:          12/26/1950    PRIMARY CARDIOLOGIST:  Dr. Rollene Rotunda.   PRIMARY CARE PHYSICIAN:  Dr. Romero Belling.   [name] returns today for follow-up of her congestive heart failure which  is secondary to ischemic cardiomyopathy with EF 20-25% status post  recent echocardiogram with no improvement in ejection fraction.  When I  last saw [name] I talked with her at length and also her daughter  [name] concerning her cardiomyopathy and risk for sudden cardiac  death.  [name] adamantly refuses any further consideration of ICD, and  only wants to maintain on medications.  We had a very extensive  conversation on her feelings about her health in general.  See my note  dated May 28, 2007 for further communication.  [name] states she has  been doing well since I last saw her.  She complains of some numbness in  her hands.  She does a lot of quilting.  I wonder whether or not she may  have some carpal tunnel syndrome or it could be some neuropathy from her  diabetes.  She states her sugars have been running around 100 fasting in  the mornings.  She is complaining of a scratch on her right heel that is  tender, but does not have any erythema or drainage.  Her weight today is  up 1 pound, but she denies any shortness of breath or orthopnea.  She  complains of being restless at night and sleeping poorly, but this is  not new for her.  She is dyspneic on exertion without change in previous  status.   PAST MEDICAL HISTORY:  1. Congestive heart failure secondary to ischemic cardiomyopathy with      EF 20-25%.  2. Coronary artery disease status CABG.  3. 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  4. Hypertension.  5. Diabetes.  6. Status post tracheostomy  following her CVA in 2001 also with      temporary PEG tube placement at that time for nutritional      supplement.  7. Stress Myoview 2005 showing EF 38% with mild ischemia in inferior      basal wall with global hypokinesis.  8. Depression with the patient refusing medical therapy at this time.  9. Dyslipidemia.  10.Obesity.  11.Chronic anticoagulation therapy followed here in the [facility].  12.Diabetic nephropathy.  13.Remote history of tobacco use.  14.History of medical noncompliance secondary to financial issues.   CURRENT MEDICATIONS:  1. Furosemide 20 mg daily.  2. Pravastatin 80 mg q.h.s.  3. Enalapril 40 mg daily.  4. Multivitamin daily.  5. Insulin per Dr. Everardo All.  6. Warfarin per [facility].  7. Aspirin 81 mg daily.  8. Bisoprolol/HCTZ 10/6.25 mg daily.   REVIEW OF SYSTEMS:  As stated above; otherwise, negative.   PHYSICAL EXAMINATION:  VITAL SIGNS:  Weight 269 pounds, blood pressure  168/86 with a heart rate of 68.  GENERAL:  In no acute distress.  NECK:  No signs of jugular vein distention at 45 degree angle.  LUNGS:  Clear to auscultation bilaterally.  CARDIOVASCULAR:  Reveals S1, S2, regular rate and rhythm.  ABDOMEN:  Obese, soft, nontender, positive bowel sounds.  EXTREMITIES:  Lower extremities without clubbing, cyanosis.  She has  trace of edema.  NEUROLOGICAL:  Alert and oriented x3, flat affect.   IMPRESSION:  Congestive heart failure secondary to ischemic  cardiomyopathy with ejection fraction maintained at 20-25% with patient  declining any consideration of ICD or further discussion regarding this.  I am going to start her on hydralazine 12.5 mg t.i.d.  I initially  considered spironolactone.  However, [name] has some mild renal  insufficiency and creatinine 1.5 over the last few visits with potassium  running between 4.1 and 4.5.  Will add hydralazine and check lab work.  The patient is due to follow up with Dr. Antoine Poche for  routine cardiology  visit.  Will also arrange that.      [name], ACNP  Electronically Signed      [name]. [name], MD  Electronically Signed   MB/MedQ  DD: 08/15/2007  DT: 08/15/2007  Job #: [id number]   cc:   Gregary Signs A. Everardo All, MD

## 2010-08-29 NOTE — Assessment & Plan Note (Signed)
[facility]                          CHRONIC HEART FAILURE NOTE   NAME:[name], [name]              MRN:          [id number]  DATE:04/28/2007                            DOB:          12-27-1950    PRIMARY CARE PHYSICIAN:  Sean A. Everardo All, MD   PRIMARY CARDIOLOGIST:  Rollene Rotunda, MD, [facility]   NEURO:  Deanna Artis. Sharene Skeans, M.D.   CVTS:  Salvatore Decent. Dorris Fetch, M.D.   [name] is new to my heart failure clinic.  She was  previously followed here back in 2006. Has really had a lapse in her  medical care since that time. She did see Dr. Antoine Poche back in mid 2008,  but has not followed up with cardiology since then. She returns today at  the request of Dr. Everardo All, who is her primary care physician.  [name] states she has had socioeconomic/financial problems that  have limited her ability to return to the doctor's office for medical  care.  She does state compliance with having her PT/INR check here in  the [facility]. She actually stated that if she did not have a  medical problem going on, she did not see the need to keep coming to the  doctor. This is the same attitude she had prior to her CABG back in  2002.  She states that she was in good health until she started having  some swelling in her legs and then everything happened at one time.  Previously she was a third grade teacher, very independent and active.  She now lives with her daughter and has had multiple medical problems  since 2002 at the time of bypass. Last evaluation of her ejection  fraction appears to be back in 2006, at which time her EF was reported  at less than 25%. Congestive heart failure secondary to ischemic  cardiomyopathy status post CABG 2002.  [name] states that  she has been doing okay since she last saw Dr. Antoine Poche 2007. When she  saw Dr. Everardo All recently she states she had a chest x-ray done that  showed fluid around  her heart per the patient's report.  She was started  on furosemide 20 mg daily and instructed to return here for follow-up of  her heart failure.  She denies any orthopnea or PND, although she states  she becomes extremely dyspneic with exertion as stated above.  She lives  here in [place] with her daughter who works full-time. Just recently  was approved for disability.  She states she has been fighting the  system for several years trying to get disability as she has no medical  insurance. Finally resorted to assistance with probably an associate law  firm. She states she does take all of her medications as prescribed.  Previously was on medications that required a p.m. dosing and often  forgot to take those just secondary to forgetfulness.  She does very  limited housework and has a very sedentary lifestyle.  She enjoys  quilting and takes a quilting class two nights a week. Other than that  minimal activity and has seen an increase in  her weight over the last  year or so. She does not feel it is fluid.  She just states that she  does not do any activity.  She goes to the store a few times a week with  her daughter, but states she is very uncomfortable for her because she  has to stop at least three times from the parking lot to the store  secondary to increased shortness of breath. She denies any orthopnea or  PND. States her CBG usually run 130s in the morning and Dr. Everardo All  manages her diabetes. She does very light housework, minimal cooking.  Her daughter generally does the cooking and strenuous housework.  She  states she does care for her cat and she enjoys quilting. These are the  things that give her pleasure in life.  She denies being depressed but  has a history of depression and is currently not on any medications.   PAST MEDICAL HISTORY:  1. Congestive heart failure secondary to ischemic cardiomyopathy with      an ejection fraction of less than 25%. By ultrasound in  2006  2. Coronary artery disease status post coronary artery bypass graft      with left internal mammary artery  to the left anterior descending      artery, saphenous vein graft to obtuse marginal.  Saphenous vein      graft  to the posterior descending artery.  Saphenous vein graft      to the first diagonal and saphenous vein graft to the obtuse      marginal.  3. Status post right-sided cerebrovascular accident postoperatively.  4. Hypertension.  5. Diabetes, currently insulin dependent.  6. Status post tracheostomy following a cerebral vascular accident in      2001 with a temporary PEG tube placement.  7. Most recent stress Myoview in 2005 showing ejection fraction 38%      with mild ischemia in the inferior basal wall with global      hypokinesis.  8. Depression.  9. Dyslipidemia.  10.Obesity.  11.Remote history of tobacco use.  12.History of medical noncompliance.  13.Diabetic retinopathy.  14.Chronic anticoagulation therapy.   REVIEW OF SYSTEMS:  As stated above.   CURRENT MEDICATIONS:  1. Include furosemide 20 mg daily.  2. Pravastatin 80 mg daily.  3. Enalapril 40 mg daily.  4. Multivitamin daily.  5. Insulin per Dr. Everardo All.  6. Warfarin per the [facility].  7. Aspirin 81 mg daily.  8. Bisoprolol/hydrochlorothiazide 10/6.25 mg one p.o. daily.   ALLERGIES:  No known drug allergies at this time.   PHYSICAL EXAM:  Weight 265 pounds, last documented weight was 243 pounds  and that was in May 2008.  Blood pressure 148/90 with a heart rate of  66. [name] was in no acute distress.  She is alert and  oriented with a very flat affect.  No signs of jugular vein distention of 45 degrees angle.  LUNGS:  Clear to auscultation bilaterally today.  CARDIOVASCULAR: Reveals S1-S2, regular rate and rhythm.  ABDOMEN:  Obese, soft, nontender, positive bowel sounds.  Lower extremities without clubbing, cyanosis or edema.   IMPRESSION:  Congestive heart failure  secondary to ischemic  cardiomyopathy without signs of volume overload at this time with class  II/early class III symptoms. [name] needs to have an  echocardiogram repeated to reevaluate ejection fraction.  She is on  appropriate medications at this time including an ACE inhibitor, full-  dose bisoprolol, diuretic and  blood pressure is not optimally  controlled.  I have asked her to check her blood pressure when she out  and about at the stores and [name] what number she is getting. She  states that she will, but she does not like to have a blood pressure  taken because it is extremely painful to her arm.  She seems to be  rather focused on this so I did not push the issue at this time. Also  she states she does not do well taking medications that required more  than once a day dosing and she often forgets to take them. Also note the  last lab work in [name]'s chart is from May 2008. Potassium  was 4.5, BUN and creatinine 29 and 1.2. I have informed her that I am  going to need to check blood work today.  She states she really does not  want to have any done as Dr. Everardo All usually checks it.  I explained to  her the importance of routine labs work concerning her kidney function  and a baseline BNP for further management of her heart failure. I am not  sure that I convinced her of the importance or significance of this, but  she has agreed to have blood work drawn today.  I would like to see her  back in 1 months.  I have initiated heart failure education with her and  her daughter, [name] who accompanies her today.  Will see if the  patient returns in 1 month. Will go ahead and schedule blood work and  echocardiogram today.      [name], ACNP  Electronically Signed      [name]. [name], MD  Electronically Signed   MB/MedQ  DD: 04/28/2007  DT: 04/28/2007  Job #: [id number]

## 2010-08-29 NOTE — Assessment & Plan Note (Signed)
[facility]                          CHRONIC HEART FAILURE NOTE   NAME:[name], [name]              MRN:          [id number]  DATE:04/23/2008                            DOB:          Feb 19, 1951    PRIMARY CARDIOLOGIST:  Rollene Rotunda, MD, [facility]   PRIMARY CARE:  Cleophas Dunker. Everardo All, MD   [name] returns today for further followup of her congestive heart  failure, which is secondary to ischemic cardiomyopathy with EF 20-25%.  [name] states she has been doing well since I last saw her in October.  She has multiple medical problems and has refused more aggressive  therapy in the past including consideration of ICD therapy, CPAP for her  sleep apnea or medical treatment of her depression.  [name] has had no  symptoms suggestive of volume overload through the holidays, very  diligent and following a 2-g sodium diet.  Her daughter, [name], does  all the cooking and chopping, has had some episodes of chest discomfort  described as a sharp tightness that takes her breath away and lasts 5-10  minutes and resolves spontaneously.  This does not occur with any  exertion or activities.  Has no associated symptoms.  Has now become  more frequent.  She still enjoys quilting and sewing.  Has an  appointment with Dr. Everardo All next week.  INR checked this morning 2.7.  blood pressure is significantly lower today than it normally is.  However, [name] does not usually take her medicines prior to her  appointment with me.  This morning, she states she woke up early and  went ahead and took her medicines before.  Her daughter [name] picked  her up.  She denies any lightheadedness or dizziness, palpitations,  presyncope, or syncopal episodes.   PAST MEDICAL HISTORY:  1. Congestive heart failure secondary to ischemic cardiomyopathy with      EF 20-25%.  The patient refusing further preventative treatments      such as ICD.  2. Coronary artery disease status post  CABG.  3. Untreated depression.  4. Chronic anticoagulation therapy.  5. 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  6. Diabetes with retinopathy and nephropathy with a history of medical      noncompliance secondary to financial issues with the patient      cutting her insulin doses in half to make it lasts longer.  7. Obesity.  8. Dyslipidemia.   REVIEW OF SYSTEMS:  As stated above.   CURRENT MEDICATIONS:  1. Fish oil.  2. Vitamin C.  3. Vitamin D.  4. Furosemide 20 mg daily.  5. Pravastatin 80 mg at bedtime.  6. Enalapril 40 mg daily.  7. Multivitamin daily.  8. Warfarin per [facility].  9. Aspirin 81.  10.Bisoprolol/hydrochlorothiazide 10/625 mg daily.  11.Hydralazine 25 mg half a tablet t.i.d.  12.Insulin 70/30, 25 units b.i.d.  13.Klor-Con 10 mEq daily.   PHYSICAL EXAMINATION:  VITAL SIGNS:  Weight 271 pounds.  Weight is up 8  pounds from October.  Blood pressure is 76/54 in the left arm with a  heart rate of 79.  GENERAL:  [name] is in no acute distress.  NECK:  No signs of jugular vein distention at a 90-degree angle (the  patient unable to sit comfortably on table prefers to sit in chair for  assessment).  LUNGS:  Clear to auscultation bilaterally.  CARDIOVASCULAR:  An S1 and an S2.  Regular rate and rhythm.  ABDOMEN:  Soft, nontender, positive bowel sounds.  LOWER EXTREMITIES:  Without clubbing, cyanosis, or edema.  NEUROLOGIC:  Alert and oriented x3 with a flat effect which is  consistent.   IMPRESSION:  Congestive heart failure secondary to ischemic  cardiomyopathy without signs of volume overload.  [name] has an  appointment with Dr. Everardo All next week because she is so sensitive to  having blood drawn and prefers to wait.  We will ask Dr. Everardo All draw a  BMP and BMET on the patient when she has her labs drawn next week.      [name], ACNP  Electronically Signed      Rollene Rotunda, MD, [facility]  Electronically Signed    MB/MedQ  DD: 04/23/2008  DT: 04/23/2008  Job #: [id number]

## 2010-08-29 NOTE — Assessment & Plan Note (Signed)
[facility]                          CHRONIC HEART FAILURE NOTE   NAME:[name], [name]              MRN:          [id number]  DATE:05/28/2007                            DOB:          [birth year]    PRIMARY CARE PHYSICIAN:  Sean A. Everardo All, MD.   PRIMARY CARDIOLOGIST:  Rollene Rotunda, MD.   Deatra JamesDeanna Artis. Hickling, M.D.   [name] returns today for follow-up of her congestive heart  failure. I just saw her as a new patient back in 05-22-2022 of this year.  She returns today accompanied by her daughter.  I had a repeat  echocardiogram done since I last saw her. The results today which showed  an EF of 20-25% with no improvement from previous echocardiogram done in  2006. I had touched on the topic of sudden cardiac death an ICD  implantation in 22-May-2022 when I met with the [name] and her  daughter, [name] with plans for further discussion today [name]-  Orvan [name] states she has been doing stable since I last saw her. She  denies any shortness of breath, states she sleeps without any problems  of orthopnea or PND. PT/INR has been therapeutic. Blood pressure at home  she has purchased a monitor, she states it was 120/74 last night around  9:00 p.m. Weight at home has been stable between 255 and 258.  She  continues to live with her daughter. [name] is a very  depressed young lady. She states that she has very few things in life  that give her pleasure. She states that she should not be alive that she  should have died 5 years ago when she had her stroke after her heart  surgery and she feels that is taking up resources that could be  available to other people at this time.  She denies any suicidal  ideation or tendency. She states she will continue with her lifestyle at  it is and has no plans of doing anything to prolong her life at this  time including pacemaker, ICD or a biventricular device.  She is very  matter-of-fact about her health and quality of life and states that she  does not want to discuss this any further.   PAST MEDICAL HISTORY:  1. Congestive heart failure secondary to ischemic cardiomyopathy with      EF 20-25%.  2. Coronary disease status post CABG.  3. Status post right-sided cerebrovascular accident postoperatively      after bypass.  4. Hypertension.  5. Diabetes.  6. Status post tracheostomy following a CVA in 2001 with a temporary      PEG tube placement.  7. Recent stress Myoview in 2005 showed EF of 38% with mild ischemia      in the inferior basal wall with global hypokinesis.  8. Depression with the patient refusing medical therapy.  9. Dyslipidemia.  10.Obesity.  11.History of medical noncompliance secondary to financial issues.  12.Chronic anticoagulation therapy.  13.Diabetic retinopathy.  14.Remote history of tobacco use.   REVIEW OF SYSTEMS:  As stated above.   CURRENT MEDICATIONS:  1. Furosemide 20 mg daily.  2. Pravastatin 40 mg 2 at bedtime.  3. Enalapril 20 mg b.i.d.  4. Multivitamin daily.  5. Insulin per Dr. Everardo All.  6. Aspirin 81 mg.  7. Coumadin per [facility].  8. Bisoprolol HCTZ 10/6.25 mg daily.   PHYSICAL EXAM:  Weight 266 pounds, blood pressure 163/83 per the  patient's blood pressure monitor she brought from home.  The patient  refuses to have blood pressure checked here as she states is extremely  painful for her with a heart rate of 59. [name] is in no  acute distress.  She has a very flat a fact. No signs of jugular vein  distention at 45 degree angle.  LUNGS:  Clear to auscultation with poor inspiratory effort.  CARDIOVASCULAR:  Exam reveals an S1 and S2,  regular rate and rhythm.  ABDOMEN:  Obese, soft, nontender, positive bowel sounds.  LOWER EXTREMITIES:  Without clubbing, cyanosis or edema.   IMPRESSION:  Congestive heart failure secondary to ischemic  cardiomyopathy without signs of  volume overload at this time.  Status  post recent echocardiogram showing no improvement in ejection fraction  from 2006. Will continue current medications.  I have had a long  discussion with [name] and her daughter, [name]. No  further discussion regarding ICD will be implemented at this point  unless the patient inquires. I have instructed [name] that  I will be glad to continue following her heart failure and will do my  best to keep her stable and out of the hospital.  She understands that  she is at increased risk for sudden cardiac death, but she refuses any  further interventions at this time.      [name], ACNP  Electronically Signed      Rollene Rotunda, MD, [facility]  Electronically Signed   MB/MedQ  DD: 05/28/2007  DT: 05/29/2007  Job #: [id number]   cc:   Gregary Signs A. Everardo All, MD  Deanna Artis. Sharene Skeans, M.D.

## 2010-08-29 NOTE — Assessment & Plan Note (Signed)
[name]                            CARDIOLOGY OFFICE NOTE   NAME:MORRIS-CAMPBELLHonesty, [name]              MRN:          [id number]  DATE:08/27/2006                            DOB:          1950/06/10    PRIMARY CARE PHYSICIAN:  Sean A. Everardo All, MD   REASON FOR PRESENTATION:  Evaluate patient with ischemic cardiomyopathy.   HISTORY OF PRESENT ILLNESS:  The patient returns for followup. She is  currently taking the medications I had previously prescribed. She is  able to afford these now. She has not had any new symptoms. She actually  feels a little bit better. She will get shortness of breath when she is  doing something such as walking up stairs. However, she is not having  any resting shortness of breath. Denies any PND or orthopnea. She had  one episode of chest tightness since I last saw her, but has not had  this in quite a while. This was brief and not particularly symptomatic.  She will occasionally have her heart racing when she wakes up in the  morning, but this goes away quickly. She has otherwise had no  palpitations, pre-syncope, syncope.   PAST MEDICAL HISTORY:  1. Ischemic cardiomyopathy (EF of approximately 25%).  2. Coronary artery disease, status post coronary artery bypass graft      (Left internal mammary artery (LIMA) to the left anterior      descending artery (LAD); saphenous vein graft (SVG) to obtuse      marginal and posterior descending artery (PDA); saphenous vein      graft (SVG) to first diagonal; saphenous vein graft (SVG) to obtuse      marginal 2).  3. Right-sided cerebral vascular accident, on chronic Coumadin      therapy.  4. Infection of right leg incision.  5. Tracheostomy.  6. Temporary gastrostomy tube.  7. Hypertension.  8. Anemic.  9. Depression.   ALLERGIES:  None.   MEDICATIONS:  1. Coumadin.  2. Aspirin 81 mg daily.  3. Enalapril 10 mg b.i.d.  4. Ziac 5/6.25 daily.  5. Simvastatin 20 mg  daily.  6. Glyburide 4 mg b.i.d.   REVIEW OF SYSTEMS:  As stated in the HPI and otherwise negative for  other systems.   PHYSICAL EXAMINATION:  The patient is in no distress. Blood pressure is  142/88, heart rate 60 and regular, weight 243 pounds, body mass index of  44.  HEENT: Eyelids unremarkable. Pupils equal, round, and reactive to light.  Fundi not visualized. Oral mucosa is unremarkable.  NECK: No jugular venous distention at 45 degrees. Carotid upstroke brisk  and symmetrical. No bruits, no thyromegaly.  LYMPHATICS: No adenopathy.  LUNGS: Clear to auscultation bilaterally.  BACK: No costovertebral angle tenderness.  CHEST: Well-healed sternotomy scar.  HEART: PMI not displaced or sustained. S1, S2 within normal limits. No  S3. No S4. No clicks, rubs, or murmurs.  ABDOMEN: Obese, positive bowel sounds, normal in frequency and pitch. No  bruits. No rebounds. No guarding. No midline pulsatile mass. No  hepatomegaly, splenomegaly.  SKIN: No rashes, no nodules.  EXTREMITIES: 2+ pulses throughout. No edema.  NEURO:  Grossly intact.   EKG: Sinus rhythm, left bundle branch block. Left axis deviation. No  change from previous.   ASSESSMENT/PLAN:  1. Cardiomyopathy. The patient is actually having Class II symptoms      now. I am going to increase her bisoprolol/hydrochlorothiazide to      10/6.25. She will continue on the other medications as listed. Will      get a BMET.  2. Obesity. We discussed the need to lose weight with diet and      exercise and I prescribed the [facility] Diet.  3. Risk reduction. She is tolerating the statin. She has had a      reasonable lipid profile in December. We do not need to recheck      this.  4. Followup. I will see her back in about six months or sooner if      needed.     Rollene Rotunda, MD, [facility]     JH/MedQ  DD: 08/27/2006  DT: 08/27/2006  Job #: [id number]

## 2010-08-29 NOTE — Assessment & Plan Note (Signed)
[facility]                          CHRONIC HEART FAILURE NOTE   NAME:[name]              MRN:          [id number]  DATE:09/10/2007                            DOB:          07-Nov-1950    PRIMARY CARDIOLOGIST:  Dr. Rollene [name].   PRIMARY CARE PHYSICIAN:  Dr. Romero [name].   [name] returns today for follow-up of her congestive heart failure which  is secondary to ischemic cardiomyopathy with an EF of 20-25%.   SUMMARY:  I have spoken with [name] at length about further cardiac  workup, including ICD therapy.  She has declined any further  consideration of workup.  These conversations have been held with her  daughter, [name] present.  [name] only wants to be maintained on  medications.  She states she has been doing quite well since I last saw  her in early May.  I added hydralazine to her medications.  She states  she has had some light diarrhea since starting hydralazine, but this  seems to have settled down.  Otherwise, she continues to maintain her  current status.  She does a lot of quilting, sewing, a very sedentary  lifestyle.  Denies any episodes of chest discomfort, palpitations,  lightheadedness, presyncope or syncopal episodes.  States her weight has  been stable at home.  She consistently checks her blood pressure and has  been getting acceptable readings at 110s/60s.   PAST MEDICAL HISTORY:  1. Congestive heart failure secondary to ischemic cardiomyopathy with      EF 20-25%.  2. Coronary artery disease, status post CABG.  3. Depression with patient refusing medical therapy at this time.  4. Chronic anticoagulation therapy, followed here in the [facility]      [facility].  5. 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   REVIEW OF SYSTEMS:  As stated above, otherwise negative.   CURRENT MEDICATIONS:  1. Furosemide 20 mg daily.  2. Pravastatin 80 mg daily.  3. Enalapril 40 daily.  4. Multivitamin  daily.  5. Insulin per Dr. Everardo [name].  6. Warfarin per [facility].  7. Aspirin 81 daily.  8. Bisoprolol/HCTZ 10/6.25 mg daily.  9. Hydralazine 12.5 mg t.i.d.  10.Klor-Con 20 mEq daily.   P.R.N. MEDICATIONS:  Furosemide 20 mg pain.   PHYSICAL EXAMINATION:  VITAL SIGNS:  Weight 269.  Blood pressure 119/59  with a heart rate of 62.  GENERAL:  [name] is in no acute distress.  NECK:  No signs of jugular vein distention at 45 degrees angle.  LUNGS:  Poor expiratory effort, otherwise clear to auscultation.  CARDIOVASCULAR:  Reveals an S1-S2.  Regular rate and rhythm.  ABDOMEN:  Obese, soft, nontender.  Positive bowel sounds.  LOWER EXTREMITIES:  Without clubbing or cyanosis.  She has trace edema.  NEUROLOGICAL:  Alert and oriented x3 with a flat affect.   IMPRESSION:  1. Congestive heart failure secondary to ischemic cardiomyopathy with      ejection fraction 20-25% with patient refusing any further      consideration of implantable cardioverter-defibrillator therapy.      Will continue current medications.  [name]'s fluid volume status has  been very stable.  Will continue the diuretic at current dose.  She      is to use 20 mg furosemide p.r.n.  She knows to call me if she has      been more than 2-3 doses in 1 week as further adjustment will need      to be made.  I am going to see [name] back in 3 months, sooner if      she has any problems.      [name], ACNP  Electronically Signed      [name] Buckles. [name], MD  Electronically Signed   MB/MedQ  DD: 09/10/2007  DT: 09/10/2007  Job #: [id number]   cc:   [name] Signs A. [name] All, MD

## 2010-08-29 NOTE — Assessment & Plan Note (Signed)
[facility]                          CHRONIC HEART FAILURE NOTE   NAME:MORRIS-CAMPBELLMariaceleste, [name]              MRN:          [id number]  DATE:02/11/2008                            DOB:          Dec 23, 1950    PRIMARY CARDIOLOGIST:  Rollene Rotunda, MD, [facility]   PRIMARY CARE:  Cleophas Dunker. Everardo All, MD   [name] returns today for further followup of her congestive heart failure  which is secondary to ischemic cardiomyopathy with EF of 20-25%.  [name]  states she has been doing okay since I last saw her in August.  If you  will recall she has multiple health problems and has refused more  aggressive therapy in the past, this includes consideration of ICD  therapy, CPAP for her sleep apnea, medicinal treatment of her  depression.  [name] is accompanied by [name], her daughter today who  states her mother has been doing about the same.  She is concerned she  cannot afford her insulin and [name] has cut her dose of insulin to half  daily to make it lasts longer until she hears something from company  that makes the insulin as to whether or not she will be a candidate for  some assistance.  Other than that she denies any chest discomfort,  denies any symptoms suggestive of volume overload, still sleeps poorly,  recently had blood work done at Dr. [name] office.  Total cholesterol  was 191, triglycerides 238, HDL 29, LDL not calculated, AST 24, ALT 22,  hemoglobin A1c of 6.5   PAST MEDICAL HISTORY:  1. Congestive heart failure secondary to ischemic cardiomyopathy with      EF 20-25% with the patient refusing further treatment.  2. Coronary artery disease status post CABG.  3. Depression.  4. Chronic anticoagulation therapy.  5. 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  6. Diabetes with retinopathy and nephropathy.  7. Obesity.  8. Dyslipidemia.  9. History of medical noncompliance secondary to financial issues.   REVIEW OF SYSTEMS:  As stated  above.   CURRENT MEDICATIONS:  1. Insulin 70/30, the patient states she has only taken 25 units twice      a day.  2. Klor-Con 20 a day.  3. Hydralazine 12.5 t.i.d.  4. Bisoprolol/HCTZ 10/6.25 daily.  5. Aspirin 81.  6. Warfarin per [facility].  7. Multivitamin daily.  8. Enalapril 40 daily.  9. Pravastatin 80 daily.  10.Furosemide 20 daily.   REVIEW OF SYSTEMS:  As stated above, otherwise negative.   PHYSICAL EXAMINATION:  VITAL SIGNS:  Weight 263 pounds, blood pressure  133/67 with the heart rate of 65.  [name] is in no acute distress.  HEENT:  No signs of jugular vein distention at 90 degree angle.  LUNGS:  Clear to auscultation bilaterally.  CARDIOVASCULAR:  S1 and S2 regular rate and rhythm.  ABDOMEN:  Obese, soft, nontender, positive bowel sounds.  LOWER EXTREMITIES:  Without clubbing, cyanosis or edema.  NEUROLOGICAL:  Alert and oriented x3 with flat effect.   IMPRESSION:  Congestive heart failure secondary to ischemic  cardiomyopathy without signs of volume overload.  Most recent potassium  noted to be 4.9  on December 09, 2007.  I am going to ask [name] to  discontinue her KCl to 10 mEq daily and we will plan on repeating her  blood work next month, will not do it today as she is very sensitive to  being having blood drawn and prefers to wait in regards to her other  multiple medical problems.  The patient declines any further treatment.  She is aware of the risk involved in not treating her LV dysfunction and  sleep apnea.      [name], ACNP  Electronically Signed      [name]. [name], MD  Electronically Signed   MB/MedQ  DD: 02/11/2008  DT: 02/12/2008  Job #: [id number]

## 2010-09-01 ENCOUNTER — Ambulatory Visit (INDEPENDENT_AMBULATORY_CARE_PROVIDER_SITE_OTHER): Payer: Medicare Other | Admitting: *Deleted

## 2010-09-01 DIAGNOSIS — I6350 Cerebral infarction due to unspecified occlusion or stenosis of unspecified cerebral artery: Secondary | ICD-10-CM

## 2010-09-01 NOTE — Consult Note (Signed)
[facility]  Patient:    [name], [name]                MRN: [id number] Proc. Date: 03/02/00 Adm. Date:  [id number] Attending:  Charlett Lango CC:         Salvatore Decent. Dorris Fetch, M.D.   Consultation Report  DATE OF BIRTH:  07-30-1950  CHIEF COMPLAINT:  Unresponsive.  HISTORY OF PRESENT CONDITION:  The patient was admitted to [facility] on February 22, 2000, with history of shortness of breath and tightness in her chest.  She had evidence of edema in her feet.  The patient was evaluated and found to have very significant coronary artery disease.  She also had evidence of diabetes with glucoses of over 400 and an elevated glycosylated hemoglobin.  Other medical problems included uncontrolled hypertension, obesity. Evaluation by cardiology showed normal carotid Dopplers without significant plaque, no evidence of intracranial stenosis, and antegrade vertebral flow.  A 2-D echocardiogram showed markedly decreased left ventricular systolic function at 20 to 30% with diffuse left ventricular hypokinesis.  No ventricular thromboses noted.  There was moderate to severe tricuspid valvular regurgitation.  Evaluation also found normal TSH.  After evaluation by arteriogram, the patient was noted to have diffuse severe coronary artery disease and underwent a five-vessel coronary artery bypass graft on February 29, 2000.  The patient did well during surgery.  There was no sign of complication.  She had blood pressures in the mid 80s to 90s and did not show evidence of hypoxic ischemic insult, although she has had slowly rising creatinines.  The patient failed to awaken and is now two days postoperative.  She is also noted to have some posturing of her extremities.  On the basis of this, I was asked to see the patient to determine the etiology of her dysfunction and to make recommendations for further workup and treatment.  PAST MEDICAL  HISTORY:  The patient had no prior past medical history that would suggest risk factors; however, quite clearly, they were present (see above).  PAST SURGICAL HISTORY:  Cholecystectomy and two cesarean sections.  The patient had not been to see a doctor in 20 years.  SOCIAL HISTORY:  The patient has been under stress.  Her mother died in 2022/09/17, father died in 11/17/22.  The patient moved to [facility] to live with her daughter who is her only daughter.  The patient has been teaching third grade. She has moved into an apartment on the second floor and had to use stairs frequently.  The patient does not use tobacco or alcohol nor does she exercise.  FAMILY HISTORY:  Mother died at age 25 with hypertension, diabetes, heart disease.  Father died at age 63 with hypertension, cerebrovascular accident. The patient has two brothers who are alive and well, two sisters who are alive and well.  Her daughter is age 24 and is well.  REVIEW OF SYSTEMS:  See above.  The patient has had increasing shortness of breath.  She has a history of asthma with occasional cough and has had chest pain.  She had symptoms of vomiting and diarrhea on admission.  She had not complained of polyuria or polydipsia, hematuria, dysuria, or anemia.  She has not had problems with diabetes.  She is in the midst of menopause with last menstrual period four months prior to that.  Review of Systems otherwise negative except as noted above.  CURRENT MEDICATIONS: 1. Amaryl 4 mg q.d. 2. Digoxin 0.25  mg q.d. 3. Aspirin 325 mg q.d. 4. Dulcolax 10 mg q.d. 5. Metoprolol 12.5 mg b.i.d. 6. Pepcid 20 mg q.12h. 7. Lasix 40 mg b.i.d. 8. Potassium chloride 20 mEq q.d. 9. Altace 10 mg q.d.  The latter three medications have not been ordered postoperatively.  P.R.N. medicines include morphine, potassium chloride.  ALLERGIES:  None known.  PHYSICAL EXAMINATION:  GENERAL:  This is a comatose woman lying in bed and unresponsive to  all stimuli.  VITAL SIGNS:  Blood pressure 144/62, resting pulse 104, respirations 12, temperature 100.  HEENT:  No signs of infection.  NECK:  Supple, no bruits.  LUNGS:  Clear.  HEART:  No murmurs, pulses normal.  CHEST: The patient has a healing sternal incision.  ABDOMEN:  Soft, nontender.  No hepatosplenomegaly.  Diminished to absent bowel sounds.  EXTREMITIES:  Right saphenous vein graft site is fresh.  NEUROLOGIC:  Comatose, unresponsive.  Cranial nerves: Pupils reactive, 4 mm to 3 mm.  Fundi: Disks are pale but sharp.  Symmetric movements of her eyes both spontaneous and to dolls. Little gag, no corneals.  Motor examination: The patient shows basically decerebrate posturing to noxious stimuli.  There are some semi-purposeful movements seen.  Sensory: No volitional movement to withdraw from pain.  Deep tendon reflexes diminished. Toes bilaterally extension.  IMPRESSION:  I suspect the patient has had bicerebral strokes above the level of the mid brain.  This could have occurred intraoperatively.  She has numerous risk factors.  Among them, severe atherosclerotic cardiovascular disease with cardiomyopathy, uncontrolled hypertension, and diabetes.  I doubt but cannot rule out possible hypoxic ischemic encephalopathy, but there is no evidence for this.  PLAN: 1. Cranial CT scan now. 2. We will need a workup including 2-D echocardiogram and Doppler.  Prognosis for this woman is guarded.  I will be happy to speak with her family once workup is completed.  CT scan will be reviewed when it is completed. DD:  03/02/00 TD:  03/03/00 Job: [id number]

## 2010-09-01 NOTE — Op Note (Signed)
[place]  Patient:    [name], [name]                MRN: [id number] Proc. Date: 03/15/00 Adm. Date:  [id number] Attending:  Charlett Lango                           Operative Report  PREOPERATIVE DIAGNOSIS:  Respiratory failure requiring long-term mechanical ventilation and the need for intravenous access.  POSTOPERATIVE DIAGNOSIS:  Respiratory failure requiring long-term mechanical ventilation and the need for intravenous access.  PROCEDURE:  Tracheostomy, placement of left subclavian triple lumen catheter.  SURGEON:  Salvatore Decent. Dorris Fetch, M.D.  ASSISTANT:  None.  ANESTHESIA:  General.  FINDINGS:  Normal trachea.  CLINICAL NOTE:  Mrs. Rorke is a 60 year old female, status post coronary artery bypass grafting for 3-vessel disease with severe ischemic cardiomyopathy.   The patient suffered a postoperative or perioperative cerebrovascular accident and also developed pneumonia and had respiratory failure requiring long-term mechanical ventilation.  Tracheostomy is indicated for pulmonary toilet, patient comfort and ventilatory weaning.  When the patient arrived in the operating room and the dressing was taken off the right subclavian central line, there was noted to be drainage at the insertion site. Therefore, a new central line was indicated so that the preexisting one could be removed.  DESCRIPTION OF PROCEDURE:  The left chest was prepped and draped in usual fashion.  The patient was placed in steep Trendelenburg position.  A left subclavian triple lumen catheter was placed using the Seldinger technique. The vein was accessed on the second pass.  The wire passed easily, PVCs were noted as the wire passed into the ventricle.  The wire was withdrawn and the PVCs stopped.  The tract was dilated and the catheter was advanced into position and secured with 2-0 silk sutures.  There was good flow from all ports.  All  ports were flushed with heparinized saline.  The neck then was prepped and draped in the usual fashion.  A transverse incision was made one fingerbreadth  above the sternal notch.  The incision was carried through the skin and subcutaneous tissue. The  strap muscles were identified and were split in the midline.  The pretracheal fascia was identified and incised.  The trachea itself was identified.  The second tracheal ring was identified, 2-0 silk sutures were placed through the tracheal ring on the anterolateral aspect of either side.  This was on the second tracheal cartilage.  The balloon on the endotracheal tube was deflated. A transverse incision was made between the second and third tracheal cartilage.  Initially, a size 8 tracheostomy tube was attempted, but this was too large for the diameter of the trachea and a #6 Shiley trach was placed into the trachea without difficulty.   The inner cannula was placed, the tracheostomy was hooked to the ventilatory apparatus.  There was good end-tidal CO2 tracing.  Patient remained well-saturated throughout the placement of the tracheostomy.   The endotracheal tube was withdrawn the remainder of the way through the cords and from the mouth. There was good hemostasis at the tracheostomy site.  The wound was packed with half inch Nu Gauze.  The skin on the other side was reapproximated with 3-0 Prolene suture.  The flange of the tracheostomy tube was secured to the skin of the neck with 2-0 Prolene sutures and the velcro tracheostomy tie was placed to further secure the tracheostomy.  The patient  tolerated the entire procedure well.  There were no intraoperative complications and the patient was taken from the operating room back to the surgical intensive care unit in fair condition. DD:  03/15/00 TD:  03/15/00 Job: [id number]

## 2010-09-01 NOTE — Discharge Summary (Signed)
[place]. [facility]  Patient:    [name], [name]                MRN: [id number] Adm. Date:  [id number] Disc. Date: [id number] Attending:  Evern Core Dictator:   Cruz Condon, P.A. CC:         Dr. Jeanie Sewer, M.D.  Salvatore Decent Dorris Fetch, M.D.  Deanna Artis. Sharene Skeans, M.D.  Tera Mater. Evlyn Kanner, M.D.   Discharge Summary  DISCHARGE DIAGNOSES: 1. Status post embolic cerebrovascular accident. 2. Status post coronary artery bypass grafting. 3. Right wound dehiscence almost healed. 4. Hypoglycemia. 5. Hypertension. 6. Post hemorrhagic anemia, resolved. 7. Depression.  HISTORY OF PRESENT ILLNESS:  [name] is a 60 year old female admitted November 08 with chest pain, uncontrolled hypertension and hypoglycemia.  She was started on oral hypoglycemics and cardiac catheterization on November 12 showed severe 3-vessel disease.  She underwent CABG November 15 by Dr. Dorris Fetch and postoperatively, was difficult to arouse and some posturing noted.  Dr. Sharene Skeans was consulted and a CT showed possible embolic right brain infarct.  The patient was started on heparin and transitioned to Coumadin.  Dr. Evlyn Kanner has been following along for medical issues.  As patients mentation improved, patients diet was advanced to tube feeds and not to take liquids secondary to flash penetration.  She has been on tube feeds secondary to poor p.o. intake.  She did have respiratory failure requiring a trach and was decannulated 24-hours early.  Her right thigh wound was noted to dehisce and currently the patient undergoing pulse lavage with V.A.C. treatment.  The patient is noted to have depressed mood and was started on Remeron with Celexa added recently.  Dr. Jeanie Sewer was consulted for follow-up and diagnosed the patient as having major depression. He recommends increasing Celexa if Remeron does not help patients p.o. intake.  Currently,  the patient is participating with therapies and is minimal assist for bed mobility, needs cues for truncal instability, ambulating 5 feet to stand on a walker with min-to-moderate assist.  Her left hemiparesis is improving.  PAST MEDICAL HISTORY:  Significant for hypertension, peripheral edema and cholecystectomy.  ALLERGIES:  Patient has itching with CATAPRES patch.  SOCIAL HISTORY:  Patient was a third grade teacher, independent and active prior to admission.  She lives with daughter in an apartment.  Does not smoke and uses alcohol rarely.  HOSPITAL COURSE:  [name] was admitted to rehabilitation on April 12, 2000 for inpatient therapy to consist of PT and OT daily.  On past admission the patient was maintained on Coumadin for CVA prophylaxis. The patients protime has been reasonably stable on 5 mg per day of Coumadin. At time of admission, the patients voice was noted to be of soft volume and she needed encouragement to verbalize.  Her left upper extremity was noted to be 2/5 with left lower at 4/5 proximally.  The patient was taken off Remeron and Celexa was increased with some minimal improvement in her affect.  Dr. Gladstone Pih of neuropsychiatry had been following the patient along for support.  Labs were checked at admission and last recheck of January 18 shows hemoglobin 12.3, hematocrit 37.2, white count 7.2, platelets 316,000. Sodium 137, potassium 3.9, chloride 105, CO2 27, BUN 23, creatinine 1.2, glucose 139, AST 27, ALT 25, ALP 61, total bilirubin 0.4.  Stool guaiacs x 4 were done and have been negative.  The patients right thigh incision has healed extremely well and currently the patient has been transitioned to  dry dressings to help debride some minimal yellow eschar on certain spots.  A home health R.N. has been arranged to follow up on wound care as needed.  The patient has done well in her therapy, however, she is currently is able to  exhibit intellectual awareness of her medical diagnoses and thus has, however, she has decreased insight into her cognitive deficits and continues to deny her cognitive deficits.  Currently, patient is noted to have decrease in complex problem solving and her basic comprehension, however, comprehension basic expression is intact and speech is intelligible. The patients affect and selective attentions are grossly within normal limits, alternating in divided attention noticed to be decreased.  Her left upper extremity is currently at [facility] stage III with stage IV with hand.  She demonstrates decreased safety awareness at times and with walker use for self-care needs.  She is modified independent for upper body care and requires some supervision for lower body dressing.  In terms of mobility, she is independent for bed mobility, independent to sit to stand transfers, modified independent for ambulating 200 feet, modified independent in hospital room using a rolling walker and requires some close supervision to steady herself, ambulating on uneven surfaces.  She is able to ambulate with steady assist without use of an assistive device.  She is noted to have increase in lateral sway with occasional stumbles with loss with some decrease in balance.  On further follow-up, home health PT, OT, and speech therapy to continue past discharge.  Dr. Leonides Cave to follow up with patient in three weeks for evaluation of cognitive deficits.  He felt that patient had mild decrease memory function especially memory retrieval.  She did show about average capacity for abstract reasoning, but problem solving is diminished. She will follow up with Dr. Johna Roles in one month for recheck.  Dr. Johna Roles to initially follow patients Coumadin.  The duration of Coumadin therapy to  be decided by Dr. Dorris Fetch and Dr. Sharene Skeans.  On May 12, 1999 the patient is discharged to home.  DISCHARGE MEDICATIONS: 1.  Coumadin 5 mg p.o. per day. 2. Catapres 0.1 mg p.o. q.8h. 3. Celexa 20 mg per day. 4. Toprol 50 mg b.i.d. 5. Multivitamin one per day.  ACTIVITY:  Twenty-four hour supervision, ambulate only with assistance.  DIET:  No concentrated sweets.  SPECIAL INSTRUCTIONS: 1. No alcohol, no smoking, no driving. 2. Advance home health care with PT, OT, and speech therapy. 3. Wound care:  Apply bacitracin ointment to wound and cover with gauze and    tape.  Change daily.  FOLLOW-UP:  The patient is to follow up with Dr. Johna Roles in one month, follow up with Dr. Dorris Fetch in 2-3 weeks for postoperative check and follow up with Dr. Sharene Skeans for appointment in one month.  Follow up with Dr. Leonides Cave for cognitive evaluation in three months. DD:  05/10/00 TD:  05/10/00 Job: [id number]

## 2010-09-01 NOTE — Cardiovascular Report (Signed)
[place]. [facility]  Patient:    [name], [name]                MRN: [id number] Proc. Date: 02/26/00 Adm. Date:  [id number] Attending:  Ophelia Shoulder CC:         Tera Mater. Evlyn Kanner, M.D.  Cardiovascular [facility]  Cath Lab   Cardiac Catheterization  PROCEDURE: 1. Selective coronary angiography by Judkins technique. 2. Retrograde left heart catheterization. 3. Left ventricular angiography. 4. Abdominal aortography. 5. Right heart catheterization. 6. Thermodilution cardiac output determinations.  RESULTS:  The atrial mean pressure was 18, the right ventricular pressure was 47/20, central aortic pressure 47/25.  Pulmonary capillary wedge mean pressure was 21, the A wave was 22, V wave 23.  Aorta 155/80.  Left ventricle 155/26. Cardiac output determinations thermal; 4.9 cardiac output, 2.46 cardiac index. Fick determinations; 5.5 cardiac output, 2.8 cardiac index.  Arteriovenous oxygen difference 3.53, normal.  ANGIOGRAPHIC RESULTS:  No pericardial, coronary, or valvular calcifications were seen.  The left main coronary artery was normal.  The left anterior descending coronary artery crosses the cardiac apex.  It is a diffusely diseased vessel.  Just beyond the septal perforator branch, a diagonal branch arises.  A discrete lesion of approximately 90% is seen in the mid to distal portion of the diagonal.  A mid-LAD stenosis of 90% which is fairly discrete is noted.  The distal runoff is rather poor.  The left circumflex coronary artery is a dominant vessel giving rise to a posterior descending branch.  The first obtuse marginal and second obtuse marginal branches of the circumflex arise very close together.  The first OM is diffusely diseased.  There is a discrete lesion in the proximal portion. The second obtuse marginal branch is larger.  There is a lesion of 90% in the midportion.  The posterior descending  branch that arises from the distal circumflex contains a 75% stenosis proximally.  A second branch of this vessel which I assume is a posterolateral branch is diffusely diseased.  The right coronary artery is nondominant.  There is a 75% stenosis in what appears to be an acute marginal branch.  The left ventricle shows global hypokinesis, ejection fraction 20 to 25% with no LV thrombus and no mitral regurgitation.  FINAL DIAGNOSES: 1. Three vessel coronary artery disease.    a. 90% mid left anterior descending.    b. 90% mid diagonal.    c. 90% proximal first obtuse marginal branch stenosis of circumflex.    d. 90% mid obtuse marginal branch 2 stenosis of left circumflex.    e. 75% stenosis of proximal posterior descending arising from circumflex.    f. 75% acute marginal branch stenosis proximally. 2. Left ventricular dysfunction severe, ejection fraction 20 to 25%. 3. Moderately elevated pulmonary artery pressures. 4. Normal cardiac output and index on dobutamine infusion. 5. Diabetes mellitus. 6. Exertional chest pain prior to admission.  PLAN:  Surgical consultation regarding coronary artery bypass grafting.  If the patient is not a good candidate in terms of poor distal target runoff, medical therapy would then be recommended. DD:  02/26/00 TD:  02/26/00 Job: [id number]

## 2010-09-01 NOTE — Assessment & Plan Note (Signed)
[facility]                              CARDIOLOGY OFFICE NOTE   NAME:[name], [name]              MRN:          [id number]  DATE:11/12/2005                            DOB:          10-Oct-1950    PRIMARY CARE PHYSICIAN:  Sean A. Everardo All, MD   REASON FOR VISIT:  Patient with ischemic cardiomyopathy.   HISTORY OF PRESENT ILLNESS:  The patient returns for follow up.  Unfortunately, since I last saw her, she ran out of all of her medications  and has not been able to afford to get them refilled.  She is taking the  list described below.  She is getting a little more short of breath than she  was previously.  She has good days and bad days.  She is not describing any  new PND or orthopnea.  She has actually had a 10 pound weight loss.  She has  had no new chest pain.  She is not describing palpation and presyncope.   PAST MEDICAL HISTORY:  1.  Ischemic cardiomyopathy (EF approximately 25%).  2.  CABG (LIMA to the LAD, SVG to obtuse marginal and PDA, SVG to first      diagonal, SVG to obtuse marginal II).  3.  Right sided cerebrovascular accident on chronic Coumadin therapy.  4.  Infection of a right leg incision.  5.  Tracheostomy.  6.  Temporary gastrostomy tube.  7.  Hypertension.  8.  Anemia.  9.  Depression.   ALLERGIES:  None.   MEDICATIONS:  1.  Coumadin.  2.  Aspirin 81 mg daily.  3.  Glyburide 5 mg daily.  4.  Multivitamin.   REVIEW OF SYSTEMS:  As stated in the HPI and otherwise negative for other  systems.   PHYSICAL EXAMINATION:  GENERAL:  The patient is in no distress.  VITAL SIGNS:  Blood pressure 172/96, heart rate 80 and regular, weight 235  pounds.  Body mass index 42.  HEENT:  Eyes:  Unremarkable.  Pupils equal, round and reactive to light.  Fundi not visualized.  Warm mucosa, unremarkable.  NECK:  No JVD distention for 45 degrees.  Carotid upstroke brisk and  symmetric.  No bruits or thyromegaly.  LUNGS:   Clear to auscultation bilaterally.  BACK:  No costovertebral angle tenderness.  CHEST:  Well-healed sternotomy scar.  HEART:  PMI not displaced or sustained.  S1, S2 within normal limits.  No  S3, S4, murmurs.  ABDOMEN:  Obese, positive bowel sounds.  Normal in frequency and pitch.  No  bruits, rebound, guarding __________.  No organomegaly.  SKIN:  No rashes or nodules.  EXTREMITIES:  2+ pulses __________ edema.  No cyanosis or clubbing.  NEUROLOGICAL:  Grossly intact.   STUDIES:  EKG:  Sinus rhythm, left bundle branch block, right superior axis  changed from previous.   ASSESSMENT/PLAN:  1.  Coronary artery disease.  The patient is off her medications.  I am      going to prescribe for her $4 drugs that she should be able to afford.      She is going to be put on Zebeta  5/12.5.  Will titrate this upward.      Other medications as below.  2.  Ischemic cardiomyopathy as above.  I will titrate her medications back      on affordable drugs.  I will start with the Zebeta as above.  She will      start Enalapril 10 mg b.i.d.  In two weeks, she needs a B-met.  3.  Risk reduction.  I will start her on Simvastatin 20 mg daily.  She will      need a liver and lipid in six weeks.  4.  Diabetes.  I have taken the liberty of renewing her prescription for      glyburide 5 mg daily.  I believe this to be the current dose, and this      is what she has been taking.  I will give her a limited supply of this      and ask her to get follow up with Dr. Everardo All.  5.  Obesity.  She has been counseled on the need to loose weight with diet      and exercise.  6.  Will see her in about six weeks for the next medication titration.  She      will come back in two weeks for blood work.                               Rollene Rotunda, MD, [facility]    JH/MedQ  DD:  11/12/2005  DT:  11/12/2005  Job #:  [id number]   cc:   Gregary Signs A. Everardo All, MD

## 2010-09-01 NOTE — Discharge Summary (Signed)
[name]. [facility]  Patient:    [name]                MRN: [id number] Adm. Date:  [id number] Disc. Date: 04/12/00 Attending:  Evern Core Dictator:   Adair Patter, P.A. CC:         Salvatore Decent. Dorris Fetch, M.D.  Madaline Savage, M.D.  Deanna Artis. Sharene Skeans, M.D.  Tera Mater. Evlyn Kanner, M.D.  Paulino Rily, M.D.   Discharge Summary  DATE OF BIRTH:  27-Nov-1950  ADMISSION DIAGNOSES:  1. Uncontrolled hypertension.  2. Chest pain.  SECONDARY DIAGNOSIS:  Type 2 diabetes mellitus.  DISCHARGE DIAGNOSES:  1. Status post coronary artery bypass grafting x 5.  2. Right-sided cerebrovascular accident.  3. 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  HOSPITAL PROCEDURES:  1. Cardiac catheterization.  2. Bilateral carotid duplex.  3. Bilateral upper extremity Doppler exam.  4. Bilateral lower extremity Doppler exam.  5. Coronary artery bypass grafting x 5.  6. Right leg incision debridement.  7. Tracheostomy.  8. Percutaneous endoscopic gastrostomy tube placement.  9. VAC treatment for right leg incision. 10. Bedside swallowing evaluation.  HOSPITAL COURSE:  [name]. [name] was admitted to [facility] on February 22, 2000, for workup of chest pain. Part of this workup included cardiac catheterization which showed diffuse three-vessel coronary artery disease. Because of this, Salvatore Decent. Dorris Fetch, M.D. was consulted. February 29, 2000, she underwent a coronary artery bypass grafting x 5 with the left internal mammary artery anastomosed to the left anterior descending artery, a sequential saphenous vein graft to the acute marginal and the posterior descending, a saphenous vein graft to the first diagonal, and saphenous vein graft to the OM2 artery. This procedure was performed by Viviann Spare C. Dorris Fetch, M.D. under general endotracheal anesthesia. No complications were noted immediately postoperatively. Her  postoperative course was complicated by right-sided cerebrovascular accident. This was treated with aggressive physical therapy and occupational therapy. She made slow but very progressive improvement. She was also treated with anticoagulation, initially with heparin and then with Coumadin. Because of the cerebrovascular accident, she required prolonged ventilatory support, enteral nutrition, and tracheostomy. She also experienced some postoperative anemia which was treated with blood transfusion. In addition to those complications, she also developed postoperative pneumonia and right leg wound infection which were treated with antibiotics and incision and debridement of the leg wound. As stated earlier, the patient made progressive but very slow improvement. However, she was eventually discharged to the rehab unit in stable, satisfactory condition on April 12, 2000.  MEDICATIONS AT TIME OF TRANSFER TO REHABILITATION:  1. Clonidine 0.2 mg patch, apply one patch daily.  2. Amaryl 4 mg one tablet daily.  3. Coumadin, dose to be determined by INR.  4. Prevacid 30 mg twice daily via PEG tube.  5. Remeron 30 mg daily via PEG tube.  6. Metoprolol 50 mg using a 12.5 mg/5 ml solution twice daily via PEG tube.  7. Lasix 40 mg daily via PEG tube.  8. Reglan 10 mg p.o. q.d.  9. Lantus insulin 20 units subcutaneous in the evening. 10. Benadryl cream apply to affected area twice daily. 11. Celexa 10 mg p.o. q.d. 12. Tranxene 7.5 mg p.o. q.d. 13. Dulcolax 10 mg per rectum p.r.n. constipation. 14. Tylenol 650 mg q.4. p.r.n. via PEG tube. 15. Tylox one to two tablets p.o. q.6h. p.r.n. pain. 16. Fleet enema per rectum p.r.n. constipation. 17. Sliding scale insulin. 18. Benadryl 12.5 mg q.6h. p.r.n.  via PEG tube. 19. Restoril 15 mg p.o. q.d. p.r.n. 20. Ambien 5 mg p.o. at bedtime as needed for insomnia. 21. Hydrocortisone 1% cream applied to affected area once daily as needed. 22. Artificial  Tears one drop to affected eye as needed.  DISCHARGE ACTIVITY:  The patient will undergo activity as directed by staff on the rehab floor.  DISCHARGE DIET:  The patient will be placed on D3 nectar-thick liquid diet. She will also receive 960 ml of Jevity nutritional supplement through her PEG tube every 12 hours. She will also be encouraged to drink 150 ml Ensure nutritional supplements three times daily.  DISCHARGE WOUND CARE:  The patient will continue to receive the VAC treatments to her right thigh. Nursing staff will also be instructed to cover her trach site with a sterile gauze daily.  DISPOSITION:  To rehab floor.  FOLLOW-UP:  Salvatore Decent. Dorris Fetch, M.D. and his staff will continue to follow patient while she is on the rehab floor and will arrange for outpatient follow-up at which time she is discharged from rehab floor. DD:  04/12/00 TD:  04/12/00 Job: [id number]

## 2010-09-01 NOTE — Op Note (Signed)
[place]. [facility]  Patient:    [name], [name]                MRN: [id number] Proc. Date: 02/29/00 Adm. Date:  [id number] Attending:  Charlett Lango CC:         Madaline Savage, M.D.  Tera Mater. Evlyn Kanner, M.D.   Operative Report  PREOPERATIVE DIAGNOSIS:  Severe 3-vessel coronary disease with ischemic cardiomyopathy and class 4 congestive failure.  POSTOPERATIVE DIAGNOSIS: Severe 3-vessel coronary disease with ischemic cardiomyopathy and class 4 congestive failure.  PROCEDURE:  Median sternotomy, extracorporeal circulation, coronary artery bypass grafting x 5 (left internal mammary artery to left anterior descending, saphenous vein graft to first diagonal, saphenous vein graft to second obtuse marginal, sequential saphenous vein graft to acute marginal and left posterior descending).  SURGEON:  Salvatore Decent. Dorris Fetch, M.D.  ASSISTANT:  Loura Pardon, P.A.  ANESTHESIA:  General.  FINDINGS:  Poor quality, small diffusely diseased target vessels.  LIMA and saphenous vein good quality.  Left ventricular hypertrophy and dilatation. Posterolateral scar.  Transesophageal echocardiogram: severe global left ventricular dysfunction.  No significant mitral regurgitation.  CLINICAL NOTE:  [name] is a 60 year old patient, who presented with class 4 congestive heart failure.  She is newly diagnosed hypertensive and diabetic.  Echocardiogram was performed, which revealed global left ventricular dysfunction with an ejection fraction of 20%.  The patient also has morbid obesity.  Cardiac catheterization was performed, which revealed severe 3-vessel coronary disease with distal disease and poor quality targets and an ejection fraction of 20%.  The patient was referred for consideration of coronary artery bypass grafting.  The indications, risks, benefits and alternatives of the procedure were discussed in detail with the patient and her  daughter.  She understood that this was a high-risk procedure due to her severe left ventricular dysfunction, morbid obesity, diabetes and poor targets.  She accepted the risks and agreed to proceed.  DESCRIPTION OF PROCEDURE:  [name] was brought to the preoperative holding area on February 29, 2000.  Lines were placed to monitor arterial, central venous and pulmonary arterial pressure.  EKG leads were placed for continuous telemetry.  The patient was taken to the operating room, anesthetized and intubated.  A Foley catheter was placed, intravenous antibiotics were administered.  The chest, abdomen and legs were prepped and draped in the usual fashion.  Transesophageal echocardiography was performed. It revealed global left ventricular dysfunction with an ejection fraction of roughly 20%.  There was no significant valvular pathology.  A femoral A-line was placed in the left femoral artery using the Seldinger technique.  A median sternotomy was performed.  Simultaneously, an incision was made in the medial aspect of the right leg and the greater saphenous vein was harvested from the ankle to the thigh.  The left internal mammary artery was harvested in the standard fashion.  Both the mammary and saphenous vein were of good quality. The patient was fully heparinized prior dividing the distal end of the mammary artery.  There was good flow through the cut end of the vessel.  The mammary artery was placed in a papaverine soaked sponge and placed into the left pleural space.  The pericardium was opened.  Of note, there was moderate pericardial effusion, which was serous in nature.  The ascending aorta was palpated.  There was no palpable atherosclerotic disease.  The aorta was cannulated via concentric 2-0 Ethibond purse-string sutures.  A dual stage venous cannula was placed via purse-string suture in the  right atrial appendage.  Cardiopulmonary bypass was instituted and the  patient was cooled to 32 degrees Celcius.  The coronary arteries were inspected and anastomotic sites chosen.  Of note, the coronaries were all small and diffusely diseased. The LAD and OM-2 were intramyocardial.  However, both were relatively superficially intramyocardial.  The conduits were cut to length.  A left ventricular vent was placed via a purse-string suture in the right superior pulmonary vein and directed into the left ventricle.  A retrograde cardioplegia cannula was placed via purse-string suture in the right atrium and directed into the coronary sinus.  Positioning of the cannula was confirmed with palpation of the tip of the catheter as well as, a coronary sinus wedge pressure tracing with balloon inflation.  An antegrade cardioplegia cannula was placed in the ascending aorta.  A foam pad was placed in the pericardium to protect the phrenic nerve and a temperature probe was placed in the myocardial septum.  The aorta was cross-clamped.  The left ventricle was emptied via the aortic root and the left ventricular vents.  Cardiac arrest then was achieved with a combination of antegrade and retrograde blood cardioplegia and topical ice saline.  One liter of cardioplegia was administered.  The myocardial septal temperature was 14 degrees Celcius.  The following distal anastomoses were performed.  First, a reverse saphenous vein graft was placed sequentially to the acute marginal branch of the nondominant right as well as, the posterior descending, which arose from the left circumflex coronary artery.  The acute marginal was a 1.2 mm poor quality target.  It was diffusely diseased, as was the posterior descending, which was a 1 mm poor quality target.  The saphenous vein was of good quality.  Both anastomoses were performed with running 7-0 Prolene sutures.  Cardioplegia was administered down the graft and there was good hemostasis.  Next, a reverse saphenous vein graft was  placed end-to-side to the second  obtuse marginal branch of the left circumflex coronary artery.  This was the largest of her coronaries.  It was 1.6 mm in diameter.  It also was diffusely diseased.  It was intramyocardial at the site of the anastomosis.  The vein graft was of good quality.  The anastomosis was performed end-to-side with a running 7-0 Prolene suture.  At the completion of the anastomosis, additional cardioplegia was administered.  There was a leak from the toe of the anastomosis, which was controlled with a single 7-0 Prolene suture.  Next, a reverse saphenous vein graft was placed end-to-side to the first diagonal branch of the LAD.  This graft was placed far out on the vessel because the blockage was in the mid portion of the diagonal.  This again, was a diffusely diseased vessel.  It was 1 mm in diameter.  The saphenous vein was relatively large.  The anastomosis was performed using a running 7-0 Prolene. At the completion of the anastomosis, it was probed proximally and distally to ensure patency.  Cardioplegia was administered.  There was a small leak from the heel, which was controlled with a single 7-0 Prolene suture.  The left internal mammary artery then was brought through a window in the left side of the pericardium.  The distal end was spatulated.  The distal LAD was identified intramyocardially.  This was a severely diseased vessel, but a 1 mm probe did pass both proximally and distally.  It was approximately 1.3 mm in diameter.  The left internal mammary artery was a good quality  conduit.  The anastomosis was performed end-to-side with a running 8-0 Prolene suture.  At completion of the anastomosis, the bulldog clamp was briefly removed to inspect for hemostasis.  The bulldog clamp then was replaced and additional cardioplegia was administered.  The cardioplegia cannula was removed from the ascending aorta.  The vein grafts were cut to length.  The  proximal vein graft anastomoses then were performed to 4.4 mm punch aortotomies with running 6-0 Prolene sutures.  At the completion of the final proximal anastomosis, the patient was placed in Trendelenburg position.  Air was allowed to vent as the cross-clamp was removed.  Prior to removing the cross-clamp, the deairing maneuvers had been performed on the heart with inflation of the lungs and filling of the heart to evacuate all air that may have been entrained into the left ventricle or aortic root.  As stated, the cross-clamp was removed.   The total cross-clamp time was 94 minutes.  Air was aspirated from each of the vein grafts, the bulldog clamps were removed and flow was restored.   All proximal and distal anastomoses were inspected for hemostasis.   Patient did require a single defibrillation with 20 joules and the patient was being rewarmed during the proximal anastomoses.  Epicardial pacing wires were placed on the right ventricle and right atrium.  The patient was given a loading dose and then started on a milrinone drip, a dopamine drip also was initiated.  As the patient reached a core temperature of 37 degrees Celcius, she was weaned from cardiopulmonary bypass.  The patient weaned from bypass without difficulty. Initial cardiac index was greater than 2 L/min/m.sq.  The patient remained hemodynamically stable throughout the postbypass period.  Total bypass time was 165 minutes.  A test dose of protamine was administered and was well-tolerated, the atrial and aortic cannulae were removed.  An additional dose of antibiotics was given.  The chest was irrigated with 1 L of warm normal saline containing 1 g of vancomycin.  Hemostasis was achieved.  It was only fair.  The left pleural and two mediastinal chest tubes were placed through separate subcostal incisions.  The pericardium was not reapproximated because of the enlarged size of the heart.  The sternum was closed with heavy  gauge, interrupted stainless steel wires.  The pectoralis fascia was closed a running #1 Vicryl suture.  Subcutaneous tissue was closed with a running 2-0 Vicryl suture and the skin was closed with a 3-0 Vicryl subcuticular suture.  In the leg, a 19 Blake drain was left in place.  The leg incision was closed in multiple layers and the skin was closed with a subcuticular suture.  Postbypass transesophageal echocardiography showed slight improvement in global cardiac function consistent with inotropic support.  All sponge, needle and instrument counts were correct at the end of the procedure.  There were no intraoperative complications.  The patient was taken from the operating room to the surgical intensive care unit intubated in stable condition.  The patient is not a candidate for redo bypass grafting. DD:  02/29/00 TD:  02/29/00 Job: [id number]

## 2010-09-03 ENCOUNTER — Other Ambulatory Visit: Payer: Self-pay | Admitting: Cardiology

## 2010-09-14 ENCOUNTER — Ambulatory Visit (INDEPENDENT_AMBULATORY_CARE_PROVIDER_SITE_OTHER): Payer: Medicare Other | Admitting: Cardiology

## 2010-09-14 ENCOUNTER — Encounter: Payer: Self-pay | Admitting: Cardiology

## 2010-09-14 DIAGNOSIS — E785 Hyperlipidemia, unspecified: Secondary | ICD-10-CM

## 2010-09-14 DIAGNOSIS — I509 Heart failure, unspecified: Secondary | ICD-10-CM

## 2010-09-14 DIAGNOSIS — I10 Essential (primary) hypertension: Secondary | ICD-10-CM

## 2010-09-14 DIAGNOSIS — I2510 Atherosclerotic heart disease of native coronary artery without angina pectoris: Secondary | ICD-10-CM

## 2010-09-14 LAB — HEPATIC FUNCTION PANEL
AST: 32 U/L (ref 0–37)
Albumin: 4 g/dL (ref 3.5–5.2)
Alkaline Phosphatase: 39 U/L (ref 39–117)
Total Bilirubin: 0.8 mg/dL (ref 0.3–1.2)

## 2010-09-14 LAB — LIPID PANEL
LDL Cholesterol: 79 mg/dL (ref 0–99)
Total CHOL/HDL Ratio: 3
Triglycerides: 150 mg/dL — ABNORMAL HIGH (ref 0.0–149.0)

## 2010-09-14 NOTE — Assessment & Plan Note (Signed)
I will order a lipid profile with a goal LDL less than 70 and HDL greater than 50.

## 2010-09-14 NOTE — Patient Instructions (Signed)
Please have blood work today Follow up with Dr Antoine Poche in 1 year  Continue current medications

## 2010-09-14 NOTE — Assessment & Plan Note (Signed)
We spent a great deal of time for this.  I helped her daughter look on line for a recumbent bike.  I suggested the [facility] Diet.

## 2010-09-14 NOTE — Progress Notes (Signed)
HPI The patient presents for followup of her new coronary disease and cardiomyopathy. Since I last saw her she has had no acute cardiovascular complaints. She is relatively inactive with back and joint pains and morbid obesity. She can do some minimal walking and then she goes up and down stairs. With this she is not describing any chest pressure, neck or arm discomfort. She is not having any new palpitations, presyncope or syncope. She has had no PND or orthopnea. She has had no weight gain or edema.   Allergies  Allergen Reactions  . Okra     Current Outpatient Prescriptions  Medication Sig Dispense Refill  . Ascorbic Acid (VITAMIN C) 500 MG tablet Take 500 mg by mouth daily.        Marland Kitchen aspirin 81 MG tablet Take 81 mg by mouth daily.        . bisoprolol-hydrochlorothiazide (ZIAC) 10-6.25 MG per tablet Take 1 tablet by mouth daily.        . cholecalciferol (VITAMIN D) 1000 UNIT tablet Take 1,000 Units by mouth daily.        . Emollient (CERAVE) CREA Apply topically as directed.        . enalapril (VASOTEC) 20 MG tablet TAKE ONE TABLET BY MOUTH TWICE DAILY  180 tablet  1  . fish oil-omega-3 fatty acids 1000 MG capsule Take 1 g by mouth daily.        . furosemide (LASIX) 20 MG tablet Take 1 tablet (20 mg total) by mouth every other day.  30 tablet  12  . hydrALAZINE (APRESOLINE) 25 MG tablet Take 25 mg by mouth 2 (two) times daily.        . insulin NPH-insulin regular (HUMULIN 70/30) (70-30) 100 UNIT/ML injection Inject into the skin. 30 units with breakfast and 25 units with evening meal       . metroNIDAZOLE (METROGEL) 0.75 % gel Apply topically as directed.        . Multiple Vitamin (MULTIVITAMIN) tablet Take 1 tablet by mouth daily.        . simvastatin (ZOCOR) 40 MG tablet Take 40 mg by mouth daily.        Marland Kitchen warfarin (COUMADIN) 5 MG tablet TAKE AS DIRECTED BY ANTICOAGULATION CLINIC  30 tablet  1    Past Medical History  Diagnosis Date  . CHF (congestive heart failure)     EF 25%  .  Cholelithiasis   . Diabetic retinopathy   . Diabetic nephropathy   . Hyperlipemia   . Diabetes mellitus   . Depression   . CAD (coronary artery disease)   . CVA (cerebral vascular accident)     Past Surgical History  Procedure Date  . Tonsillectomy   . Adenoidectomy   . Coronary artery bypass graft     X 5    ROS:  As stated in the HPI and negative for all other systems.  PHYSICAL EXAM BP 130/58  Pulse 61  Resp 19  Ht 5\' 2"  (1.575 m)  Wt 269 lb 6.4 oz (122.199 kg)  BMI 49.27 kg/m2 GENERAL:  Well appearing HEENT:  Pupils equal round and reactive, fundi not visualized, oral mucosa unremarkable NECK:  No jugular venous distention, waveform within normal limits, carotid upstroke brisk and symmetric, no bruits, no thyromegaly,  LYMPHATICS:  No cervical, inguinal adenopathy LUNGS:  Clear to auscultation bilaterally BACK:  No CVA tenderness CHEST:  Well healed sternotomy scar. HEART:  PMI not displaced or sustained,S1 and S2 within normal limits,  no S3, no S4, no clicks, no rubs, no murmurs ABD:  Flat, positive bowel sounds normal in frequency in pitch, no bruits, no rebound, no guarding, no midline pulsatile mass, no hepatomegaly, no splenomegaly, obese EXT:  2 plus pulses throughout, no edema, no cyanosis no clubbing SKIN:  No rashes no nodules NEURO:  Cranial nerves II through XII grossly intact, motor grossly intact throughout PSYCH:  Cognitively intact, oriented to person place and time   EKG:  ASSESSMENT AND PLAN

## 2010-09-14 NOTE — Assessment & Plan Note (Signed)
The blood pressure is at target. No change in medications is indicated. We will continue with therapeutic lifestyle changes (TLC).  

## 2010-09-14 NOTE — Assessment & Plan Note (Signed)
I reviewed her most recent echo from 2010.  Her EF was 35% which was improved from the EF at the time of her bypass. She is euvolemic.  No change in therapy is indicated.  She will continue the meds as listed.

## 2010-09-14 NOTE — Assessment & Plan Note (Signed)
Her last stress test was 2010.  Since that time she has had no new symptoms.  She will continue with risk reduction.

## 2010-09-18 ENCOUNTER — Other Ambulatory Visit: Payer: Self-pay | Admitting: Cardiology

## 2010-09-18 MED ORDER — WARFARIN SODIUM 5 MG PO TABS: ORAL_TABLET | ORAL | Status: DC

## 2010-09-25 ENCOUNTER — Encounter: Payer: Self-pay | Admitting: *Deleted

## 2010-09-28 ENCOUNTER — Other Ambulatory Visit: Payer: Self-pay | Admitting: Endocrinology

## 2010-09-29 ENCOUNTER — Encounter: Payer: Medicare Other | Admitting: *Deleted

## 2010-10-05 ENCOUNTER — Ambulatory Visit (INDEPENDENT_AMBULATORY_CARE_PROVIDER_SITE_OTHER): Payer: Medicare Other | Admitting: *Deleted

## 2010-10-05 DIAGNOSIS — I6350 Cerebral infarction due to unspecified occlusion or stenosis of unspecified cerebral artery: Secondary | ICD-10-CM

## 2010-10-06 ENCOUNTER — Ambulatory Visit: Payer: Medicare Other | Admitting: Endocrinology

## 2010-10-13 ENCOUNTER — Ambulatory Visit: Payer: Medicare Other | Admitting: Endocrinology

## 2010-10-26 ENCOUNTER — Other Ambulatory Visit: Payer: Self-pay | Admitting: Endocrinology

## 2010-11-03 ENCOUNTER — Ambulatory Visit (INDEPENDENT_AMBULATORY_CARE_PROVIDER_SITE_OTHER): Payer: Medicare Other | Admitting: *Deleted

## 2010-11-03 DIAGNOSIS — I6350 Cerebral infarction due to unspecified occlusion or stenosis of unspecified cerebral artery: Secondary | ICD-10-CM

## 2010-12-01 ENCOUNTER — Ambulatory Visit (INDEPENDENT_AMBULATORY_CARE_PROVIDER_SITE_OTHER): Payer: Medicare Other | Admitting: *Deleted

## 2010-12-01 DIAGNOSIS — I6350 Cerebral infarction due to unspecified occlusion or stenosis of unspecified cerebral artery: Secondary | ICD-10-CM

## 2010-12-13 ENCOUNTER — Other Ambulatory Visit: Payer: Self-pay | Admitting: Endocrinology

## 2010-12-28 ENCOUNTER — Other Ambulatory Visit: Payer: Self-pay | Admitting: Cardiology

## 2010-12-29 ENCOUNTER — Ambulatory Visit (INDEPENDENT_AMBULATORY_CARE_PROVIDER_SITE_OTHER): Payer: Medicare Other | Admitting: *Deleted

## 2010-12-29 DIAGNOSIS — I6350 Cerebral infarction due to unspecified occlusion or stenosis of unspecified cerebral artery: Secondary | ICD-10-CM

## 2010-12-29 LAB — POCT INR: INR: 2.2

## 2011-01-08 ENCOUNTER — Other Ambulatory Visit: Payer: Self-pay | Admitting: *Deleted

## 2011-01-08 MED ORDER — FUROSEMIDE 20 MG PO TABS: 20.0000 mg | ORAL_TABLET | ORAL | Status: DC

## 2011-01-10 ENCOUNTER — Other Ambulatory Visit: Payer: Self-pay | Admitting: Cardiology

## 2011-01-11 ENCOUNTER — Telehealth: Payer: Self-pay | Admitting: Pharmacist

## 2011-01-11 NOTE — Telephone Encounter (Signed)
Pt's daughter called to report her mom has been complaining of "bleeding in her eye" and a nosebleed for the past day or so.  She fell earlier this week and hit her head.  She did not mention her mother had any change in vision, HA, etc.  Offered appt to check INR today but they could not get to office in time.  Suggested she go to local urgent care or ER for evaluation.  Pt's daughter agreed.

## 2011-01-17 ENCOUNTER — Encounter: Payer: Self-pay | Admitting: Endocrinology

## 2011-01-17 ENCOUNTER — Ambulatory Visit (INDEPENDENT_AMBULATORY_CARE_PROVIDER_SITE_OTHER): Payer: Medicare Other | Admitting: *Deleted

## 2011-01-17 ENCOUNTER — Inpatient Hospital Stay (HOSPITAL_COMMUNITY)
Admission: AD | Admit: 2011-01-17 | Discharge: 2011-01-21 | DRG: 812 | Disposition: A | Discharge status: Home / Self Care | Payer: Medicare Other | Source: Ambulatory Visit | Attending: Family Medicine | Admitting: Family Medicine

## 2011-01-17 ENCOUNTER — Ambulatory Visit (INDEPENDENT_AMBULATORY_CARE_PROVIDER_SITE_OTHER): Payer: Medicare Other | Admitting: Endocrinology

## 2011-01-17 VITALS — BP 177/70 | HR 69 | Temp 98.0°F | Ht 64.0 in | Wt 270.8 lb

## 2011-01-17 DIAGNOSIS — I509 Heart failure, unspecified: Secondary | ICD-10-CM | POA: Diagnosis present

## 2011-01-17 DIAGNOSIS — N179 Acute kidney failure, unspecified: Secondary | ICD-10-CM | POA: Diagnosis not present

## 2011-01-17 DIAGNOSIS — Z7982 Long term (current) use of aspirin: Secondary | ICD-10-CM

## 2011-01-17 DIAGNOSIS — T792XXA Traumatic secondary and recurrent hemorrhage and seroma, initial encounter: Secondary | ICD-10-CM | POA: Diagnosis present

## 2011-01-17 DIAGNOSIS — G4733 Obstructive sleep apnea (adult) (pediatric): Secondary | ICD-10-CM | POA: Diagnosis present

## 2011-01-17 DIAGNOSIS — F329 Major depressive disorder, single episode, unspecified: Secondary | ICD-10-CM | POA: Diagnosis present

## 2011-01-17 DIAGNOSIS — E875 Hyperkalemia: Secondary | ICD-10-CM | POA: Diagnosis not present

## 2011-01-17 DIAGNOSIS — E785 Hyperlipidemia, unspecified: Secondary | ICD-10-CM | POA: Diagnosis present

## 2011-01-17 DIAGNOSIS — L719 Rosacea, unspecified: Secondary | ICD-10-CM | POA: Diagnosis present

## 2011-01-17 DIAGNOSIS — R1310 Dysphagia, unspecified: Secondary | ICD-10-CM | POA: Diagnosis present

## 2011-01-17 DIAGNOSIS — Z7901 Long term (current) use of anticoagulants: Secondary | ICD-10-CM

## 2011-01-17 DIAGNOSIS — G479 Sleep disorder, unspecified: Secondary | ICD-10-CM

## 2011-01-17 DIAGNOSIS — W06XXXA Fall from bed, initial encounter: Secondary | ICD-10-CM | POA: Diagnosis present

## 2011-01-17 DIAGNOSIS — E1129 Type 2 diabetes mellitus with other diabetic kidney complication: Secondary | ICD-10-CM | POA: Diagnosis present

## 2011-01-17 DIAGNOSIS — Z9181 History of falling: Secondary | ICD-10-CM

## 2011-01-17 DIAGNOSIS — Z794 Long term (current) use of insulin: Secondary | ICD-10-CM

## 2011-01-17 DIAGNOSIS — S0003XA Contusion of scalp, initial encounter: Secondary | ICD-10-CM | POA: Diagnosis present

## 2011-01-17 DIAGNOSIS — S301XXA Contusion of abdominal wall, initial encounter: Secondary | ICD-10-CM | POA: Diagnosis present

## 2011-01-17 DIAGNOSIS — N183 Chronic kidney disease, stage 3 unspecified: Secondary | ICD-10-CM | POA: Diagnosis present

## 2011-01-17 DIAGNOSIS — R221 Localized swelling, mass and lump, neck: Secondary | ICD-10-CM | POA: Diagnosis present

## 2011-01-17 DIAGNOSIS — T45515A Adverse effect of anticoagulants, initial encounter: Secondary | ICD-10-CM | POA: Diagnosis present

## 2011-01-17 DIAGNOSIS — Z79899 Other long term (current) drug therapy: Secondary | ICD-10-CM

## 2011-01-17 DIAGNOSIS — Z6830 Body mass index (BMI) 30.0-30.9, adult: Secondary | ICD-10-CM

## 2011-01-17 DIAGNOSIS — E11319 Type 2 diabetes mellitus with unspecified diabetic retinopathy without macular edema: Secondary | ICD-10-CM | POA: Diagnosis present

## 2011-01-17 DIAGNOSIS — E1139 Type 2 diabetes mellitus with other diabetic ophthalmic complication: Secondary | ICD-10-CM | POA: Diagnosis present

## 2011-01-17 DIAGNOSIS — Z8673 Personal history of transient ischemic attack (TIA), and cerebral infarction without residual deficits: Secondary | ICD-10-CM

## 2011-01-17 DIAGNOSIS — R296 Repeated falls: Secondary | ICD-10-CM | POA: Insufficient documentation

## 2011-01-17 DIAGNOSIS — D62 Acute posthemorrhagic anemia: Principal | ICD-10-CM | POA: Diagnosis present

## 2011-01-17 DIAGNOSIS — Y998 Other external cause status: Secondary | ICD-10-CM

## 2011-01-17 DIAGNOSIS — I6350 Cerebral infarction due to unspecified occlusion or stenosis of unspecified cerebral artery: Secondary | ICD-10-CM

## 2011-01-17 DIAGNOSIS — Z951 Presence of aortocoronary bypass graft: Secondary | ICD-10-CM

## 2011-01-17 DIAGNOSIS — I2510 Atherosclerotic heart disease of native coronary artery without angina pectoris: Secondary | ICD-10-CM | POA: Diagnosis present

## 2011-01-17 DIAGNOSIS — Z91041 Radiographic dye allergy status: Secondary | ICD-10-CM

## 2011-01-17 DIAGNOSIS — K029 Dental caries, unspecified: Secondary | ICD-10-CM | POA: Diagnosis present

## 2011-01-17 DIAGNOSIS — S8010XA Contusion of unspecified lower leg, initial encounter: Secondary | ICD-10-CM | POA: Diagnosis present

## 2011-01-17 DIAGNOSIS — Y92009 Unspecified place in unspecified non-institutional (private) residence as the place of occurrence of the external cause: Secondary | ICD-10-CM

## 2011-01-17 DIAGNOSIS — D689 Coagulation defect, unspecified: Secondary | ICD-10-CM

## 2011-01-17 DIAGNOSIS — N058 Unspecified nephritic syndrome with other morphologic changes: Secondary | ICD-10-CM | POA: Diagnosis present

## 2011-01-17 DIAGNOSIS — R220 Localized swelling, mass and lump, head: Secondary | ICD-10-CM | POA: Diagnosis present

## 2011-01-17 DIAGNOSIS — F3289 Other specified depressive episodes: Secondary | ICD-10-CM | POA: Diagnosis present

## 2011-01-17 HISTORY — DX: Sleep disorder, unspecified: G47.9

## 2011-01-17 LAB — CBC WITH DIFFERENTIAL/PLATELET
Basophils Relative: 0.3 % (ref 0.0–3.0)
Eosinophils Relative: 2.4 % (ref 0.0–5.0)
HCT: 33.9 % — ABNORMAL LOW (ref 36.0–46.0)
Hemoglobin: 11.2 g/dL — ABNORMAL LOW (ref 12.0–15.0)
Lymphs Abs: 1.6 10*3/uL (ref 0.7–4.0)
MCV: 95.3 fl (ref 78.0–100.0)
Monocytes Absolute: 0.7 10*3/uL (ref 0.1–1.0)
RBC: 3.55 Mil/uL — ABNORMAL LOW (ref 3.87–5.11)
WBC: 9.6 10*3/uL (ref 4.5–10.5)

## 2011-01-17 LAB — PROTIME-INR
INR: >10 (ref 0.00–1.49)
Prothrombin Time: >90 seconds — ABNORMAL HIGH (ref 11.6–15.2)

## 2011-01-17 LAB — COMPREHENSIVE METABOLIC PANEL
AST: 49 U/L — ABNORMAL HIGH (ref 0–37)
CO2: 25 mEq/L (ref 19–32)
Calcium: 9.9 mg/dL (ref 8.4–10.5)
Creatinine, Ser: 1.51 mg/dL — ABNORMAL HIGH (ref 0.50–1.10)
GFR calc Af Amer: 42 mL/min — ABNORMAL LOW (ref >90)
GFR calc non Af Amer: 36 mL/min — ABNORMAL LOW (ref >90)

## 2011-01-17 LAB — CBC
MCH: 31.1 pg (ref 26.0–34.0)
MCV: 94.4 fL (ref 78.0–100.0)
Platelets: 204 10*3/uL (ref 150–400)
RBC: 3.54 MIL/uL — ABNORMAL LOW (ref 3.87–5.11)

## 2011-01-17 LAB — GLUCOSE, CAPILLARY: Glucose-Capillary: 115 mg/dL — ABNORMAL HIGH (ref 70–99)

## 2011-01-17 LAB — HEMOGLOBIN AND HEMATOCRIT, BLOOD
Hemoglobin: 10.8 g/dL — ABNORMAL LOW (ref 12.0–15.0)
Hemoglobin: 10.3 g/dL — ABNORMAL LOW (ref 12.0–15.0)

## 2011-01-17 LAB — ABO/RH: ABO/RH(D): O POS

## 2011-01-17 NOTE — Progress Notes (Signed)
Subjective:    Patient ID: [name], female    DOB: 04-18-1950, 60 y.o.   MRN: [id number]  HPI Pt is here with dtr, who says pt is very restless when she sleeps.  12 days ago, she fell out of bed.  Since then, she had slight bruising around her right eye, and slight assoc pain.   Since then, she has developed a large bruise at the right face, and assoc pain.  She has fallen several times since then, but she has had no injury to her face since the fall out of bed.  One fall was in the bathroom, and one was in the hallway, both at home.  Past Medical History  Diagnosis Date  . CHF (congestive heart failure)     EF 25%  . Cholelithiasis   . Diabetic retinopathy   . Diabetic nephropathy   . Hyperlipemia   . Diabetes mellitus   . Depression   . CAD (coronary artery disease)   . CVA (cerebral vascular accident)     Past Surgical History  Procedure Date  . Tonsillectomy   . Adenoidectomy   . Coronary artery bypass graft     X 5    History   Social History  . Marital Status: Divorced    Spouse Name: N/A    Number of Children: 1  . Years of Education: N/A   Occupational History  . Teacher    Social History Main Topics  . Smoking status: Former Smoker    Quit date: 09/13/1980  . Smokeless tobacco: Not on file  . Alcohol Use: No  . Drug Use: No  . Sexually Active: Not on file   Other Topics Concern  . Not on file   Social History Narrative  . No narrative on file    Current Outpatient Prescriptions on File Prior to Visit  Medication Sig Dispense Refill  . Ascorbic Acid (VITAMIN C) 500 MG tablet Take 500 mg by mouth daily.        Marland Kitchen aspirin 81 MG tablet Take 81 mg by mouth daily.        . bisoprolol-hydrochlorothiazide (ZIAC) 10-6.25 MG per tablet TAKE ONE TABLET BY MOUTH ONE TIME DAILY  90 tablet  1  . cholecalciferol (VITAMIN D) 1000 UNIT tablet Take 1,000 Units by mouth daily.        . Emollient (CERAVE) CREA Apply topically as directed.        .  enalapril (VASOTEC) 20 MG tablet TAKE ONE TABLET BY MOUTH TWICE DAILY  180 tablet  1  . fish oil-omega-3 fatty acids 1000 MG capsule Take 1 g by mouth daily.        . furosemide (LASIX) 20 MG tablet Take 1 tablet (20 mg total) by mouth every other day.  90 tablet  2  . hydrALAZINE (APRESOLINE) 25 MG tablet TAKE ONE TABLET BY MOUTH TWICE DAILY  60 tablet  5  . insulin NPH-insulin regular (HUMULIN 70/30) (70-30) 100 UNIT/ML injection Inject subcutaneously 30 units with breakfast and 25 units with evening meal  3 vial  3  . metroNIDAZOLE (METROGEL) 0.75 % gel Apply topically as directed.        . Multiple Vitamin (MULTIVITAMIN) tablet Take 1 tablet by mouth daily.        . simvastatin (ZOCOR) 40 MG tablet TAKE ONE TABLET BY MOUTH ONE TIME DAILY  30 tablet  5  . warfarin (COUMADIN) 5 MG tablet TAKE AS INSTRUCTED BY PHYSICIAN AND/OR PACKAGE  INSTRUCTIONS.  30 tablet  3    Allergies  Allergen Reactions  . Okra     Family History  Problem Relation Age of Onset  . Hypertension Mother   . Diabetes Mother   . Heart disease Mother   . Hypertension Father   . Heart disease Father     Cardiovascular accident   BP 177/70  Pulse 69  Temp(Src) 98 F (36.7 C) (Oral)  Ht 5\' 4"  (1.626 m)  Wt 270 lb 12.8 oz (122.834 kg)  BMI 46.48 kg/m2  SpO2 95%  Review of Systems  Gastrointestinal: Negative for blood in stool.  Genitourinary: Negative for hematuria.  Neurological: Negative for syncope.   headache is resolved    Objective:   Physical Exam bp supine is 120/70 Sitting is 100/70 GENERAL: no distress.  Obese Msk: pt is unable to stand without assistance. Skin: pale. There are large ecchymoses at the right face, left forearm, right elbow, abdomen, right thigh, and left foot.     Labs: inr was high this am.      Assessment & Plan:  Coagulopathy, on coumadin.  It is unclear why inr is so high Ecchymoses, due to the coagulopathy Falls, ? Severe anemia Postural hypotension, ? Related to  anemia.  Sleep disorder.  uncertain etiology

## 2011-01-17 NOTE — Patient Instructions (Addendum)
Refer to a sleep specialist.  you will receive a phone call, about a day and time for an appointment. (update:  i paged hospitalist.  They called back, and said to send pt over

## 2011-01-18 ENCOUNTER — Inpatient Hospital Stay (HOSPITAL_COMMUNITY): Payer: Medicare Other

## 2011-01-18 ENCOUNTER — Telehealth: Payer: Self-pay | Admitting: *Deleted

## 2011-01-18 LAB — PROTIME-INR
INR: 6.74 (ref 0.00–1.49)
Prothrombin Time: 59.5 seconds — ABNORMAL HIGH (ref 11.6–15.2)

## 2011-01-18 LAB — HEMOGLOBIN AND HEMATOCRIT, BLOOD: HCT: 30.9 % — ABNORMAL LOW (ref 36.0–46.0)

## 2011-01-18 LAB — CBC
HCT: 30.8 % — ABNORMAL LOW (ref 36.0–46.0)
Hemoglobin: 10 g/dL — ABNORMAL LOW (ref 12.0–15.0)
MCH: 31.1 pg (ref 26.0–34.0)
MCHC: 32.8 g/dL (ref 30.0–36.0)
Platelets: 221 10*3/uL (ref 150–400)
Platelets: 237 10*3/uL (ref 150–400)
RBC: 3.05 MIL/uL — ABNORMAL LOW (ref 3.87–5.11)
RBC: 3.19 MIL/uL — ABNORMAL LOW (ref 3.87–5.11)
RDW: 14 % (ref 11.5–15.5)
RDW: 14.3 % (ref 11.5–15.5)
WBC: 8.8 10*3/uL (ref 4.0–10.5)
WBC: 9.8 10*3/uL (ref 4.0–10.5)

## 2011-01-18 LAB — COMPREHENSIVE METABOLIC PANEL
ALT: 30 U/L (ref 0–35)
Calcium: 9.3 mg/dL (ref 8.4–10.5)
Creatinine, Ser: 1.51 mg/dL — ABNORMAL HIGH (ref 0.50–1.10)
GFR calc Af Amer: 42 mL/min — ABNORMAL LOW (ref >90)
Glucose, Bld: 151 mg/dL — ABNORMAL HIGH (ref 70–99)
Sodium: 134 mEq/L — ABNORMAL LOW (ref 135–145)
Total Protein: 7.1 g/dL (ref 6.0–8.3)

## 2011-01-18 LAB — PHOSPHORUS: Phosphorus: 4.1 mg/dL (ref 2.3–4.6)

## 2011-01-18 LAB — DIFFERENTIAL
Basophils Absolute: 0 10*3/uL (ref 0.0–0.1)
Basophils Relative: 0 % (ref 0–1)
Eosinophils Relative: 2 % (ref 0–5)
Monocytes Absolute: 0.8 10*3/uL (ref 0.1–1.0)

## 2011-01-18 LAB — MAGNESIUM: Magnesium: 1.8 mg/dL (ref 1.5–2.5)

## 2011-01-18 LAB — GLUCOSE, CAPILLARY
Glucose-Capillary: 199 mg/dL — ABNORMAL HIGH (ref 70–99)
Glucose-Capillary: 110 mg/dL — ABNORMAL HIGH (ref 70–99)

## 2011-01-18 NOTE — Telephone Encounter (Signed)
i called [facility].  hospilalist is seeing her now

## 2011-01-18 NOTE — Telephone Encounter (Signed)
Per RN at [facility], pt is having trouble swallowing and they want to know if an order for a swallowing test needs to be done. RN states that PCP is listed as attending physician and that he must contact Hospitalist to transfer care over.

## 2011-01-19 ENCOUNTER — Encounter: Payer: Medicare Other | Admitting: *Deleted

## 2011-01-19 LAB — CBC
HCT: 27.4 % — ABNORMAL LOW (ref 36.0–46.0)
Hemoglobin: 9.1 g/dL — ABNORMAL LOW (ref 12.0–15.0)
Hemoglobin: 9.1 g/dL — ABNORMAL LOW (ref 12.0–15.0)
MCH: 31.7 pg (ref 26.0–34.0)
MCH: 31.7 pg (ref 26.0–34.0)
MCH: 31.3 pg (ref 26.0–34.0)
MCHC: 33.5 g/dL (ref 30.0–36.0)
MCHC: 33.2 g/dL (ref 30.0–36.0)
MCV: 94.8 fL (ref 78.0–100.0)
MCV: 95.5 fL (ref 78.0–100.0)
MCV: 96.6 fL (ref 78.0–100.0)
Platelets: 221 10*3/uL (ref 150–400)
Platelets: 219 10*3/uL (ref 150–400)
RBC: 2.9 MIL/uL — ABNORMAL LOW (ref 3.87–5.11)
RBC: 2.87 MIL/uL — ABNORMAL LOW (ref 3.87–5.11)
RBC: 2.91 MIL/uL — ABNORMAL LOW (ref 3.87–5.11)
RDW: 14.3 % (ref 11.5–15.5)
WBC: 9.1 10*3/uL (ref 4.0–10.5)

## 2011-01-19 LAB — GLUCOSE, CAPILLARY
Glucose-Capillary: 84 mg/dL (ref 70–99)
Glucose-Capillary: 173 mg/dL — ABNORMAL HIGH (ref 70–99)

## 2011-01-19 LAB — PREPARE FRESH FROZEN PLASMA: Unit division: 0

## 2011-01-19 LAB — PROTIME-INR: Prothrombin Time: 19.5 seconds — ABNORMAL HIGH (ref 11.6–15.2)

## 2011-01-20 LAB — GLUCOSE, CAPILLARY
Glucose-Capillary: 109 mg/dL — ABNORMAL HIGH (ref 70–99)
Glucose-Capillary: 228 mg/dL — ABNORMAL HIGH (ref 70–99)

## 2011-01-20 LAB — DIFFERENTIAL
Basophils Absolute: 0 10*3/uL (ref 0.0–0.1)
Basophils Relative: 0 % (ref 0–1)
Lymphocytes Relative: 25 % (ref 12–46)
Monocytes Absolute: 0.9 10*3/uL (ref 0.1–1.0)
Monocytes Relative: 9 % (ref 3–12)
Neutro Abs: 6 10*3/uL (ref 1.7–7.7)
Neutrophils Relative %: 62 % (ref 43–77)

## 2011-01-20 LAB — CBC
HCT: 26.9 % — ABNORMAL LOW (ref 36.0–46.0)
Hemoglobin: 8.9 g/dL — ABNORMAL LOW (ref 12.0–15.0)
RBC: 2.78 MIL/uL — ABNORMAL LOW (ref 3.87–5.11)

## 2011-01-20 LAB — BASIC METABOLIC PANEL
BUN: 54 mg/dL — ABNORMAL HIGH (ref 6–23)
CO2: 26 mEq/L (ref 19–32)
Glucose, Bld: 108 mg/dL — ABNORMAL HIGH (ref 70–99)
Potassium: 4.3 mEq/L (ref 3.5–5.1)
Sodium: 134 mEq/L — ABNORMAL LOW (ref 135–145)

## 2011-01-21 LAB — CBC
HCT: 27.9 % — ABNORMAL LOW (ref 36.0–46.0)
Hemoglobin: 9.2 g/dL — ABNORMAL LOW (ref 12.0–15.0)
RBC: 2.91 MIL/uL — ABNORMAL LOW (ref 3.87–5.11)
RDW: 14.6 % (ref 11.5–15.5)
WBC: 8.6 10*3/uL (ref 4.0–10.5)

## 2011-01-21 LAB — CROSSMATCH: Unit division: 0

## 2011-01-21 LAB — GLUCOSE, CAPILLARY
Glucose-Capillary: 178 mg/dL — ABNORMAL HIGH (ref 70–99)
Glucose-Capillary: 161 mg/dL — ABNORMAL HIGH (ref 70–99)

## 2011-01-21 LAB — OCCULT BLOOD X 1 CARD TO LAB, STOOL: Fecal Occult Bld: NEGATIVE

## 2011-01-22 NOTE — H&P (Signed)
NAME:  [name], [name]       ACCOUNT NO.:  [id number]  MEDICAL RECORD NO.:  [id number]  LOCATION:  1406                         FACILITY:  [facility]  PHYSICIAN:  [name] Warriner, DO         DATE OF BIRTH:  [birth year]  DATE OF ADMISSION:  01/17/2011 DATE OF DISCHARGE:                             HISTORY & PHYSICAL   CHIEF COMPLAINT:  Multiple ecchymosis, weakness, shortness of breath.  HISTORY OF PRESENT ILLNESS:  The patient is a 60 year old female who is on Coumadin for stroke prophylaxis.  The patient states ordinarily her INR is very consistent.  About 2 weeks ago, the patient states she fell out of bed and she had some slight bruising at that time, but in the last 2 days those areas of bruising have become quite a bit worse.  She has developed a quite large hematoma in her right jaw as well as bruising on her abdomen and on her legs.  Today, she has been short of breath, dizzy, weak.  She had a PT/INR checked and PT was found to be greater than 90.  INR greater than 10.  The patient denies any chest pain.  She has had diaphoresis.  She has had some nausea.  PAST MEDICAL HISTORY:  Significant for congestive heart failure with an ejection fraction of 25%, usually well compensated maybe she may have some decompensation at this time.  Cholelithiasis, diabetic retinopathy, diabetic nephropathy, hyperlipidemia, diabetes mellitus, depression, coronary artery disease, stroke.  PAST SURGICAL HISTORY:  Tonsil and adenoids, 5-vessel CABG.  SOCIAL HISTORY:  The patient quit smoking 30 years ago.  No alcohol.  No recreational drug use.  REVIEW OF SYSTEMS:  CONSTITUTIONAL:  Negative for fever.  Negative for chills.  Positive for weakness.  Positive for fatigue.  Positive for dizziness.  CNS: No loss of consciousness.  Positive for weakness. Positive for dizziness.  Negative for specific limb weakness.  ENT: No nasal congestion.  No throat pain.  No coryza.  CARDIOVASCULAR:  No chest  pain.  No palpitations.  Positive for orthopnea.  RESPIRATORY: Positive for shortness breath.  Negative for cough.  Negative for wheeze.  GASTROINTESTINAL:  Positive for nausea.  Negative for vomiting. Negative for constipation.  Negative for diarrhea.  Negative for melena. Negative for hematochezia.  Negative for coffee-ground emesis or hematemesis.  GENITOURINARY: No dysuria, no hematuria, no urinary frequency.  RENAL:  No flank pain.  No swelling or nephritis.  SKIN: Positive for ecchymoses and hematomas all over the anterior surfaces of her body, strangely now she does not appear to have any on the posterior surfaces upper body.  No rashes, no sores.  HEMATOLOGIC:  Positive for severe and large bruising all over the front of her body, including a very large ecchymotic area to her left jaw and her abdomen and her legs. Negative for clots.  LYMPH:  No lymphadenopathy.  No painful nodes or specific lymph swelling.  PSYCHIATRIC:  No anxiety.  No depression or insomnia.  PHYSICAL EXAMINATION:  VITAL SIGNS:  Temperature 97.6, pulse 64, respiratory rate 18, blood pressure 144/58, O2 sat 100% on room air. GENERAL: The patient is morbidly obese and chronically ill appearing. She is awake, alert, and ordered x3.  She allows her daughter to give most of the history as the patient appears to be in mild distress from discomfort. EYES:  Pupils equal, round and reactive to light and accommodation. Right sclera is injected apparently from this fall out of bed 2 weeks ago.  Daughter states it is improved.  The patient has ecchymoses about her left eye. MOUTH: mucosa is dry.  No lesions.  No sores.  Pharynx clear No erythema. NECK:  Positive for ecchymoses on the left jaw extending down to the anterior surface of the neck on the left side.  No stridor.  No lymphadenopathy.  No thyromegaly.  Unable to evaluate JVD secondary to body habitus and hematoma. HEART:  Regular rate and rhythm at 70 beats  per minute without murmur, ectopy, or gallops.  No lateral PMI.  No thrills. LUNGS: Clear to auscultation bilaterally without wheezes, rales, or rhonchi.  No increased work of breathing.  No tactile fremitus. ABDOMEN:  Morbidly obese, soft, positive for various tender areas where there are ecchymosis.  Otherwise, no tenderness.  Positive bowel sounds. No hepatosplenomegaly is appreciated, but this is due to body habitus. There is no flank pain.  No back pain. EXTREMITIES:  Lower extremities have 2 to 3+ pitting edema bilaterally, is brawny in character.  Distal pulses and popliteal pulses are bilaterally present.  No carotid bruits bilaterally. NEUROLOGICAL:  Cranial nerves II-XII gross intact.  Motor and sensory intact.  LABORATORY DATA:  WBC 7.6, hemoglobin 11.2, hematocrit 33.9, platelets 234.  PT is greater than 90, INR greater than 10.  She is satting 100% on room air.  Sodium 135, potassium 4.9, chloride 102, CO2 of 25, BUN 35, creatinine 1.51, glucose 94, total protein 7.5, AST 49, ALT 28, albumin 3.6, calcium 9.9.  ASSESSMENT: 1. Acute blood loss anemia secondary to hemorrhaging. 2. Supratherapeutic INR secondary to Coumadin. 3. Congestive heart failure with ejection fraction of 25%.  The     patient appears to be in some decompensation.  With regard to her     congestive heart failure at this time, we will check a BNP and     monitor her I and O carefully. 4. Diabetes mellitus with nephropathy and retinopathy. 5. Coronary artery disease. 6. History of stroke. 7. Hyperlipidemia.  PLAN: 1. We will give p.o. vitamin K. 2. We will hold her Coumadin. 3. We will follow her H and H and PT/INR and transfuse her if     necessary. 4. We will monitor her volume closely.  I have spent 45 minutes in this admission.          ______________________________ [name] Lowes, DO     AS/MEDQ  D:  01/17/2011  T:  01/17/2011  Job:  [id number]  cc:   [name] All, MD [address] [place] [address]  Electronically Signed by [name] Lowes DO on 01/22/2011 03:48:25 PM

## 2011-01-22 NOTE — H&P (Signed)
  NAME:  [name], Emireth       ACCOUNT NO.:  [id number]  MEDICAL RECORD NO.:  [id number]  LOCATION:  1406                         FACILITY:  [facility]  PHYSICIAN:  Dmario Russom, DO         DATE OF BIRTH:  06-06-1950  DATE OF ADMISSION:  01/17/2011 DATE OF DISCHARGE:                             HISTORY & PHYSICAL   ADDENDUM:  The patient's current medications include: 1. Tylenol Arthritis 2 tablets every 4 hours as needed for pain. 2. Tylenol Extra Strength 500 mg 2 tablets every 4 hours as needed for     pain. 3. Neosporin ointment topically apply once daily to affected areas. 4. MetroCream topically 1 application daily as needed. 5. Novolin 70/30, 25 to 30 units twice daily.  She takes 30 units in     the morning and 25 units at night. 6. Bisoprolol and hydrochlorothiazide 10/6.25 one p.o. daily. 7. Warfarin 5 mg 1 p.o. daily. 8. Ampicillin 500 mg 1 p.o. every other day. 9. Enalapril 20 mg 1 p.o. b.i.d. 10.She also takes hydralazine 25 mg 1 p.o. b.i.d. 11.Lasix 20 mg 1 p.o. every other day. 12.Simvastatin 40 mg 1 p.o. daily. 13.Gummy calcium 2 tablets daily. 14.Fiber gummy 2 tablets daily. 15.Multivitamin gummy 1 tablet daily. 16.Vitamin D3 2000 international units daily. 17.Vitamin C gummy 2 tablets daily. 18.Vitamin E 400 international units 1 capsule daily. 19.Aspirin 81 mg 1 p.o. daily.  ALLERGIES: 1. CONTRAST MEDIA. 2. OKRA. 3. CLONIDINE.          ______________________________ Fran Lowes, DO     AS/MEDQ  D:  01/17/2011  T:  01/17/2011  Job:  [id number]  Electronically Signed by Fran Lowes DO on 01/22/2011 03:48:28 PM

## 2011-01-30 ENCOUNTER — Ambulatory Visit (INDEPENDENT_AMBULATORY_CARE_PROVIDER_SITE_OTHER): Payer: Medicare Other | Admitting: Endocrinology

## 2011-01-30 ENCOUNTER — Encounter: Payer: Self-pay | Admitting: Endocrinology

## 2011-01-30 ENCOUNTER — Other Ambulatory Visit (INDEPENDENT_AMBULATORY_CARE_PROVIDER_SITE_OTHER): Payer: Medicare Other

## 2011-01-30 DIAGNOSIS — E1129 Type 2 diabetes mellitus with other diabetic kidney complication: Secondary | ICD-10-CM

## 2011-01-30 DIAGNOSIS — E119 Type 2 diabetes mellitus without complications: Secondary | ICD-10-CM

## 2011-01-30 DIAGNOSIS — R233 Spontaneous ecchymoses: Secondary | ICD-10-CM | POA: Insufficient documentation

## 2011-01-30 LAB — CBC WITH DIFFERENTIAL/PLATELET
Basophils Absolute: 0.1 10*3/uL (ref 0.0–0.1)
Eosinophils Absolute: 0.8 10*3/uL — ABNORMAL HIGH (ref 0.0–0.7)
HCT: 31.9 % — ABNORMAL LOW (ref 36.0–46.0)
Lymphs Abs: 1.6 10*3/uL (ref 0.7–4.0)
MCV: 99.2 fl (ref 78.0–100.0)
Monocytes Absolute: 0.7 10*3/uL (ref 0.1–1.0)
Monocytes Relative: 7.9 % (ref 3.0–12.0)
Platelets: 294 10*3/uL (ref 150.0–400.0)
RDW: 17.4 % — ABNORMAL HIGH (ref 11.5–14.6)

## 2011-01-30 LAB — BASIC METABOLIC PANEL
BUN: 33 mg/dL — ABNORMAL HIGH (ref 6–23)
Chloride: 107 mEq/L (ref 96–112)
Creatinine, Ser: 1.5 mg/dL — ABNORMAL HIGH (ref 0.4–1.2)

## 2011-01-30 LAB — HEMOGLOBIN A1C: Hgb A1c MFr Bld: 6.4 % (ref 4.6–6.5)

## 2011-01-30 LAB — IBC PANEL: Iron: 63 ug/dL (ref 42–145)

## 2011-01-30 MED ORDER — ENALAPRIL MALEATE 10 MG PO TABS: 10.0000 mg | ORAL_TABLET | Freq: Every day | ORAL | Status: DC

## 2011-01-30 NOTE — Progress Notes (Signed)
Subjective:    Patient ID: [name], female    DOB: 09-24-1950, 60 y.o.   MRN: [id number]  HPI The state of at least three ongoing medical problems is addressed today: Hyperkalemia: she takes vasotec as rx'ed, for htn and chf, which are well-controlled Anemia: Pt was d/c'ed from hosp, where coumadin was d/c'ed.  No further bleeding.   DM: she brings a record of her cbg's which i have reviewed today.  It varies from 65-200.  There is no trend throughout the day. Past Medical History  Diagnosis Date  . CHF (congestive heart failure)     EF 25%  . Cholelithiasis   . Diabetic retinopathy   . Diabetic nephropathy   . Hyperlipemia   . Diabetes mellitus   . Depression   . CAD (coronary artery disease)   . CVA (cerebral vascular accident)     Past Surgical History  Procedure Date  . Tonsillectomy   . Adenoidectomy   . Coronary artery bypass graft     X 5    History   Social History  . Marital Status: Divorced    Spouse [name]: N/A    Number of Children: 1  . Years of Education: N/A   Occupational History  . Teacher    Social History Main Topics  . Smoking status: Former Smoker    Quit date: 09/13/1980  . Smokeless tobacco: Not on file  . Alcohol Use: No  . Drug Use: No  . Sexually Active: Not on file   Other Topics Concern  . Not on file   Social History Narrative  . No narrative on file    Current Outpatient Prescriptions on File Prior to Visit  Medication Sig Dispense Refill  . Ascorbic Acid (VITAMIN C) 500 MG tablet 2 tablets by mouth once daily      . aspirin 81 MG tablet Take 81 mg by mouth daily.        . bisoprolol-hydrochlorothiazide (ZIAC) 10-6.25 MG per tablet TAKE ONE TABLET BY MOUTH ONE TIME DAILY  90 tablet  1  . Emollient (CERAVE) CREA Apply topically as directed.        . fish oil-omega-3 fatty acids 1000 MG capsule Take 1 g by mouth daily.        . furosemide (LASIX) 20 MG tablet Take 1 tablet (20 mg total) by mouth every other day.   90 tablet  2  . hydrALAZINE (APRESOLINE) 25 MG tablet TAKE ONE TABLET BY MOUTH TWICE DAILY  60 tablet  5  . metroNIDAZOLE (METROGEL) 0.75 % gel Apply topically as directed.        . Multiple Vitamin (MULTIVITAMIN) tablet Take 1 tablet by mouth daily.        . simvastatin (ZOCOR) 40 MG tablet TAKE ONE TABLET BY MOUTH ONE TIME DAILY  30 tablet  5    Allergies  Allergen Reactions  . Okra     Family History  Problem Relation Age of Onset  . Hypertension Mother   . Diabetes Mother   . Heart disease Mother   . Hypertension Father   . Heart disease Father     Cardiovascular accident    BP 131/56  Pulse 72  Temp(Src) 97.9 F (36.6 C) (Oral)  Ht 5\' 2"  (1.575 m)  Wt 276 lb 3.2 oz (125.283 kg)  BMI 50.52 kg/m2  SpO2 95%     Review of Systems Denies brbpr and hematuria.    Objective:   Physical Exam VITAL SIGNS:  See vs page GENERAL: no distress Skin: ecchymoses on the neck, abdomen, and arms are much less now.     Lab Results  Component Value Date   WBC 8.3 01/30/2011   HGB 10.7* 01/30/2011   HCT 31.9* 01/30/2011   PLT 294.0 01/30/2011   GLUCOSE 109* 01/30/2011   CHOL 153 09/14/2010   TRIG 150.0* 09/14/2010   HDL 43.90 09/14/2010   LDLDIRECT 100.9 02/03/2008   LDLCALC 79 09/14/2010   ALT 30 01/18/2011   AST 47* 01/18/2011   NA 138 01/30/2011   K 5.2* 01/30/2011   CL 107 01/30/2011   CREATININE 1.5* 01/30/2011   BUN 33* 01/30/2011   CO2 26 01/30/2011   TSH 2.13 10/07/2009   INR 1.62* 01/19/2011   HGBA1C 6.4 01/30/2011   MICROALBUR 9.6* 10/07/2009      Assessment & Plan:  Anemia, improved Mild hyperkalemia, prob exac by acei Dm is overcontrolled, given this regimen, which does match insulin to her changing needs throughout the day.

## 2011-01-30 NOTE — Patient Instructions (Addendum)
blood tests are being requested for you today.  please call [phone] to hear your test results.  You will be prompted to enter the 9-digit "MRN" number that appears at the top left of this page, followed by #.  Then you will hear the message.   Please come back for a "medicare wellness" appointment in 3 months. (update: i left message on phone-tree:  Reduce vasotec to 10-bid.  Reduce insulin to 25 am and 20 pm).

## 2011-01-31 ENCOUNTER — Encounter: Payer: Self-pay | Admitting: Physician Assistant

## 2011-01-31 ENCOUNTER — Ambulatory Visit (INDEPENDENT_AMBULATORY_CARE_PROVIDER_SITE_OTHER): Payer: Medicare Other | Admitting: Physician Assistant

## 2011-01-31 ENCOUNTER — Telehealth: Payer: Self-pay | Admitting: *Deleted

## 2011-01-31 VITALS — BP 155/69 | HR 71 | Resp 20 | Ht 62.0 in | Wt 273.0 lb

## 2011-01-31 DIAGNOSIS — I2510 Atherosclerotic heart disease of native coronary artery without angina pectoris: Secondary | ICD-10-CM

## 2011-01-31 DIAGNOSIS — I6350 Cerebral infarction due to unspecified occlusion or stenosis of unspecified cerebral artery: Secondary | ICD-10-CM

## 2011-01-31 DIAGNOSIS — R233 Spontaneous ecchymoses: Secondary | ICD-10-CM

## 2011-01-31 DIAGNOSIS — I5022 Chronic systolic (congestive) heart failure: Secondary | ICD-10-CM

## 2011-01-31 DIAGNOSIS — I10 Essential (primary) hypertension: Secondary | ICD-10-CM

## 2011-01-31 NOTE — Assessment & Plan Note (Signed)
Reason for supratherapeutic INR is uncertain.  Continue off of Coumadin for now.  Continue on aspirin.  I discussed her case with Dr. Antoine Poche.  She will be seen back in followup in the next 4-6 weeks.  We can decide whether or not to re-attempt Coumadin in the future.

## 2011-01-31 NOTE — Telephone Encounter (Signed)
Please verify that current rx is 20 mg bid Then reduce to 10 mg bid

## 2011-01-31 NOTE — Progress Notes (Signed)
History of Present Illness: Primary Cardiologist:  Dr. Rollene Rotunda  [name] is a 60 y.o. female who presents for post hospital follow up.  She has a history of CAD, status post CABG in 2001 (LIMA-LAD, SVG-diagonal, SVG-OM 2, SVG-A.M./left PDA).  She suffered a perioperative stroke at the time of her bypass.  She has been maintained on Coumadin therapy since that time.  Last Myoview 8/11: Fixed basal to mid inferior defect consistent with scar, apical inferior and apical anterior ischemia, EF 35% (low risk).  She has an ischemic cardiomyopathy.  Last echocardiogram 1/09: EF 20-25%, mitral valve calcification, mild-moderate MR, mild LAE, RVSF mildly decreased, PASP mildly increased, mild-moderate TR.  She has chronic systolic heart failure.  She has refused ICD implantation in the past.  Other history includes hypertension, hyperlipidemia, diabetes, chronic kidney disease.  She was admitted 10/3-10/7.  She suffered significant facial bruising secondary to facial trauma.  She was noted to have significant coagulopathy with an INR over 10 upon admission.  She had apparently fallen out of bed several days prior.  She does admit to significant problems with imbalance since her stroke.  Her INRs are typically within range.  She denies starting any new medications prior to this episode.  Her hemoglobin did drop from 11.2-9.2.  Her Coumadin was reversed and her INR dropped quickly.  Trauma service saw the patient.  She had a CT scan of her neck demonstrated some inflammatory areas.  She did have some difficulty with swallowing but eventually resolved.  The attending service spoke with Dr. Dietrich Pates who was on call.  He recommended that she remain on aspirin only until she was seen in followup with her primary cardiologist.  Her bruising on her face is improving.  Overall she feels well.  She denies significant changes in her shortness of breath.  She denies chest pain.  She denies orthopnea, PND or  edema.  She denies any facial droop or unilateral weakness.  She denies syncope.  She apparently has a history of snoring.  She followed up with her PCP this week.  A followup hemoglobin was stable.  She is being referred for testing for sleep apnea.  Past Medical History  Diagnosis Date  . CHF (congestive heart failure)     EF 25%  . Cholelithiasis   . Diabetic retinopathy   . Diabetic nephropathy   . Hyperlipemia   . Diabetes mellitus   . Depression   . CAD (coronary artery disease)   . CVA (cerebral vascular accident)     Current Outpatient Prescriptions  Medication Sig Dispense Refill  . ampicillin (PRINCIPEN) 500 MG capsule Take 500 mg by mouth daily.        . Ascorbic Acid (VITAMIN C) 500 MG tablet 2 tablets by mouth once daily      . aspirin 81 MG tablet Take 81 mg by mouth daily.        . bisoprolol-hydrochlorothiazide (ZIAC) 10-6.25 MG per tablet TAKE ONE TABLET BY MOUTH ONE TIME DAILY  90 tablet  1  . Calcium-Phosphorus-Vitamin D (CALCIUM GUMMIES PO) Take 1 tablet by mouth daily.        . Cholecalciferol (VITAMIN D3) 2000 UNITS TABS Take 1 tablet by mouth daily.        . Emollient (CERAVE) CREA Apply topically as directed.        . enalapril (VASOTEC) 10 MG tablet Take 1 tablet (10 mg total) by mouth daily.  60 tablet  11  . FIBER  SELECT GUMMIES PO Take 1 tablet by mouth daily.        . fish oil-omega-3 fatty acids 1000 MG capsule Take 1 g by mouth daily.        . furosemide (LASIX) 20 MG tablet Take 1 tablet (20 mg total) by mouth every other day.  90 tablet  2  . hydrALAZINE (APRESOLINE) 25 MG tablet TAKE ONE TABLET BY MOUTH TWICE DAILY  60 tablet  5  . insulin NPH-insulin regular (NOVOLIN 70/30) (70-30) 100 UNIT/ML injection Inject subcutaneously 25 units with breakfast and 20 units with evening meal       . metroNIDAZOLE (METROGEL) 0.75 % gel Apply topically as directed.        . Multiple Vitamin (MULTIVITAMIN) tablet Take 1 tablet by mouth daily.        . simvastatin  (ZOCOR) 40 MG tablet TAKE ONE TABLET BY MOUTH ONE TIME DAILY  30 tablet  5  . vitamin E 400 UNIT capsule Take 400 Units by mouth daily.          Allergies: Allergies  Allergen Reactions  . Okra     Social history:  Nonsmoker  Vital Signs: BP 155/69  Pulse 71  Resp 20  Ht 5\' 2"  (1.575 m)  Wt 273 lb (123.832 kg)  BMI 49.93 kg/m2  SpO2 92%  PHYSICAL EXAM: Well nourished, well developed, in no acute distress HEENT: normal; Moderate area of ecchymosis noted on her anterior neck Neck: no JVD At 90 degrees Cardiac:  normal S1, S2; RRR; no murmur Lungs:  clear to auscultation bilaterally, no wheezing, rhonchi or rales Abd: soft, nontender, no hepatomegaly Ext: Trace bilateral edema Skin: warm and dry Neuro:  CNs 2-12 intact, no focal abnormalities noted  EKG:  Sinus rhythm, heart rate 66, left axis deviation, interventricular conduction delay, No significant change  ASSESSMENT AND PLAN:

## 2011-01-31 NOTE — Patient Instructions (Signed)
Your physician recommends that you schedule a follow-up appointment in: 03/06/11 @ 10:45 TO SEE DR. HOCHREIN  NO CHANGES TODAY

## 2011-01-31 NOTE — Assessment & Plan Note (Signed)
Blood pressure was much better at her office visit with her PCP this week.  Continue to monitor.

## 2011-01-31 NOTE — Telephone Encounter (Signed)
Per pharmacy, current rx is 20mg  bid. Rx reduced to 10mg  bid.

## 2011-01-31 NOTE — Assessment & Plan Note (Signed)
Volume stable.  Continue current medications.

## 2011-01-31 NOTE — Assessment & Plan Note (Signed)
Stable.  No angina.  Continue aspirin.

## 2011-01-31 NOTE — Telephone Encounter (Signed)
[facility] called requesting clarification on Vasotec rx. They want to know if pt is to take 10mg  1 tablet daily or 1 tablet bid-please advise

## 2011-01-31 NOTE — Assessment & Plan Note (Signed)
It appears that she has been on Coumadin since her stroke at the time of her bypass.  I will continue to search her chart to see if there any other compelling reasons why she was still on Coumadin.

## 2011-02-06 ENCOUNTER — Other Ambulatory Visit: Payer: Self-pay | Admitting: Endocrinology

## 2011-02-08 NOTE — Discharge Summary (Signed)
NAME:  [name], [name]       ACCOUNT NO.:  [id number]  MEDICAL RECORD NO.:  [id number]  LOCATION:  1406                         FACILITY:  [facility]  PHYSICIAN:  Pleas Koch, MD        DATE OF BIRTH:  [birth year]  DATE OF ADMISSION:  01/17/2011 DATE OF DISCHARGE:  01/21/2011                              DISCHARGE SUMMARY   DISCHARGE DIAGNOSES: 1. Significant coagulopathy from Coumadin -- stopped Coumadin. 2. Facial trauma likely as a result of hitting her face on the head     board next to her bed resulting in facial bruising and ecchymosis -     - dental surgeon, Dr. Jeanice Lim. 3. Coronary artery disease status post 5-vessel CABG in 2001 --     followed by Dr. Antoine Poche as an outpatient -- will continue aspirin     81 mg. 4. 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 5. Congestive heart failure, euvolemic.  Last ejection fraction in     May 09, 2007, of 20% to 25%. 6. Very likely obstructive sleep apnea -- needs outpatient evaluation     and weight loss. 7. Rosacea, stable. 8. Hyperlipidemia.  Continue statin. 9. Diabetes mellitus.  CBGs are well-controlled during this     hospitalization. 10.Anemia.  Anemia is likely secondary to acute blood loss stabilized     during hospitalization.  PERTINENT IMAGING STUDIES: 1. CT maxillofacial on January 18, 2011, showing,     a.     No facial fracture.  Acute traumatic injury identified.     b.     Mild soft tissue stranding of right cheek is nonspecific,      could be associated with cellulitis or contusion.     c.     Diabetic small vessel atherosclerotic calcifications. 2. Panorex x-ray showed tooth 19 appears to demonstrate significant     dental caries and suspected periapical lucency. 3. CT neck with contrast showed,     a.     Technically limited study, mucosal symmetry with mass effect      along the right side hypopharynx, setting right piriform sinuses.     b.     Changes worrisome for inflammatory mass.  PERTINENT  CONSULTS: 1. Dr. Jeanice Lim of Maxillofacial Trauma. 2. I did curbside Dr. [place] Bing of [facility].  DISCHARGE MEDICATIONS: 1. Tylenol Extra Strength 500 mg 2 tablets q.4 h. P.r.n. 2. Enalapril 10 mg 1 tablet b.i.d. 3. Ziac 10/6.25 one tablets daily. 4. Hydralazine 25 one tablet b.i.d. 5. Ampicillin 1 capsule every other day. 6. Simvastatin 4 mg once daily. 7. Novolin 70/30 25-30 units b.i.d. 8. Lasix 20 mg every other day. 9. Aspirin 81 mg daily. 10.Calcium gummy 2 tablets daily. 11.Fiber gummy 2 tablets daily. 12.Metro cream topically 1 application daily. 13.Multivitamin gummy 1 tablet daily. 14.Neosporin ointment topically 1 application daily p.r.n. 15.Tylenol Arthritis 2 tablets q.4 h. p.r.n. 16.Vitamin C gummy 2 tablets daily. 17.Vitamin D3 2000 international units 1 tablet daily. 18.Vitamin E orally 4000 international units 1 cap daily.  Please note I have discontinued Coumadin completely.  HISTORY OF PRESENT ILLNESS:  Briefly this is a pleasant 60 year old Caucasian female with multiple medical illnesses who presented to [facility] Emergency Room on the  morning of admission with significant bruising to her face and the bruising has become quite worse.  She had a large 5 x 7 hematoma to the right jaw, as well as bruising to the abdomen and legs, and all over, and has had recent falls.  She was found to be slightly short of breath, dizzy, and had her INR checked, and this was greater than 10.  She had some nausea and came to the emergency room for the same.  PERTINENT POSITIVES ON ADMISSION:  VITAL SIGNS:  Temperature 97.6, pulse 64, respirations 18, blood pressure 144/58, and O2 sats 100% on room air. GENERAL:  She morbidly obese and chronically ill appearing.  Awake, alert, and oriented x3.  The daughter gave most of the history.  The patient seems to be in some distress. HEENT:  The patient had a large ecchymosis on the left sided jaw, down the anterior  surface of her neck on the right side.  No stridor. NECK:  No lymphadenopathy.  No thyromegaly.  Unable to appreciate JVD secondary to habitus. CARDIOVASCULAR:  Regular rate rhythm.  No PMI.  70 beats per minute. ABDOMEN:  Morbidly obese.  There are tender areas where there is ecchymoses.  No hepatosplenomegaly.  Body habitus did not allow full exam.  ASSESSMENT/PLAN: 1. Coagulopathy secondary to supratherapeutic INR and falls.  It was     noted that the patient may have hit her face against the side board     of her bed.  She had a significantly large 5 x7 swelling in what     looked like the buccal area and not the parotid area of the face.     She was not able to swallow and I was called for the bedside on the     first day of admission because she was having a little bit of     shortness of breath as she usually is a mouth breather.  Her sats     were totally normal and her INR was completely reversed from the     10, and it dropped from 6.74 the repeat to less than 1.62 on     January 19, 2011.  Because of the peculiar location of her injury,     Dr. Jeanice Lim of Face Trauma was consulted and he recommended getting     a CT of the face as well as a Panorex view which showed caries as     per above.  He also noted other caries in tooth #2 and #15 in     addition and has recommended to be placed out.  It was noted that     the CT of the neck showed a possible inflammatory area; however,     over the course of hospitalization her swallowing improved     dramatically and she tolerated full meals without any dysphagia or     any discomfort.  Hence further direct laryngoscopy was not obtained     at this time, but may be needed in the outpatient setting. 2. Anemia of blood loss.  Her blood count dropped from admission of     11.2 to a low of 9.2.  This is likely secondary to the coagulopathy     secondary to INR usage, and she was promptly discontinued off of     Coumadin. 3. Coronary  artery disease status post 5-vessel CABG in 2001.  The     patient sees Dr. Antoine Poche as an outpatient for cardiology  and was     on Coumadin for stroke prophylaxis.  On further questioning, it was     noted that the patient actually had a stroke on the table while she     was having a coronary artery bypass graft.  It is unclear what     further benefit Coumadin would give her over aspirin for thrombotic     events.  Certainly, she could be on anticoagulation which would     protect her to a higher degree; however, I did consult Dr. Dietrich Pates     of Cardiology informally and he recommended just keeping her on     aspirin for now given the coagulopathy status and followup with Dr.     Antoine Poche as an outpatient.  The patient has been instructed to see     Dr. Antoine Poche in 2 weeks. 4. Chronic kidney disease stress stage II to III.  This was a stable     problem during hospitalization and she will need a CMET probably in     about 3-7 days.  It is noted that her last creatinine was 1.60, and     she may need to hold off on her Lasix in the short-term. 5. Congestive heart failure which was euvolemic.  The last echo done     on May 09, 2007, showed an ejection fraction of 20% to 25%.     The patient will continue on Lasix at home dose.  This was     escalated slightly because it was felt that she was slightly     decompensated and we will keep her on her home dose. 6. Rosacea.  She will continue her MetroCream. 7. Hyperlipidemia.  Continue Zocor as an outpatient. 8. Transient hyperkalemia.  She was transiently hyperkalemic and this     resolved during hospitalization, likely was secondary to mild acute     kidney injury that she sustained secondary to diuresis. 9. Diabetes mellitus.  Her blood sugars were well-controlled during     hospitalization, and she was maintained on sliding-scale insulin.  The patient is seen on the day of discharge and was doing well.  She had no other significant  issues.  She ate full meals while hospitalized here.  Her temperature is of 97.7, pulse is 69-71, respirations 22-18, blood pressure 139-146/65-70, and saturating 900% on room air.  RECOMMENDATIONS: 1. The patient definitely has the habitus consistent with obstructive     sleep apnea and I would recommend getting a sleep study as an     outpatient at the earliest. 2. The patient needs to follow up with Cardiology with regards to a     Coumadin discussion. 3. The patient needs a BMET and a CBC in about 3-5 days and followup     with Dr. Everardo All. 4. The patient will likely need significant weight loss counseling     measures -- may benefit from bariatric surgery minimally invasive     given the fact that she has a BMI of over 30, and multiple     comorbidities which was thwart this from a Medicare standpoint. 5. The patient was seen by PT/OT and was cleared for discharge home.  It was a pleasure taking care of this patient.  I spent over 40 minutes in time coordinating her discharge, discussion with the patient, plan of care, and discussion with consultants.          ______________________________ Pleas Koch, MD     JS/MEDQ  D:  01/21/2011  T:  01/21/2011  Job:  [id number]  cc:   Rollene Rotunda, MD, [facility] Sean A. Everardo All, MD  Electronically Signed by Pleas Koch MD on 02/08/2011 05:19:38 AM

## 2011-02-13 ENCOUNTER — Institutional Professional Consult (permissible substitution): Payer: Medicare Other | Admitting: Pulmonary Disease

## 2011-02-23 ENCOUNTER — Telehealth: Payer: Self-pay | Admitting: *Deleted

## 2011-02-23 NOTE — Telephone Encounter (Signed)
[facility] pharmacist informed.  

## 2011-02-23 NOTE — Telephone Encounter (Signed)
[facility] pharmacy sent fax asking to switch Humulin 70/30 to Novolin 70/30 because it is now the cheaper preferred brand-please advise

## 2011-02-23 NOTE — Telephone Encounter (Signed)
Ok.  Same dosage 

## 2011-03-06 ENCOUNTER — Encounter: Payer: Self-pay | Admitting: Cardiology

## 2011-03-06 ENCOUNTER — Ambulatory Visit (INDEPENDENT_AMBULATORY_CARE_PROVIDER_SITE_OTHER): Payer: Medicare Other | Admitting: Cardiology

## 2011-03-06 DIAGNOSIS — I509 Heart failure, unspecified: Secondary | ICD-10-CM

## 2011-03-06 DIAGNOSIS — I6350 Cerebral infarction due to unspecified occlusion or stenosis of unspecified cerebral artery: Secondary | ICD-10-CM

## 2011-03-06 MED ORDER — ENALAPRIL MALEATE 2.5 MG PO TABS: 2.5000 mg | ORAL_TABLET | Freq: Two times a day (BID) | ORAL | Status: DC

## 2011-03-06 NOTE — Progress Notes (Signed)
HPI  The patient presents for followup of an elevated INR and facial bruising that occurred after a fall. She was hospitalized for this and was taken off of Coumadin. She was seen back by Mr. Alben Spittle. She has remained off of Coumadin. He and I discussed this and we reviewed the chart. The indication for Coumadin was a CVA following cath some years ago as well as her history of a cardiomyopathy. At this point she is recovered from her accident. She's having no new symptoms such as chest pressure, neck or arm discomfort. She has no new shortness of breath, PND or orthopnea. She has had no palpitations, presyncope or syncope.  Allergies  Allergen Reactions  . Okra     Current Outpatient Prescriptions  Medication Sig Dispense Refill  . ampicillin (PRINCIPEN) 500 MG capsule Take 500 mg by mouth daily.        . Ascorbic Acid (VITAMIN C) 500 MG tablet 2 tablets by mouth once daily      . aspirin 81 MG tablet Take 81 mg by mouth daily.        . bisoprolol-hydrochlorothiazide (ZIAC) 10-6.25 MG per tablet TAKE ONE TABLET BY MOUTH ONE TIME DAILY  90 tablet  1  . Calcium-Phosphorus-Vitamin D (CALCIUM GUMMIES PO) Take 1 tablet by mouth daily.        . Cholecalciferol (VITAMIN D3) 2000 UNITS TABS Take 1 tablet by mouth daily.        . Emollient (CERAVE) CREA Apply topically as directed.        . enalapril (VASOTEC) 10 MG tablet Take by mouth. 1/2 TABLET TWO TIMES DAILY       . FIBER SELECT GUMMIES PO Take 1 tablet by mouth daily.        . furosemide (LASIX) 20 MG tablet Take 1 tablet (20 mg total) by mouth every other day.  90 tablet  2  . hydrALAZINE (APRESOLINE) 25 MG tablet TAKE ONE TABLET BY MOUTH TWICE DAILY  60 tablet  5  . insulin NPH-insulin regular (NOVOLIN 70/30) (70-30) 100 UNIT/ML injection Inject subcutaneously 25 units with breakfast and 20 units with evening meal       . metroNIDAZOLE (METROGEL) 0.75 % gel Apply topically as directed.        . Multiple Vitamin (MULTIVITAMIN) tablet Take 1  tablet by mouth daily.        . simvastatin (ZOCOR) 40 MG tablet TAKE ONE TABLET BY MOUTH ONE TIME DAILY  30 tablet  5  . vitamin E 400 UNIT capsule Take 400 Units by mouth daily.          Past Medical History  Diagnosis Date  . CHF (congestive heart failure)     EF 25%  . Cholelithiasis   . Diabetic retinopathy   . Diabetic nephropathy   . Hyperlipemia   . Diabetes mellitus   . Depression   . CAD (coronary artery disease)   . CVA (cerebral vascular accident)     Past Surgical History  Procedure Date  . Tonsillectomy   . Adenoidectomy   . Coronary artery bypass graft     X 5    ROS:  As stated in the HPI and negative for all other systems.  PHYSICAL EXAM BP 150/66  Pulse 68  Ht 5\' 2"  (1.575 m)  Wt 269 lb (122.018 kg)  BMI 49.20 kg/m2 GENERAL:  Well appearing HEENT:  Pupils equal round and reactive, fundi not visualized, oral mucosa unremarkable NECK:  No jugular  venous distention, waveform within normal limits, carotid upstroke brisk and symmetric, no bruits, no thyromegaly LYMPHATICS:  No cervical, inguinal adenopathy LUNGS:  Clear to auscultation bilaterally BACK:  No CVA tenderness CHEST:  Unremarkable HEART:  PMI not displaced or sustained,S1 and S2 within normal limits, no S3, no S4, no clicks, no rubs, no murmurs ABD:  Flat, positive bowel sounds normal in frequency in pitch, no bruits, no rebound, no guarding, no midline pulsatile mass, no hepatomegaly, no splenomegaly EXT:  2 plus pulses throughout, no edema, no cyanosis no clubbing SKIN:  No rashes no nodules NEURO:  Cranial nerves II through XII grossly intact, motor grossly intact throughout PSYCH:  Cognitively intact, oriented to person place and time  ASSESSMENT AND PLAN

## 2011-03-06 NOTE — Patient Instructions (Signed)
Please increase Enalapril by 2.5 mg a day for a total of 7.5 mg twice a day  Your physician has requested that you have an echocardiogram in 2 months. Echocardiography is a painless test that uses sound waves to create images of your heart. It provides your doctor with information about the size and shape of your heart and how well your heart's chambers and valves are working. This procedure takes approximately one hour. There are no restrictions for this procedure.  Follow up with Dr Antoine Poche in 2 months.

## 2011-03-06 NOTE — Assessment & Plan Note (Signed)
The patient has no new sypmtoms.  No further cardiovascular testing is indicated.  We will continue with aggressive risk reduction and meds as listed.  

## 2011-03-06 NOTE — Assessment & Plan Note (Signed)
I will check an echocardiogram when she comes back for followup. She will otherwise remain on the meds as listed.

## 2011-03-06 NOTE — Assessment & Plan Note (Signed)
After thoroughly reviewing the chart the decision is that she can remain off of Coumadin. She will otherwise remain on the meds as listed.  I think discontinuing the Coumadin is particularly important given her unstable gait and falls.

## 2011-03-06 NOTE — Assessment & Plan Note (Signed)
I will slowly go up on her enalapril which was decreased bring her hospitalization. She will increase to 7.5 twice a day.

## 2011-03-14 ENCOUNTER — Encounter: Payer: Self-pay | Admitting: Pulmonary Disease

## 2011-03-14 ENCOUNTER — Ambulatory Visit (INDEPENDENT_AMBULATORY_CARE_PROVIDER_SITE_OTHER): Payer: Medicare Other | Admitting: Pulmonary Disease

## 2011-03-14 VITALS — BP 158/75 | HR 64 | Temp 98.2°F | Ht 62.0 in | Wt 273.2 lb

## 2011-03-14 DIAGNOSIS — G479 Sleep disorder, unspecified: Secondary | ICD-10-CM

## 2011-03-14 DIAGNOSIS — G4751 Confusional arousals: Secondary | ICD-10-CM

## 2011-03-14 NOTE — Patient Instructions (Signed)
Will arrange for sleep study Will arrange for follow up after sleep study reviewed 

## 2011-03-14 NOTE — Assessment & Plan Note (Signed)
She has snoring, sleep disruption, and daytime sleepiness.  She has a history of coronary disease, congestive heart failure, CVA, diabetes, and depression.  I am concerned that she could have either obstructive and/or central sleep apnea.  I have explained how sleep apnea can affect the patient's health.  Driving precautions and importance of weight loss were discussed.  Treatment options for sleep apnea were reviewed.  To further assess will arrange for in lab sleep study.

## 2011-03-14 NOTE — Progress Notes (Signed)
  Subjective:    Patient ID: [name], female    DOB: Oct 04, 1950, 60 y.o.   MRN: [id number]  HPI    Review of Systems  HENT: Positive for congestion.   Respiratory: Positive for shortness of breath.   Musculoskeletal: Positive for joint swelling.       Objective:   Physical Exam        Assessment & Plan:

## 2011-03-14 NOTE — Progress Notes (Signed)
Chief Complaint  Patient presents with  . sleep consult    Pt c/o trouble getting and staying asleepy, wakes up about 3 times a night    History of Present Illness: [name] is a 60 y.o. female for evaluation of sleep difficulties.  She had an episode recently during which she fell out of bed, and hit her head.  As a result there was concern she had a sleep problem, and she was referred for further evaluation.  She was not aware of what happened until after she fell out of bed.  She has noticed trouble with her sleep for years.  She has trouble falling asleep and staying asleep.  She has tried using tylenol pm, but this didn't help.  She is a restless sleeper.  She does snore, and has been told she stops breathing while asleep.  She gets a dry mouth, and tends to drool at night.  She also has trouble with back pain while asleep.  She tends to sleep on her side.  She will nap for about an hour twice per week, and this helps some.  She denies headaches.  She does not use anything to help her sleep.  She goes to bed at 11 pm.  She can take up to an hour to fall asleep.  She wakes up frequently during the night.  She gets out of bed at 8 am.  The patient denies sleep walking, sleep talking, bruxism, or nightmares.  There is no history of restless legs.  The patient denies sleep hallucinations, sleep paralysis, or cataplexy.  His Epworth score is 7 out of 24.  Past Medical History  Diagnosis Date  . CHF (congestive heart failure)     EF 25%  . Cholelithiasis   . Diabetic retinopathy   . Diabetic nephropathy   . Hyperlipemia   . Diabetes mellitus   . Depression   . CAD (coronary artery disease)   . CVA (cerebral vascular accident)     Past Surgical History  Procedure Date  . Tonsillectomy   . Adenoidectomy   . Coronary artery bypass graft     X 5  . C-sections     x2  . Cholecystectomy   . Wisdom tooth extraction     x3  . Tracheostomy     Current Outpatient  Prescriptions on File Prior to Visit  Medication Sig Dispense Refill  . ampicillin (PRINCIPEN) 500 MG capsule Take 500 mg by mouth daily.        . Ascorbic Acid (VITAMIN C) 500 MG tablet 2 tablets by mouth once daily      . aspirin 81 MG tablet Take 81 mg by mouth daily.        . bisoprolol-hydrochlorothiazide (ZIAC) 10-6.25 MG per tablet TAKE ONE TABLET BY MOUTH ONE TIME DAILY  90 tablet  1  . Calcium-Phosphorus-Vitamin D (CALCIUM GUMMIES PO) Take 1 tablet by mouth daily.        . Cholecalciferol (VITAMIN D3) 2000 UNITS TABS Take 1 tablet by mouth daily.        . Emollient (CERAVE) CREA Apply topically as directed.        Marland Kitchen FIBER SELECT GUMMIES PO Take 1 tablet by mouth daily.        . furosemide (LASIX) 20 MG tablet Take 1 tablet (20 mg total) by mouth every other day.  90 tablet  2  . hydrALAZINE (APRESOLINE) 25 MG tablet TAKE ONE TABLET BY MOUTH TWICE DAILY  60  tablet  5  . insulin NPH-insulin regular (NOVOLIN 70/30) (70-30) 100 UNIT/ML injection Inject subcutaneously 25 units with breakfast and 20 units with evening meal       . metroNIDAZOLE (METROGEL) 0.75 % gel Apply topically as directed.        . Multiple Vitamin (MULTIVITAMIN) tablet Take 1 tablet by mouth daily.        . simvastatin (ZOCOR) 40 MG tablet TAKE ONE TABLET BY MOUTH ONE TIME DAILY  30 tablet  5    Allergies  Allergen Reactions  . Okra     family history includes Diabetes in her mother; Heart disease in her father and mother; and Hypertension in her mother.   reports that she quit smoking about 42 years ago. Her smoking use included Cigarettes. She quit after 1 year of use. She does not have any smokeless tobacco history on file. She reports that she does not drink alcohol or use illicit drugs.  Blood pressure 158/75, pulse 64, temperature 98.2 F (36.8 C), temperature source Oral, height 5\' 2"  (1.575 m), weight 273 lb 3.2 oz (123.923 kg), SpO2 93.00%.  Physical Exam:  General - Obese HEENT - PERRLA, EOMI,  wears glasses, no sinus tenderness, MP 4, scalloped tongue, no LAN, no thyromegaly, healed trach scar Cardiac - s1s2 regular, no murmur Chest - CTA Abdomen - soft, nontender Extremities - no e/c/c Neurologic - follows commands appropriately Skin - no rashes Psychiatric - normal mood, behavior  Assessment/Plan:  Sleep disorder She has snoring, sleep disruption, and daytime sleepiness.  She has a history of coronary disease, congestive heart failure, CVA, diabetes, and depression.  I am concerned that she could have either obstructive and/or central sleep apnea.  I have explained how sleep apnea can affect the patient's health.  Driving precautions and importance of weight loss were discussed.  Treatment options for sleep apnea were reviewed.  To further assess will arrange for in lab sleep study.   Confusional arousals From her description of her recent fall she may have experienced a confusional arousal possibly related to sleep apnea.  Will further assess after review of her sleep study.     Outpatient Encounter Prescriptions as of 03/14/2011  Medication Sig Dispense Refill  . ampicillin (PRINCIPEN) 500 MG capsule Take 500 mg by mouth daily.        . Ascorbic Acid (VITAMIN C) 500 MG tablet 2 tablets by mouth once daily      . aspirin 81 MG tablet Take 81 mg by mouth daily.        . bisoprolol-hydrochlorothiazide (ZIAC) 10-6.25 MG per tablet TAKE ONE TABLET BY MOUTH ONE TIME DAILY  90 tablet  1  . Calcium-Phosphorus-Vitamin D (CALCIUM GUMMIES PO) Take 1 tablet by mouth daily.        . Cholecalciferol (VITAMIN D3) 2000 UNITS TABS Take 1 tablet by mouth daily.        . Emollient (CERAVE) CREA Apply topically as directed.        . enalapril (VASOTEC) 2.5 MG tablet 7.5 mg a day       . FIBER SELECT GUMMIES PO Take 1 tablet by mouth daily.        . furosemide (LASIX) 20 MG tablet Take 1 tablet (20 mg total) by mouth every other day.  90 tablet  2  . hydrALAZINE (APRESOLINE) 25 MG  tablet TAKE ONE TABLET BY MOUTH TWICE DAILY  60 tablet  5  . insulin NPH-insulin regular (NOVOLIN 70/30) (70-30) 100 UNIT/ML  injection Inject subcutaneously 25 units with breakfast and 20 units with evening meal       . metroNIDAZOLE (METROGEL) 0.75 % gel Apply topically as directed.        . Multiple Vitamin (MULTIVITAMIN) tablet Take 1 tablet by mouth daily.        . simvastatin (ZOCOR) 40 MG tablet TAKE ONE TABLET BY MOUTH ONE TIME DAILY  30 tablet  5  . DISCONTD: enalapril (VASOTEC) 2.5 MG tablet Take 1 tablet (2.5 mg total) by mouth 2 (two) times daily.  60 tablet  11  . DISCONTD: enalapril (VASOTEC) 10 MG tablet Take by mouth. 1/2 TABLET TWO TIMES DAILY       . DISCONTD: vitamin E 400 UNIT capsule Take 400 Units by mouth daily.          Anothy Bufano Pager:  [phone] 03/14/2011, 2:29 PM

## 2011-03-14 NOTE — Assessment & Plan Note (Signed)
From her description of her recent fall she may have experienced a confusional arousal possibly related to sleep apnea.  Will further assess after review of her sleep study.

## 2011-03-20 ENCOUNTER — Ambulatory Visit (HOSPITAL_BASED_OUTPATIENT_CLINIC_OR_DEPARTMENT_OTHER): Payer: Medicare Other | Attending: Pulmonary Disease | Admitting: General Practice

## 2011-03-20 VITALS — Ht 62.0 in | Wt 270.0 lb

## 2011-03-20 DIAGNOSIS — Z794 Long term (current) use of insulin: Secondary | ICD-10-CM | POA: Insufficient documentation

## 2011-03-20 DIAGNOSIS — I509 Heart failure, unspecified: Secondary | ICD-10-CM | POA: Insufficient documentation

## 2011-03-20 DIAGNOSIS — G4733 Obstructive sleep apnea (adult) (pediatric): Secondary | ICD-10-CM | POA: Insufficient documentation

## 2011-03-20 DIAGNOSIS — Z79899 Other long term (current) drug therapy: Secondary | ICD-10-CM | POA: Insufficient documentation

## 2011-03-20 DIAGNOSIS — I2510 Atherosclerotic heart disease of native coronary artery without angina pectoris: Secondary | ICD-10-CM | POA: Insufficient documentation

## 2011-03-20 DIAGNOSIS — E119 Type 2 diabetes mellitus without complications: Secondary | ICD-10-CM | POA: Insufficient documentation

## 2011-03-20 DIAGNOSIS — G479 Sleep disorder, unspecified: Secondary | ICD-10-CM

## 2011-03-20 DIAGNOSIS — Z8673 Personal history of transient ischemic attack (TIA), and cerebral infarction without residual deficits: Secondary | ICD-10-CM | POA: Insufficient documentation

## 2011-03-28 ENCOUNTER — Telehealth: Payer: Self-pay | Admitting: Pulmonary Disease

## 2011-03-28 ENCOUNTER — Encounter: Payer: Self-pay | Admitting: Pulmonary Disease

## 2011-03-28 DIAGNOSIS — G4733 Obstructive sleep apnea (adult) (pediatric): Secondary | ICD-10-CM

## 2011-03-28 NOTE — Telephone Encounter (Signed)
PSG 03/20/11>>AHI 74.1, SpO2 low 77%, PLMI 0.  Left message with pt explaining that she has severe sleep apnea.  Advised that she will need in lab CPAP titration study.  Advised her to call back to discuss further, or can have CPAP titration scheduled if she does not have any questions.

## 2011-03-28 NOTE — Telephone Encounter (Signed)
lmomtcb  

## 2011-03-29 NOTE — Telephone Encounter (Signed)
[email or web address] x 2  

## 2011-03-29 NOTE — Procedures (Signed)
NAME:  [name], [name]       ACCOUNT NO.:  [id number]  MEDICAL RECORD NO.:  [id number]          PATIENT TYPE:  OUT  LOCATION:  SLEEP CENTER                 FACILITY:  [facility]  PHYSICIAN:  Coralyn Helling, MD        DATE OF BIRTH:  1950-08-08  DATE OF STUDY:  03/20/2011                           NOCTURNAL POLYSOMNOGRAM  REFERRING PHYSICIAN:  Coralyn Helling, MD  REFERRING PHYSICIAN:  Coralyn Helling, MD.  INDICATION FOR STUDY:  [name] is a 60 year old female, who has a history of coronary artery disease, stroke, congestive heart failure, diabetes, and depression.  She also has symptoms of sleep disruption, snoring, and daytime sleepiness.  She also reports intermittent episodes of confusional arousals.  She is referred to the sleep lab for evaluation of hypersomnia with obstructive sleep apnea.  Height is 5 feet 2 inches.  Weight is 270 pounds.  BMI is 49.  Neck size is 15.5 inches.  EPWORTH SLEEPINESS SCORE:  9.  MEDICATIONS:  Ampicillin, vitamin C, aspirin, Ziac, vitamin D, Lasix, hydralazine, insulin.  SLEEP ARCHITECTURE:  Total recording time was 410 minutes.  Total sleep time was 123 minutes.  Sleep efficiency was 30% sleep latency was 92 minutes. The study was notable for the lack of REM sleep.  The patient slept in both the supine and non-supine positions.  Of note, is that the patient had difficulty with sleep initiation and sleep maintenance due to respiratory events.  RESPIRATORY DATA:  The average respiratory rate was 18.  Loud snoring was noted by the technician.  The overall apnea-hypopnea index was 74.1. There were 13 central apneic events.  The remainder of the events were obstructive in nature.  OXYGEN DATA:  The baseline oxygenation was 94%.  The oxygen saturation nadir was 77%.  The patient spent a total of 35 minutes with an oxygen saturation below 88%.  The study was conducted without the use of supplemental oxygen.  CARDIAC DATA:  The average  heart rate was 59 and the rhythm strip showed sinus rhythm with occasional PVCs.  MOVEMENT-PARASOMNIA:  The periodic limb movement index was zero and the patient had no restroom trips.  IMPRESSIONS-RECOMMENDATIONS:  This study shows evidence for severe obstructive sleep apnea with an overall apnea-hypopnea index of 74.1 and oxygen saturation nadir of 77%.  In addition to diet, exercise, and weight reduction, I would recommend that the patient return to the sleep lab for a CPAP titration study.     Coralyn Helling, MD Diplomat, American Board of Sleep Medicine    VS/MEDQ  D:  03/28/2011 11:21:07  T:  03/29/2011 16:10:96  Job:  [id number]

## 2011-04-03 NOTE — Telephone Encounter (Signed)
lmomtcb  

## 2011-04-06 ENCOUNTER — Other Ambulatory Visit: Payer: Self-pay | Admitting: *Deleted

## 2011-04-06 MED ORDER — ENALAPRIL MALEATE 5 MG PO TABS: 5.0000 mg | ORAL_TABLET | Freq: Every day | ORAL | Status: DC

## 2011-04-09 NOTE — Telephone Encounter (Signed)
lmomtcb  

## 2011-04-18 ENCOUNTER — Encounter: Payer: Self-pay | Admitting: *Deleted

## 2011-04-18 NOTE — Telephone Encounter (Signed)
[email or web address] will send letter to pt

## 2011-04-24 DIAGNOSIS — B079 Viral wart, unspecified: Secondary | ICD-10-CM | POA: Diagnosis not present

## 2011-04-24 DIAGNOSIS — L719 Rosacea, unspecified: Secondary | ICD-10-CM | POA: Diagnosis not present

## 2011-04-30 ENCOUNTER — Other Ambulatory Visit (HOSPITAL_COMMUNITY): Payer: Medicare Other | Admitting: Radiology

## 2011-05-02 ENCOUNTER — Ambulatory Visit (INDEPENDENT_AMBULATORY_CARE_PROVIDER_SITE_OTHER): Payer: Medicare Other | Admitting: Endocrinology

## 2011-05-02 ENCOUNTER — Encounter: Payer: Self-pay | Admitting: Endocrinology

## 2011-05-02 ENCOUNTER — Other Ambulatory Visit (INDEPENDENT_AMBULATORY_CARE_PROVIDER_SITE_OTHER): Payer: Medicare Other

## 2011-05-02 DIAGNOSIS — I509 Heart failure, unspecified: Secondary | ICD-10-CM

## 2011-05-02 DIAGNOSIS — E119 Type 2 diabetes mellitus without complications: Secondary | ICD-10-CM | POA: Diagnosis not present

## 2011-05-02 DIAGNOSIS — D509 Iron deficiency anemia, unspecified: Secondary | ICD-10-CM | POA: Diagnosis not present

## 2011-05-02 DIAGNOSIS — Z79899 Other long term (current) drug therapy: Secondary | ICD-10-CM

## 2011-05-02 DIAGNOSIS — E785 Hyperlipidemia, unspecified: Secondary | ICD-10-CM

## 2011-05-02 DIAGNOSIS — N289 Disorder of kidney and ureter, unspecified: Secondary | ICD-10-CM

## 2011-05-02 DIAGNOSIS — Z0000 Encounter for general adult medical examination without abnormal findings: Secondary | ICD-10-CM

## 2011-05-02 DIAGNOSIS — Z23 Encounter for immunization: Secondary | ICD-10-CM

## 2011-05-02 DIAGNOSIS — E876 Hypokalemia: Secondary | ICD-10-CM | POA: Diagnosis not present

## 2011-05-02 LAB — BASIC METABOLIC PANEL WITH GFR
BUN: 44 mg/dL — ABNORMAL HIGH (ref 6–23)
CO2: 24 meq/L (ref 19–32)
Calcium: 9.6 mg/dL (ref 8.4–10.5)
Chloride: 106 meq/L (ref 96–112)
Creatinine, Ser: 1.6 mg/dL — ABNORMAL HIGH (ref 0.4–1.2)
GFR: 35.26 mL/min — ABNORMAL LOW
Glucose, Bld: 205 mg/dL — ABNORMAL HIGH (ref 70–99)
Potassium: 4.6 meq/L (ref 3.5–5.1)
Sodium: 140 meq/L (ref 135–145)

## 2011-05-02 LAB — CBC WITH DIFFERENTIAL/PLATELET
Eosinophils Relative: 6.2 % — ABNORMAL HIGH (ref 0.0–5.0)
HCT: 41.3 % (ref 36.0–46.0)
Hemoglobin: 14.1 g/dL (ref 12.0–15.0)
Lymphocytes Relative: 21 % (ref 12.0–46.0)
Lymphs Abs: 1.5 10*3/uL (ref 0.7–4.0)
Monocytes Relative: 9.8 % (ref 3.0–12.0)
Neutro Abs: 4.4 10*3/uL (ref 1.4–7.7)
RBC: 4.42 Mil/uL (ref 3.87–5.11)
WBC: 7.1 10*3/uL (ref 4.5–10.5)

## 2011-05-02 LAB — LIPID PANEL
HDL: 36.4 mg/dL — ABNORMAL LOW (ref >39.00)
Triglycerides: 161 mg/dL — ABNORMAL HIGH (ref 0.0–149.0)
VLDL: 32.2 mg/dL (ref 0.0–40.0)

## 2011-05-02 LAB — HEPATIC FUNCTION PANEL
Albumin: 3.9 g/dL (ref 3.5–5.2)
Alkaline Phosphatase: 41 U/L (ref 39–117)
Bilirubin, Direct: 0.1 mg/dL (ref 0.0–0.3)

## 2011-05-02 LAB — URINALYSIS, ROUTINE W REFLEX MICROSCOPIC
Bilirubin Urine: NEGATIVE
Hgb urine dipstick: NEGATIVE
Ketones, ur: NEGATIVE
Total Protein, Urine: 100
Urine Glucose: NEGATIVE

## 2011-05-02 LAB — IBC PANEL: Saturation Ratios: 24.2 % (ref 20.0–50.0)

## 2011-05-02 LAB — HEMOGLOBIN A1C: Hgb A1c MFr Bld: 7.7 % — ABNORMAL HIGH (ref 4.6–6.5)

## 2011-05-02 LAB — MICROALBUMIN / CREATININE URINE RATIO: Microalb Creat Ratio: 48 mg/g — ABNORMAL HIGH (ref 0.0–30.0)

## 2011-05-02 LAB — TSH: TSH: 3.99 u[IU]/mL (ref 0.35–5.50)

## 2011-05-02 MED ORDER — OXYBUTYNIN CHLORIDE ER 10 MG PO TB24: 10.0000 mg | ORAL_TABLET | Freq: Every day | ORAL | Status: DC

## 2011-05-02 NOTE — Patient Instructions (Addendum)
blood tests are being requested for you today.  please call [phone] to hear your test results.  You will be prompted to enter the 9-digit "MRN" number that appears at the top left of this page, followed by #.  Then you will hear the message. please consider these measures for your health:  minimize alcohol.  do not use tobacco products.  have a colonoscopy at least every 10 years from age 62.  Women should have an annual mammogram from age 62.  keep firearms safely stored.  always use seat belts.  have working smoke alarms in your home.  see an eye doctor and dentist regularly.  never drive under the influence of alcohol or drugs (including prescription drugs).  those with fair skin should take precautions against the sun. please let me know what your wishes would be, if artificial life support measures should become necessary.  it is critically important to prevent falling down (keep floor areas well-lit, dry, and free of loose objects.  If you have a cane, walker, or wheelchair, you should use it, even for short trips around the house.  Also, try not to rush) Please come back for a follow-up appointment in 3 months Please call if you decide to do the colonoscopy.  It reduces your chances of dying of colon cancer. [phone], to get an appointment for a mammogram. You should have a vaccine against shingles (a painful rash which results from the  chickenpox infection which most people had many years ago).  This vaccine reduces, but does not totally eliminate the risk of shingles.  Because this is a medicare part d benefit, you should get it at a pharmacy.   i have sent a prescription to your pharmacy, to take on a trial basis, for the urinary leakage. good diet and exercise habits significanly improve the control of your diabetes.  please let me know if you wish to be referred to a dietician.  high blood sugar is very risky to your health.  you should see an eye doctor every  year. controlling your blood pressure and cholesterol drastically reduces the damage diabetes does to your body.  this also applies to quitting smoking.  please discuss these with your doctor.  you should take an aspirin every day, unless you have been advised by a doctor not to. check your blood sugar2 times a day.  vary the time of day when you check, between before the 3 meals, and at bedtime.  also check if you have symptoms of your blood sugar being too high or too low.  please keep a record of the readings and bring it to your next appointment here.  please call us sooner if your blood sugar goes below 70, or if it stays over 200. (update: we discussed code status.  pt requests full code, but would not want to be started or maintained on artificial life-support measures if there was not a reasonable chance of recovery).

## 2011-05-02 NOTE — Progress Notes (Signed)
Subjective:    Patient ID: [name], female    DOB: 07/26/1950, 61 y.o.   MRN: [id number]  HPI Pt returns for f/u of insulin-requiring DM (2001).  no cbg record, but states cbg's are "high" in the morning.  She does not check later in the day.   Pt states 1 year of moderate urinary incontinence, worst in the context of sleeping.  No assoc dysuria. Past Medical History  Diagnosis Date  . CHF (congestive heart failure)     EF 25%  . Cholelithiasis   . Diabetic retinopathy   . Diabetic nephropathy   . Hyperlipemia   . Diabetes mellitus   . Depression   . CAD (coronary artery disease)   . CVA (cerebral vascular accident)   . OSA (obstructive sleep apnea) 01/17/2011    Past Surgical History  Procedure Date  . Tonsillectomy   . Adenoidectomy   . Coronary artery bypass graft     X 5  . C-sections     x2  . Cholecystectomy   . Wisdom tooth extraction     x3  . Tracheostomy     History   Social History  . Marital Status: Divorced    Spouse Name: N/A    Number of Children: 1  . Years of Education: N/A   Occupational History  . Teacher     retired   Social History Main Topics  . Smoking status: Former Smoker -- 1 years    Types: Cigarettes    Quit date: 09/13/1968  . Smokeless tobacco: Not on file   Comment: occasional  . Alcohol Use: No  . Drug Use: No  . Sexually Active: Not on file   Other Topics Concern  . Not on file   Social History Narrative  . No narrative on file    Current Outpatient Prescriptions on File Prior to Visit  Medication Sig Dispense Refill  . ampicillin (PRINCIPEN) 500 MG capsule Take 500 mg by mouth every other day.       . Ascorbic Acid (VITAMIN C) 500 MG tablet 2 tablets by mouth once daily      . aspirin 81 MG tablet Take 81 mg by mouth daily.        . bisoprolol-hydrochlorothiazide (ZIAC) 10-6.25 MG per tablet TAKE ONE TABLET BY MOUTH ONE TIME DAILY  90 tablet  1  . Calcium-Phosphorus-Vitamin D (CALCIUM GUMMIES PO)  Take 1 tablet by mouth daily.        . Cholecalciferol (VITAMIN D3) 2000 UNITS TABS Take 1 tablet by mouth daily.        . Emollient (CERAVE) CREA Apply topically as directed.        Marland Kitchen FIBER SELECT GUMMIES PO Take 1 tablet by mouth daily.        . furosemide (LASIX) 20 MG tablet Take 1 tablet (20 mg total) by mouth every other day.  90 tablet  2  . hydrALAZINE (APRESOLINE) 25 MG tablet TAKE ONE TABLET BY MOUTH TWICE DAILY  60 tablet  5  . insulin NPH-insulin regular (NOVOLIN 70/30) (70-30) 100 UNIT/ML injection Inject subcutaneously 30 units with breakfast and 20 units with evening meal      . metroNIDAZOLE (METROGEL) 0.75 % gel Apply topically as directed.        . Multiple Vitamin (MULTIVITAMIN) tablet Take 1 tablet by mouth daily.          Allergies  Allergen Reactions  . Okra     Family History  Problem Relation Age of Onset  . Hypertension Mother   . Diabetes Mother   . Heart disease Mother   . Heart disease Father     Cardiovascular accident    BP 134/62  Pulse 66  Temp(Src) 97.9 F (36.6 C) (Oral)  Ht 5\' 2"  (1.575 m)  Wt 270 lb 6.4 oz (122.653 kg)  BMI 49.46 kg/m2  SpO2 93%    Review of Systems  Respiratory: Negative for shortness of breath.   Cardiovascular: Negative for chest pain.  Gastrointestinal: Negative for anal bleeding.  Genitourinary: Negative for hematuria.  Neurological: Negative for syncope.   denies hypoglycemia.    Objective:   Physical Exam VITAL SIGNS:  See vs page GENERAL: no distress.  Morbid obesity Pulses: dorsalis pedis intact bilat.   Feet: no deformity.  no ulcer on the feet.  feet are of normal color and temp.  no edema,.  There is a healed vein harvest scar at the right leg Neuro: sensation is intact to touch on the feet  Lab Results  Component Value Date   WBC 7.1 05/02/2011   HGB 14.1 05/02/2011   HCT 41.3 05/02/2011   PLT 196.0 05/02/2011   GLUCOSE 205* 05/02/2011   CHOL 145 05/02/2011   TRIG 161.0* 05/02/2011   HDL 36.40*  05/02/2011   LDLDIRECT 100.9 02/03/2008   LDLCALC 76 05/02/2011   ALT 20 05/02/2011   AST 23 05/02/2011   NA 140 05/02/2011   K 4.6 05/02/2011   CL 106 05/02/2011   CREATININE 1.6* 05/02/2011   BUN 44* 05/02/2011   CO2 24 05/02/2011   TSH 3.99 05/02/2011   INR 1.62* 01/19/2011   HGBA1C 7.7* 05/02/2011   MICROALBUR 53.3* 05/02/2011      Assessment & Plan:  Urinary incontinence, uncertain etiology DM:  this is the best control this pt should aim for, given this regimen, which does match insulin to her changing needs throughout the day Microalbuminuria, persistent--the rx of this is risk-factor control.    Subjective:   Patient here for Medicare annual wellness visit and management of other chronic and acute problems.     Risk factors: multiple med probs   Copy Providing Medical Care to Patient: Cardiol: hochrein Opthal: pt does not remember   Activities of Daily Living: In your present state of health, do you have any difficulty performing the following activities?:  Preparing food and eating?: No  Bathing yourself: No  Getting dressed: No  Using the toilet:No  Moving around from place to place: No  In the past year have you fallen or had a near fall?:  No    Home Safety: Has smoke detector and wears seat belts. No firearms. No excess sun exposure.  Diet and Exercise  Current exercise habits:  Pt says poor Dietary issues discussed: pt says not good   Depression Screen  Q1: Over the past two weeks, have you felt down, depressed or hopeless? no  Q2: Over the past two weeks, have you felt little interest or pleasure in doing things? no   The following portions of the patient's history were reviewed and updated as appropriate: allergies, current medications, past family history, past medical history, past social history, past surgical history and problem list.  Past Medical History  Diagnosis Date  . CHF (congestive heart failure)     EF 25%  . Cholelithiasis   .  Diabetic retinopathy   . Diabetic nephropathy   . Hyperlipemia   . Diabetes mellitus   .  Depression   . CAD (coronary artery disease)   . CVA (cerebral vascular accident)   . OSA (obstructive sleep apnea) 01/17/2011    Past Surgical History  Procedure Date  . Tonsillectomy   . Adenoidectomy   . Coronary artery bypass graft     X 5  . C-sections     x2  . Cholecystectomy   . Wisdom tooth extraction     x3  . Tracheostomy     History   Social History  . Marital Status: Divorced    Spouse Name: N/A    Number of Children: 1  . Years of Education: N/A   Occupational History  . Teacher     retired   Social History Main Topics  . Smoking status: Former Smoker -- 1 years    Types: Cigarettes    Quit date: 09/13/1968  . Smokeless tobacco: Not on file   Comment: occasional  . Alcohol Use: No  . Drug Use: No  . Sexually Active: Not on file   Other Topics Concern  . Not on file   Social History Narrative  . No narrative on file    Current Outpatient Prescriptions on File Prior to Visit  Medication Sig Dispense Refill  . ampicillin (PRINCIPEN) 500 MG capsule Take 500 mg by mouth every other day.       . Ascorbic Acid (VITAMIN C) 500 MG tablet 2 tablets by mouth once daily      . aspirin 81 MG tablet Take 81 mg by mouth daily.        . bisoprolol-hydrochlorothiazide (ZIAC) 10-6.25 MG per tablet TAKE ONE TABLET BY MOUTH ONE TIME DAILY  90 tablet  1  . Calcium-Phosphorus-Vitamin D (CALCIUM GUMMIES PO) Take 1 tablet by mouth daily.        . Cholecalciferol (VITAMIN D3) 2000 UNITS TABS Take 1 tablet by mouth daily.        . Emollient (CERAVE) CREA Apply topically as directed.        Marland Kitchen FIBER SELECT GUMMIES PO Take 1 tablet by mouth daily.        . furosemide (LASIX) 20 MG tablet Take 1 tablet (20 mg total) by mouth every other day.  90 tablet  2  . hydrALAZINE (APRESOLINE) 25 MG tablet TAKE ONE TABLET BY MOUTH TWICE DAILY  60 tablet  5  . insulin NPH-insulin regular  (NOVOLIN 70/30) (70-30) 100 UNIT/ML injection Inject subcutaneously 30 units with breakfast and 20 units with evening meal      . metroNIDAZOLE (METROGEL) 0.75 % gel Apply topically as directed.        . Multiple Vitamin (MULTIVITAMIN) tablet Take 1 tablet by mouth daily.        . simvastatin (ZOCOR) 40 MG tablet TAKE ONE TABLET BY MOUTH ONE TIME DAILY  30 tablet  5    Allergies  Allergen Reactions  . Okra     Family History  Problem Relation Age of Onset  . Hypertension Mother   . Diabetes Mother   . Heart disease Mother   . Heart disease Father     Cardiovascular accident    BP 134/62  Pulse 66  Temp(Src) 97.9 F (36.6 C) (Oral)  Ht 5\' 2"  (1.575 m)  Wt 270 lb 6.4 oz (122.653 kg)  BMI 49.46 kg/m2  SpO2 93%   Review of Systems  Denies hearing loss, and visual loss Objective:   Vision:  Sees opthalmologist Hearing: grossly normal Body mass index:  See  vs page Msk: pt easily and quickly performs "get-up-and-go" from a sitting position Cognitive Impairment Assessment: cognition, memory and judgment appear normal.  remembers 3/3 at 5 minutes.  excellent recall.  can easily read and write a sentence.  alert and oriented x 3   Assessment:   Medicare wellness utd on preventive parameters    Plan:   During the course of the visit the patient was educated and counseled about appropriate screening and preventive services including:        Fall prevention   Screening mammography  Bone densitometry screening  Diabetes screening  Nutrition counseling     Patient Instructions (the written plan) was given to the patient.

## 2011-05-03 LAB — PTH, INTACT AND CALCIUM
Calcium, Total (PTH): 10.1 mg/dL (ref 8.4–10.5)
PTH: 16.7 pg/mL (ref 14.0–72.0)

## 2011-05-04 ENCOUNTER — Other Ambulatory Visit: Payer: Self-pay | Admitting: Endocrinology

## 2011-05-08 ENCOUNTER — Ambulatory Visit: Payer: Medicare Other | Admitting: Cardiology

## 2011-05-18 ENCOUNTER — Ambulatory Visit (HOSPITAL_COMMUNITY): Payer: Medicare Other | Attending: Internal Medicine | Admitting: Radiology

## 2011-05-18 DIAGNOSIS — Z8673 Personal history of transient ischemic attack (TIA), and cerebral infarction without residual deficits: Secondary | ICD-10-CM | POA: Insufficient documentation

## 2011-05-18 DIAGNOSIS — I2510 Atherosclerotic heart disease of native coronary artery without angina pectoris: Secondary | ICD-10-CM | POA: Insufficient documentation

## 2011-05-18 DIAGNOSIS — E785 Hyperlipidemia, unspecified: Secondary | ICD-10-CM | POA: Insufficient documentation

## 2011-05-18 DIAGNOSIS — N289 Disorder of kidney and ureter, unspecified: Secondary | ICD-10-CM | POA: Insufficient documentation

## 2011-05-18 DIAGNOSIS — I509 Heart failure, unspecified: Secondary | ICD-10-CM | POA: Diagnosis not present

## 2011-05-18 DIAGNOSIS — E119 Type 2 diabetes mellitus without complications: Secondary | ICD-10-CM | POA: Diagnosis not present

## 2011-05-22 DIAGNOSIS — Z23 Encounter for immunization: Secondary | ICD-10-CM | POA: Diagnosis not present

## 2011-05-24 ENCOUNTER — Encounter: Payer: Self-pay | Admitting: Cardiology

## 2011-05-24 ENCOUNTER — Ambulatory Visit (INDEPENDENT_AMBULATORY_CARE_PROVIDER_SITE_OTHER): Payer: Medicare Other | Admitting: Cardiology

## 2011-05-24 DIAGNOSIS — I428 Other cardiomyopathies: Secondary | ICD-10-CM

## 2011-05-24 DIAGNOSIS — N289 Disorder of kidney and ureter, unspecified: Secondary | ICD-10-CM

## 2011-05-24 MED ORDER — ENALAPRIL MALEATE 10 MG PO TABS: 10.0000 mg | ORAL_TABLET | Freq: Two times a day (BID) | ORAL | Status: DC

## 2011-05-24 NOTE — Patient Instructions (Signed)
Please increase your Enalapril to 10 mg twice a day Continue all other medications as listed  Please have blood work in 2 week (BMP)  Follow up with Dr Antoine Poche in 2 month

## 2011-05-24 NOTE — Assessment & Plan Note (Signed)
This is being managed in the context of treating his CHF  

## 2011-05-24 NOTE — Assessment & Plan Note (Signed)
Today I will increase the lisinopril to 10 mg twice a day. I will slowly titrate meds over time. I'm pleased to see that her ejection fraction are is stable if not slightly better.

## 2011-05-24 NOTE — Progress Notes (Signed)
HPI:  The patient presents for followup of cardiomyopathy. At the last visit I very carefully reviewed the chart his weight the options of restarting her Coumadin or not please see the discussion previously. She remains off of this with her history of falls and high fall risk. I did order a followup echocardiogram to evaluate her cardiomyopathy.  The EF was 30% which is perhaps slightly better than previous. At the last visit I increased her lisinopril. She tolerated this without difficulty. She's had no presyncope or syncope. Her chronic dyspnea but this is unchanged.  Allergies  Allergen Reactions  . Okra     Current Outpatient Prescriptions  Medication Sig Dispense Refill  . ampicillin (PRINCIPEN) 500 MG capsule Take 500 mg by mouth every other day.       . Ascorbic Acid (VITAMIN C) 500 MG tablet 2 tablets by mouth once daily      . aspirin 81 MG tablet Take 81 mg by mouth daily.        . bisoprolol-hydrochlorothiazide (ZIAC) 10-6.25 MG per tablet TAKE ONE TABLET BY MOUTH ONE TIME DAILY  90 tablet  1  . Calcium-Phosphorus-Vitamin D (CALCIUM GUMMIES PO) Take 1 tablet by mouth daily.        . Cholecalciferol (VITAMIN D3) 2000 UNITS TABS Take 1 tablet by mouth daily.        . Emollient (CERAVE) CREA Apply topically as directed.        . enalapril (VASOTEC) 5 MG tablet 1 1/2 tablet twice daily      . FIBER SELECT GUMMIES PO Take 1 tablet by mouth daily.        . furosemide (LASIX) 20 MG tablet Take 1 tablet (20 mg total) by mouth every other day.  90 tablet  2  . hydrALAZINE (APRESOLINE) 25 MG tablet TAKE ONE TABLET BY MOUTH TWICE DAILY  60 tablet  5  . insulin NPH-insulin regular (NOVOLIN 70/30) (70-30) 100 UNIT/ML injection Inject subcutaneously 30 units with breakfast and 20 units with evening meal      . Multiple Vitamin (MULTIVITAMIN) tablet Take 1 tablet by mouth daily.        Marland Kitchen oxybutynin (DITROPAN-XL) 10 MG 24 hr tablet Take 1 tablet (10 mg total) by mouth daily.  30 tablet  11  .  simvastatin (ZOCOR) 40 MG tablet TAKE ONE TABLET BY MOUTH ONE TIME DAILY  30 tablet  5    Past Medical History  Diagnosis Date  . CHF (congestive heart failure)     EF 25%  . Cholelithiasis   . Diabetic retinopathy   . Diabetic nephropathy   . Hyperlipemia   . Diabetes mellitus   . Depression   . CAD (coronary artery disease)   . CVA (cerebral vascular accident)   . OSA (obstructive sleep apnea) 01/17/2011    Past Surgical History  Procedure Date  . Tonsillectomy   . Adenoidectomy   . Coronary artery bypass graft     X 5  . C-sections     x2  . Cholecystectomy   . Wisdom tooth extraction     x3  . Tracheostomy     ROS:  As stated in the HPI and negative for all other systems.  PHYSICAL EXAM BP 128/84  Pulse 65  Ht 5\' 2"  (1.575 m)  Wt 274 lb (124.286 kg)  BMI 50.12 kg/m2 GENERAL:  Well appearing NECK:  No jugular venous distention, waveform within normal limits, carotid upstroke brisk and symmetric, no bruits, no  thyromegaly LYMPHATICS:  No cervical, inguinal adenopathy LUNGS:  Clear to auscultation bilaterally BACK:  No CVA tenderness CHEST:  Unremarkable HEART:  PMI not displaced or sustained,S1 and S2 within normal limits, no S3, no S4, no clicks, no rubs, no murmurs ABD:  Flat, positive bowel sounds normal in frequency in pitch, no bruits, no rebound, no guarding, no midline pulsatile mass, no hepatomegaly, no splenomegaly EXT:  2 plus pulses throughout, no edema, no cyanosis no clubbing  EKG:  Normal sinus rhythm, left bundle branch block, left axis deviation, no change from previous. 05/24/2011   ASSESSMENT AND PLAN

## 2011-05-24 NOTE — Assessment & Plan Note (Signed)
I will check a basic metabolic profile in 2 weeks.

## 2011-05-30 ENCOUNTER — Ambulatory Visit: Payer: Self-pay | Admitting: Cardiology

## 2011-06-07 ENCOUNTER — Other Ambulatory Visit: Payer: Medicare Other

## 2011-06-18 ENCOUNTER — Other Ambulatory Visit: Payer: Self-pay | Admitting: Endocrinology

## 2011-07-22 ENCOUNTER — Other Ambulatory Visit: Payer: Self-pay | Admitting: Cardiology

## 2011-07-30 ENCOUNTER — Ambulatory Visit: Payer: Medicare Other | Admitting: Cardiology

## 2011-08-01 ENCOUNTER — Ambulatory Visit: Payer: Medicare Other | Admitting: Endocrinology

## 2011-08-02 ENCOUNTER — Ambulatory Visit: Payer: Medicare Other | Admitting: Cardiology

## 2011-08-13 ENCOUNTER — Ambulatory Visit (INDEPENDENT_AMBULATORY_CARE_PROVIDER_SITE_OTHER)
Admission: RE | Admit: 2011-08-13 | Discharge: 2011-08-13 | Disposition: A | Discharge status: Home / Self Care | Payer: Medicare Other | Source: Ambulatory Visit | Attending: Endocrinology | Admitting: Endocrinology

## 2011-08-13 ENCOUNTER — Ambulatory Visit (INDEPENDENT_AMBULATORY_CARE_PROVIDER_SITE_OTHER): Payer: Medicare Other | Admitting: Endocrinology

## 2011-08-13 ENCOUNTER — Other Ambulatory Visit (INDEPENDENT_AMBULATORY_CARE_PROVIDER_SITE_OTHER): Payer: Medicare Other

## 2011-08-13 ENCOUNTER — Encounter: Payer: Self-pay | Admitting: Endocrinology

## 2011-08-13 VITALS — BP 128/67 | HR 80 | Temp 98.2°F | Ht 62.0 in | Wt 270.0 lb

## 2011-08-13 DIAGNOSIS — M545 Low back pain: Secondary | ICD-10-CM

## 2011-08-13 DIAGNOSIS — E1065 Type 1 diabetes mellitus with hyperglycemia: Secondary | ICD-10-CM

## 2011-08-13 DIAGNOSIS — N058 Unspecified nephritic syndrome with other morphologic changes: Secondary | ICD-10-CM

## 2011-08-13 DIAGNOSIS — E1029 Type 1 diabetes mellitus with other diabetic kidney complication: Secondary | ICD-10-CM

## 2011-08-13 DIAGNOSIS — M4120 Other idiopathic scoliosis, site unspecified: Secondary | ICD-10-CM | POA: Diagnosis not present

## 2011-08-13 DIAGNOSIS — M5137 Other intervertebral disc degeneration, lumbosacral region: Secondary | ICD-10-CM | POA: Diagnosis not present

## 2011-08-13 LAB — HEMOGLOBIN A1C: Hgb A1c MFr Bld: 7.2 % — ABNORMAL HIGH (ref 4.6–6.5)

## 2011-08-13 MED ORDER — TRAMADOL-ACETAMINOPHEN 37.5-325 MG PO TABS: 1.0000 | ORAL_TABLET | Freq: Four times a day (QID) | ORAL | Status: DC | PRN

## 2011-08-13 NOTE — Progress Notes (Signed)
Subjective:    Patient ID: [name], female    DOB: 09-Mar-1951, 61 y.o.   MRN: [id number]  HPI Pt returns for f/u of insulin-requiring DM (dx'ed 2001, complicated by retinopathy, CAD, PAD, nephropathy, and peripheral sensory neuropathy).  she brings a record of her cbg's which i have reviewed today.  All are in the mid-100's.  There is no trend throughout the day.   Pt states few years of moderate pain at the lower back.  She has assoc numbness of the feet. Past Medical History  Diagnosis Date  . CHF (congestive heart failure)     EF 25%  . Cholelithiasis   . Diabetic retinopathy   . Diabetic nephropathy   . Hyperlipemia   . Diabetes mellitus   . Depression   . CAD (coronary artery disease)   . CVA (cerebral vascular accident)   . OSA (obstructive sleep apnea) 01/17/2011    Past Surgical History  Procedure Date  . Tonsillectomy   . Adenoidectomy   . Coronary artery bypass graft     X 5  . C-sections     x2  . Cholecystectomy   . Wisdom tooth extraction     x3  . Tracheostomy     History   Social History  . Marital Status: Divorced    Spouse Name: N/A    Number of Children: 1  . Years of Education: N/A   Occupational History  . Teacher     retired   Social History Main Topics  . Smoking status: Former Smoker -- 1 years    Types: Cigarettes    Quit date: 09/13/1968  . Smokeless tobacco: Not on file   Comment: occasional  . Alcohol Use: No  . Drug Use: No  . Sexually Active: Not on file   Other Topics Concern  . Not on file   Social History Narrative  . No narrative on file    Current Outpatient Prescriptions on File Prior to Visit  Medication Sig Dispense Refill  . ampicillin (PRINCIPEN) 500 MG capsule Take 500 mg by mouth every other day.       . Ascorbic Acid (VITAMIN C) 500 MG tablet 2 tablets by mouth once daily      . aspirin 81 MG tablet Take 81 mg by mouth daily.        . bisoprolol-hydrochlorothiazide (ZIAC) 10-6.25 MG per  tablet TAKE ONE TABLET BY MOUTH ONE TIME DAILY  90 tablet  1  . Calcium-Phosphorus-Vitamin D (CALCIUM GUMMIES PO) Take 1 tablet by mouth daily.        . Cholecalciferol (VITAMIN D3) 2000 UNITS TABS Take 1 tablet by mouth daily.        . Emollient (CERAVE) CREA Apply topically as directed.        . enalapril (VASOTEC) 10 MG tablet Take 1 tablet (10 mg total) by mouth 2 (two) times daily.  60 tablet  6  . FIBER SELECT GUMMIES PO Take 1 tablet by mouth daily.        . furosemide (LASIX) 20 MG tablet Take 1 tablet (20 mg total) by mouth every other day.  90 tablet  2  . hydrALAZINE (APRESOLINE) 25 MG tablet TAKE ONE TABLET BY MOUTH TWICE DAILY  60 tablet  4  . insulin NPH-insulin regular (NOVOLIN 70/30) (70-30) 100 UNIT/ML injection Inject subcutaneously 30 units with breakfast and 25 units with evening meal      . Multiple Vitamin (MULTIVITAMIN) tablet Take 1 tablet by  mouth daily.        Marland Kitchen oxybutynin (DITROPAN-XL) 10 MG 24 hr tablet Take 1 tablet (10 mg total) by mouth daily.  30 tablet  11  . simvastatin (ZOCOR) 40 MG tablet TAKE ONE TABLET BY MOUTH ONE TIME DAILY  30 tablet  5  . DISCONTD: enalapril (VASOTEC) 10 MG tablet Take 1 tablet (10 mg total) by mouth daily.  60 tablet  11    Allergies  Allergen Reactions  . Okra     Family History  Problem Relation Age of Onset  . Hypertension Mother   . Diabetes Mother   . Heart disease Mother   . Heart disease Father     Cardiovascular accident   BP 128/67  Pulse 80  Temp(Src) 98.2 F (36.8 C) (Oral)  Ht 5\' 2"  (1.575 m)  Wt 270 lb (122.471 kg)  BMI 49.38 kg/m2  SpO2 93%  Review of Systems denies hypoglycemia.  Urinary incont is better.    Objective:   Physical Exam VITAL SIGNS:  See vs page GENERAL: no distress SKIN:  Insulin injection sites at the anterior abdomen are normal. Spine: nontender Gait: normal and steady  Lab Results  Component Value Date   HGBA1C 7.2* 08/13/2011  (i reviewed x-ray results)    Assessment &  Plan:  DM.  this is the best control this pt should aim for, given this regimen, which does match insulin to her changing needs throughout the day Low-back pain, new.  Due to scoliosis and oa

## 2011-08-13 NOTE — Patient Instructions (Addendum)
blood tests and x-rays are being requested for you today.  You will receive a letter with results. Please come back for a follow-up appointment in 3 months check your blood sugar 2 times a day.  vary the time of day when you check, between before the 3 meals, and at bedtime.  also check if you have symptoms of your blood sugar being too high or too low.  please keep a record of the readings and bring it to your next appointment here.  please call us sooner if your blood sugar goes below 70, or if it stays over 200. i have sent a prescription to your pharmacy, for pain medication.

## 2011-08-14 ENCOUNTER — Telehealth: Payer: Self-pay | Admitting: *Deleted

## 2011-08-14 NOTE — Telephone Encounter (Signed)
Called pt to inform of lab results, pt informed (letter also mailed to pt). 

## 2011-10-10 ENCOUNTER — Other Ambulatory Visit: Payer: Self-pay | Admitting: Endocrinology

## 2011-10-20 ENCOUNTER — Other Ambulatory Visit: Payer: Self-pay | Admitting: Endocrinology

## 2011-11-08 ENCOUNTER — Other Ambulatory Visit: Payer: Self-pay | Admitting: Endocrinology

## 2011-11-12 ENCOUNTER — Other Ambulatory Visit (INDEPENDENT_AMBULATORY_CARE_PROVIDER_SITE_OTHER): Payer: Medicare Other

## 2011-11-12 ENCOUNTER — Ambulatory Visit (INDEPENDENT_AMBULATORY_CARE_PROVIDER_SITE_OTHER): Payer: Medicare Other | Admitting: Endocrinology

## 2011-11-12 ENCOUNTER — Encounter: Payer: Self-pay | Admitting: Endocrinology

## 2011-11-12 VITALS — BP 136/69 | HR 73 | Temp 97.8°F | Ht 62.0 in | Wt 260.4 lb

## 2011-11-12 DIAGNOSIS — N058 Unspecified nephritic syndrome with other morphologic changes: Secondary | ICD-10-CM | POA: Diagnosis not present

## 2011-11-12 DIAGNOSIS — E1029 Type 1 diabetes mellitus with other diabetic kidney complication: Secondary | ICD-10-CM | POA: Diagnosis not present

## 2011-11-12 DIAGNOSIS — E1065 Type 1 diabetes mellitus with hyperglycemia: Secondary | ICD-10-CM

## 2011-11-12 NOTE — Patient Instructions (Addendum)
blood tests are being requested for you today.  You will receive a letter with results. Please come back for a follow-up appointment in 3 months check your blood sugar 2 times a day.  vary the time of day when you check, between before the 3 meals, and at bedtime.  also check if you have symptoms of your blood sugar being too high or too low.  please keep a record of the readings and bring it to your next appointment here.  please call us sooner if your blood sugar goes below 70, or if it stays over 200.

## 2011-11-12 NOTE — Progress Notes (Signed)
Subjective:    Patient ID: [name], female    DOB: 01/21/1951, 61 y.o.   MRN: [id number]  HPI Pt returns for f/u of insulin-requiring DM (dx'ed 2001, complicated by retinopathy, CAD, PAD, nephropathy, and peripheral sensory neuropathy).  no cbg record, but states cbg's are "high," but she only checks in am.   Past Medical History  Diagnosis Date  . CHF (congestive heart failure)     EF 25%  . Cholelithiasis   . Diabetic retinopathy   . Diabetic nephropathy   . Hyperlipemia   . Diabetes mellitus   . Depression   . CAD (coronary artery disease)   . CVA (cerebral vascular accident)   . OSA (obstructive sleep apnea) 01/17/2011    Past Surgical History  Procedure Date  . Tonsillectomy   . Adenoidectomy   . Coronary artery bypass graft     X 5  . C-sections     x2  . Cholecystectomy   . Wisdom tooth extraction     x3  . Tracheostomy     History   Social History  . Marital Status: Divorced    Spouse Name: N/A    Number of Children: 1  . Years of Education: N/A   Occupational History  . Teacher     retired   Social History Main Topics  . Smoking status: Former Smoker -- 1 years    Types: Cigarettes    Quit date: 09/13/1968  . Smokeless tobacco: Not on file   Comment: occasional  . Alcohol Use: No  . Drug Use: No  . Sexually Active: Not on file   Other Topics Concern  . Not on file   Social History Narrative  . No narrative on file    Current Outpatient Prescriptions on File Prior to Visit  Medication Sig Dispense Refill  . ampicillin (PRINCIPEN) 500 MG capsule Take 500 mg by mouth every other day.       . Ascorbic Acid (VITAMIN C) 500 MG tablet 2 tablets by mouth once daily      . aspirin 81 MG tablet Take 81 mg by mouth daily.        . bisoprolol-hydrochlorothiazide (ZIAC) 10-6.25 MG per tablet TAKE ONE TABLET BY MOUTH ONE TIME DAILY  90 tablet  1  . Calcium-Phosphorus-Vitamin D (CALCIUM GUMMIES PO) Take 1 tablet by mouth daily.        .  Cholecalciferol (VITAMIN D3) 2000 UNITS TABS Take 1 tablet by mouth daily.        . Emollient (CERAVE) CREA Apply topically as directed.        . enalapril (VASOTEC) 10 MG tablet Take 1 tablet (10 mg total) by mouth 2 (two) times daily.  60 tablet  6  . FIBER SELECT GUMMIES PO Take 1 tablet by mouth daily.        . furosemide (LASIX) 20 MG tablet Take 1 tablet (20 mg total) by mouth every other day.  90 tablet  2  . hydrALAZINE (APRESOLINE) 25 MG tablet TAKE ONE TABLET BY MOUTH TWICE DAILY  60 tablet  4  . insulin NPH-insulin regular (NOVOLIN 70/30) (70-30) 100 UNIT/ML injection Inject subcutaneously 30 units with breakfast and 25 units with evening meal      . Multiple Vitamin (MULTIVITAMIN) tablet Take 1 tablet by mouth daily.        Marland Kitchen oxybutynin (DITROPAN-XL) 10 MG 24 hr tablet Take 1 tablet (10 mg total) by mouth daily.  30 tablet  11  .  simvastatin (ZOCOR) 40 MG tablet TAKE ONE TABLET BY MOUTH ONE TIME DAILY  30 tablet  5  . traMADol-acetaminophen (ULTRACET) 37.5-325 MG per tablet TAKE ONE TABLET BY MOUTH EVERY SIX HOURS AS NEEDED FOR PAIN  30 tablet  4  . DISCONTD: enalapril (VASOTEC) 10 MG tablet Take 1 tablet (10 mg total) by mouth daily.  60 tablet  11    Allergies  Allergen Reactions  . Okra     Family History  Problem Relation Age of Onset  . Hypertension Mother   . Diabetes Mother   . Heart disease Mother   . Heart disease Father     Cardiovascular accident    BP 136/69  Pulse 73  Temp 97.8 F (36.6 C) (Oral)  Ht 5\' 2"  (1.575 m)  Wt 260 lb 6 oz (118.105 kg)  BMI 47.62 kg/m2  SpO2 92%  Review of Systems denies hypoglycemia    Objective:   Physical Exam VITAL SIGNS:  See vs page GENERAL: no distress Pulses: dorsalis pedis intact bilat.   Feet: no deformity.  no ulcer on the feet.  feet are of normal color and temp.  no edema,.  There is a healed vein harvest scar at the right leg Neuro: sensation is intact to touch on the feet.    Lab Results  Component  Value Date   HGBA1C 7.1* 11/12/2011      Assessment & Plan:  DM.  this is the best control this pt should aim for, given this regimen, which does match insulin to her changing needs throughout the day

## 2011-11-13 DIAGNOSIS — E119 Type 2 diabetes mellitus without complications: Secondary | ICD-10-CM | POA: Diagnosis not present

## 2011-11-13 LAB — HM DIABETES EYE EXAM

## 2011-11-25 ENCOUNTER — Other Ambulatory Visit: Payer: Self-pay | Admitting: Endocrinology

## 2011-11-27 ENCOUNTER — Other Ambulatory Visit: Payer: Self-pay | Admitting: *Deleted

## 2011-11-27 MED ORDER — TRAMADOL-ACETAMINOPHEN 37.5-325 MG PO TABS: 1.0000 | ORAL_TABLET | Freq: Four times a day (QID) | ORAL | Status: DC | PRN

## 2011-12-20 ENCOUNTER — Other Ambulatory Visit: Payer: Self-pay | Admitting: Cardiology

## 2011-12-20 NOTE — Telephone Encounter (Signed)
..   Requested Prescriptions   Pending Prescriptions Disp Refills  . hydrALAZINE (APRESOLINE) 25 MG tablet [Pharmacy Med Name: HYDRALAZINE 25 MG   TAB HERI] 60 tablet 1    Sig: TAKE ONE TABLET BY MOUTH TWICE DAILY  ..Patient needs to contact office to schedule  Appointment  for future refills.Ph:[phone]. Thank you.

## 2011-12-31 ENCOUNTER — Other Ambulatory Visit: Payer: Self-pay | Admitting: Endocrinology

## 2012-01-03 ENCOUNTER — Other Ambulatory Visit: Payer: Self-pay | Admitting: Cardiology

## 2012-01-03 NOTE — Telephone Encounter (Signed)
..   Requested Prescriptions   Pending Prescriptions Disp Refills  . enalapril (VASOTEC) 10 MG tablet [Pharmacy Med Name: ENALAPRIL 10 MG     TAB WOCK] 60 tablet 1    Sig: TAKE ONE TABLET BY MOUTH TWICE DAILY  Patient needs to contact office to schedule a 2 months follow up Appointment. Need appointment to get future refills.Ph:[phone]. Thank you.

## 2012-02-11 ENCOUNTER — Ambulatory Visit: Payer: Medicare Other | Admitting: Endocrinology

## 2012-02-21 ENCOUNTER — Other Ambulatory Visit: Payer: Self-pay | Admitting: Cardiology

## 2012-02-21 NOTE — Telephone Encounter (Signed)
..   Requested Prescriptions   Pending Prescriptions Disp Refills  . furosemide (LASIX) 20 MG tablet [Pharmacy Med Name: FUROSEMIDE 20 MG    TAB QUAL] 45 tablet 3    Sig: TAKE ONE TABLET BY MOUTH EVERY OTHER DAY

## 2012-02-29 ENCOUNTER — Other Ambulatory Visit: Payer: Self-pay | Admitting: Cardiology

## 2012-03-08 ENCOUNTER — Other Ambulatory Visit: Payer: Self-pay | Admitting: Cardiology

## 2012-03-16 ENCOUNTER — Other Ambulatory Visit: Payer: Self-pay | Admitting: Endocrinology

## 2012-04-06 ENCOUNTER — Other Ambulatory Visit: Payer: Self-pay | Admitting: Cardiology

## 2012-04-17 ENCOUNTER — Other Ambulatory Visit: Payer: Self-pay | Admitting: Cardiology

## 2012-04-17 ENCOUNTER — Telehealth: Payer: Self-pay

## 2012-04-17 ENCOUNTER — Other Ambulatory Visit: Payer: Self-pay | Admitting: Endocrinology

## 2012-04-17 NOTE — Telephone Encounter (Signed)
Called pt to make an appt with Dr.Hochrein. Pt states she is unable to make an appt today because she has no way of getting here. Pt states she will call back . Informed pt she needs to make an appt to continue to receive refills. Pt understood. Refilled pt enalapril 10mg  2 week supply until she can make an appt.

## 2012-04-21 ENCOUNTER — Ambulatory Visit (INDEPENDENT_AMBULATORY_CARE_PROVIDER_SITE_OTHER): Payer: Medicare Other | Admitting: Endocrinology

## 2012-04-21 ENCOUNTER — Encounter: Payer: Self-pay | Admitting: Endocrinology

## 2012-04-21 VITALS — BP 124/63 | HR 100 | Wt 271.0 lb

## 2012-04-21 DIAGNOSIS — R0989 Other specified symptoms and signs involving the circulatory and respiratory systems: Secondary | ICD-10-CM

## 2012-04-21 DIAGNOSIS — R0609 Other forms of dyspnea: Secondary | ICD-10-CM | POA: Diagnosis not present

## 2012-04-21 DIAGNOSIS — I2510 Atherosclerotic heart disease of native coronary artery without angina pectoris: Secondary | ICD-10-CM

## 2012-04-21 DIAGNOSIS — E1029 Type 1 diabetes mellitus with other diabetic kidney complication: Secondary | ICD-10-CM

## 2012-04-21 DIAGNOSIS — E1065 Type 1 diabetes mellitus with hyperglycemia: Secondary | ICD-10-CM | POA: Diagnosis not present

## 2012-04-21 DIAGNOSIS — I509 Heart failure, unspecified: Secondary | ICD-10-CM | POA: Diagnosis not present

## 2012-04-21 DIAGNOSIS — R0600 Dyspnea, unspecified: Secondary | ICD-10-CM

## 2012-04-21 NOTE — Progress Notes (Signed)
Subjective:    Patient ID: [name], female    DOB: Sep 14, 1950, 62 y.o.   MRN: [id number]  HPI Pt returns for f/u of insulin-requiring DM (dx'ed 2001, complicated by retinopathy, CAD, PAD, nephropathy, and peripheral sensory neuropathy).  she brings a record of her cbg's which i have reviewed today.  Most are in the 100's, but a few are in the low-200's.  There is no trend throughout the day.   Pt states 2 weeks of slight worsening in her chronic dyspnea sxs in the chest.  No assoc LOC.  She also has slight chest-pressure with exertion.  This lasts a few minutes, and resolves promptly with rest. Past Medical History  Diagnosis Date  . CHF (congestive heart failure)     EF 25%  . Cholelithiasis   . Diabetic retinopathy(362.0)   . Diabetic nephropathy   . Hyperlipemia   . Diabetes mellitus   . Depression   . CAD (coronary artery disease)   . CVA (cerebral vascular accident)   . OSA (obstructive sleep apnea) 01/17/2011    Past Surgical History  Procedure Date  . Tonsillectomy   . Adenoidectomy   . Coronary artery bypass graft     X 5  . C-sections     x2  . Cholecystectomy   . Wisdom tooth extraction     x3  . Tracheostomy     History   Social History  . Marital Status: Divorced    Spouse Name: N/A    Number of Children: 1  . Years of Education: N/A   Occupational History  . Teacher     retired   Social History Main Topics  . Smoking status: Former Smoker -- 1 years    Types: Cigarettes    Quit date: 09/13/1968  . Smokeless tobacco: Not on file     Comment: occasional  . Alcohol Use: No  . Drug Use: No  . Sexually Active: Not on file   Other Topics Concern  . Not on file   Social History Narrative  . No narrative on file    Current Outpatient Prescriptions on File Prior to Visit  Medication Sig Dispense Refill  . ampicillin (PRINCIPEN) 500 MG capsule Take 500 mg by mouth every other day.       . Ascorbic Acid (VITAMIN C) 500 MG tablet 2  tablets by mouth once daily      . aspirin 81 MG tablet Take 81 mg by mouth daily.        . bisoprolol-hydrochlorothiazide (ZIAC) 10-6.25 MG per tablet TAKE ONE TABLET BY MOUTH ONE TIME DAILY  90 tablet  0  . Calcium-Phosphorus-Vitamin D (CALCIUM GUMMIES PO) Take 1 tablet by mouth daily.        . Cholecalciferol (VITAMIN D3) 2000 UNITS TABS Take 1 tablet by mouth daily.        . Emollient (CERAVE) CREA Apply topically as directed.        . enalapril (VASOTEC) 10 MG tablet TAKE ONE TABLET BY MOUTH TWICE DAILY  30 tablet  0  . FIBER SELECT GUMMIES PO Take 1 tablet by mouth daily.        . furosemide (LASIX) 20 MG tablet TAKE ONE TABLET BY MOUTH EVERY OTHER DAY  45 tablet  3  . hydrALAZINE (APRESOLINE) 25 MG tablet TAKE ONE TABLET BY MOUTH TWICE DAILY  60 tablet  12  . insulin NPH-insulin regular (NOVOLIN 70/30) (70-30) 100 UNIT/ML injection Inject subcutaneously 30 units with breakfast  and 25 units with evening meal      . Multiple Vitamin (MULTIVITAMIN) tablet Take 1 tablet by mouth daily.        Marland Kitchen oxybutynin (DITROPAN-XL) 10 MG 24 hr tablet TAKE ONE TABLET BY MOUTH ONE TIME DAILY  30 tablet  10  . simvastatin (ZOCOR) 40 MG tablet TAKE ONE TABLET BY MOUTH ONE TIME DAILY  30 tablet  4  . traMADol-acetaminophen (ULTRACET) 37.5-325 MG per tablet TAKE ONE TABLET BY MOUTH EVERY SIX HOURS AS NEEDED FOR PAIN  30 tablet  0  . [DISCONTINUED] oxybutynin (DITROPAN-XL) 10 MG 24 hr tablet Take 1 tablet (10 mg total) by mouth daily.  30 tablet  11    Allergies  Allergen Reactions  . Okra     Family History  Problem Relation Age of Onset  . Hypertension Mother   . Diabetes Mother   . Heart disease Mother   . Heart disease Father     Cardiovascular accident    BP 124/63  Pulse 100  Wt 271 lb (122.925 kg)  SpO2 94%    Review of Systems Denies weight change.  denies hypoglycemia.      Objective:   Physical Exam VITAL SIGNS:  See vs page GENERAL: no distress LUNGS:  Clear to  auscultation. Ext: no edema   Lab Results  Component Value Date   WBC 7.1 05/02/2011   HGB 14.1 05/02/2011   HCT 41.3 05/02/2011   PLT 196.0 05/02/2011   GLUCOSE 133* 04/21/2012   CHOL 145 05/02/2011   TRIG 161.0* 05/02/2011   HDL 36.40* 05/02/2011   LDLDIRECT 100.9 02/03/2008   LDLCALC 76 05/02/2011   ALT 20 05/02/2011   AST 23 05/02/2011   NA 138 04/21/2012   K 4.8 04/21/2012   CL 103 04/21/2012   CREATININE 1.71* 04/21/2012   BUN 31* 04/21/2012   CO2 26 04/21/2012   TSH 3.99 05/02/2011   INR 1.62* 01/19/2011   HGBA1C 7.2* 04/21/2012   MICROALBUR 53.3* 05/02/2011      Assessment & Plan:  DM: this is the best control this pt should aim for, given this regimen, which does match insulin to her changing needs throughout the day CHF, ? Mild exacerbation Chest pain, new, uncertain etiology

## 2012-04-21 NOTE — Patient Instructions (Addendum)
blood tests are being requested for you today.  We'll contact you with results.   Please come back for a "medicare wellness" appointment in 3 months check your blood sugar 2 times a day.  vary the time of day when you check, between before the 3 meals, and at bedtime.  also check if you have symptoms of your blood sugar being too high or too low.  please keep a record of the readings and bring it to your next appointment here.  please call us sooner if your blood sugar goes below 70, or if it stays over 200.  i have requested a heart test for you.  you will receive a phone call, about a day and time for an appointment. Until the heart test is done, please take it easy.  Don't do enough activity to bring on the chest pain.

## 2012-04-22 ENCOUNTER — Telehealth: Payer: Self-pay | Admitting: Cardiology

## 2012-04-22 LAB — BASIC METABOLIC PANEL
BUN: 31 mg/dL — ABNORMAL HIGH (ref 6–23)
Calcium: 10.2 mg/dL (ref 8.4–10.5)
Creat: 1.71 mg/dL — ABNORMAL HIGH (ref 0.50–1.10)
Glucose, Bld: 133 mg/dL — ABNORMAL HIGH (ref 70–99)

## 2012-04-22 LAB — BRAIN NATRIURETIC PEPTIDE: Brain Natriuretic Peptide: 105 pg/mL — ABNORMAL HIGH (ref 0.0–100.0)

## 2012-04-22 LAB — HEMOGLOBIN A1C: Hgb A1c MFr Bld: 7.2 % — ABNORMAL HIGH (ref <5.7)

## 2012-04-22 NOTE — Telephone Encounter (Signed)
Pt calling re stress test that dr Everardo All set up for her before her appt with hochrein, she wants to know if she should see dr hochrein first and let him decide if stress test is needed, pls advise

## 2012-04-22 NOTE — Telephone Encounter (Signed)
Left message for pt to call back to discuss if she feels the need.  Instructed pt that she should have the stress testing completed as ordered by Dr Everardo All however if she feels strongly about Dr Antoine Poche seeing her first she will need to call to cancel the scheduled testing.

## 2012-04-28 ENCOUNTER — Ambulatory Visit (HOSPITAL_COMMUNITY): Payer: Medicare Other | Attending: Cardiovascular Disease | Admitting: Radiology

## 2012-04-28 VITALS — BP 143/53 | Ht 61.0 in | Wt 269.0 lb

## 2012-04-28 DIAGNOSIS — E119 Type 2 diabetes mellitus without complications: Secondary | ICD-10-CM | POA: Insufficient documentation

## 2012-04-28 DIAGNOSIS — R0609 Other forms of dyspnea: Secondary | ICD-10-CM | POA: Diagnosis not present

## 2012-04-28 DIAGNOSIS — I2510 Atherosclerotic heart disease of native coronary artery without angina pectoris: Secondary | ICD-10-CM

## 2012-04-28 DIAGNOSIS — I447 Left bundle-branch block, unspecified: Secondary | ICD-10-CM | POA: Diagnosis not present

## 2012-04-28 DIAGNOSIS — R42 Dizziness and giddiness: Secondary | ICD-10-CM | POA: Insufficient documentation

## 2012-04-28 DIAGNOSIS — R079 Chest pain, unspecified: Secondary | ICD-10-CM | POA: Insufficient documentation

## 2012-04-28 DIAGNOSIS — R0989 Other specified symptoms and signs involving the circulatory and respiratory systems: Secondary | ICD-10-CM | POA: Insufficient documentation

## 2012-04-28 DIAGNOSIS — I10 Essential (primary) hypertension: Secondary | ICD-10-CM | POA: Insufficient documentation

## 2012-04-28 DIAGNOSIS — R000 Tachycardia, unspecified: Secondary | ICD-10-CM | POA: Diagnosis not present

## 2012-04-28 MED ORDER — ADENOSINE (DIAGNOSTIC) 3 MG/ML IV SOLN
0.5600 mg/kg | Freq: Once | INTRAVENOUS | Status: AC
  Administered 2012-04-28: 60 mg via INTRAVENOUS

## 2012-04-28 MED ORDER — TECHNETIUM TC 99M SESTAMIBI GENERIC - CARDIOLITE
30.0000 | Freq: Once | INTRAVENOUS | Status: AC | PRN
  Administered 2012-04-28: 30 via INTRAVENOUS

## 2012-04-28 NOTE — Progress Notes (Signed)
[facility] SITE 3 NUCLEAR MED [address] [place], [place] [address] [phone]    Cardiology Nuclear Med Study  [name] is a 62 y.o. female     MRN : [id number]     DOB: [birth year]  Procedure Date: 04/28/2012  Nuclear Med Background Indication for Stress Test:  Evaluation for Ischemia and Graft Patency History:  01' CABG x5;CHF;1/09' Echo 20-25%;01' Heart Catheterization>cabg;8/11' Myocardial Perfusion Study apical inf/ant ischemia basal-mid inf scar EF 35% Cardiac Risk Factors: CVA, Family History - CAD, History of Smoking, Hypertension, IDDM Type 2, LBBB, Lipids and Obesity  Symptoms:  Chest Pain, Chest Pain with Exertion (last date of chest discomfort 1/13 11:30 am), Dizziness, DOE, Light-Headedness, Near Syncope, Rapid HR and SOB   Nuclear Pre-Procedure Caffeine/Decaff Intake:  None NPO After: 1030   Lungs:  clear O2 Sat: 97% on room air. IV 0.9% NS with Angio Cath:  20g  IV Site: R Hand  IV Started by:  Cathlyn Parsons, RN  Chest Size (in):  40 Cup Size: C  Height: 5\' 1"  (1.549 m)  Weight:  269 lb (122.018 kg)  BMI:  Body mass index is 50.83 kg/(m^2). Tech Comments:  Ziac taken last pm but not sure    Nuclear Med Study 1 or 2 day study: 2 day  Stress Test Type:  Adenosine  Reading MD: Charlton Haws, MD  Order Authorizing Provider:  Dr. Melany Guernsey  Resting Radionuclide: Technetium 8m Sestamibi  Resting Radionuclide Dose: 33.0 mCi  04/29/12  Stress Radionuclide:  Technetium 24m Sestamibi  Stress Radionuclide Dose: 33.0 mCi  04/28/12          Stress Protocol Rest HR: 100 Stress HR: 103  Rest BP: 143/53 Stress BP: 137/55  Exercise Time (min): n/a METS: n/a   Predicted Max HR: 158 bpm % Max HR: 65.19 bpm Rate Pressure Product: 17494    Dose of Adenosine (mg):  60 Dose of Lexiscan: n/a mg  Dose of Atropine (mg): n/a Dose of Dobutamine: n/a mcg/kg/min (at max HR)  Stress Test Technologist: Frederick Peers, EMT-P  Nuclear  Technologist:  Domenic Polite, CNMT     Rest Procedure:  Myocardial perfusion imaging was performed at rest 45 minutes following the intravenous administration of Technetium 56m Sestamibi. Rest ECG: NSR-LBBB  Stress Procedure:  The patient received IV adenosine at 140 mcg/kg/min for 4 minutes.  Technetium 9m Sestamibi was injected at the 2 minute mark and quantitative spect images were obtained after a 45 minute delay. Stress ECG: Uninteretable due to baseline LBBB  QPS Raw Data Images:  Acquisition technically good; LVE. Stress Images:  There is decreased uptake in the inferior wall. Rest Images:  There is decreased uptake in the inferior wall, less prominent compared to the stress images. Subtraction (SDS):  These findings are consistent with prior inferior infarct and mild peri-infarct ischemia. Transient Ischemic Dilatation (Normal <1.22):  1.01 Lung/Heart Ratio (Normal <0.45):  0.28  Quantitative Gated Spect Images QGS EDV:  233 ml QGS ESV:  183 ml  Impression Exercise Capacity:  Adenosine study with no exercise. BP Response:  Normal blood pressure response. Clinical Symptoms:  There is chest pain. ECG Impression:  Baseline:  LBBB.  EKG uninterpretable due to LBBB at rest and stress. Comparison with Prior Nuclear Study: No images to compare  Overall Impression:  High risk stress nuclear study due to severity of LV dysfunction; there is a moderate size, medium intensity, partially reversible inferior defect consistent with prior inferior infarct and mild  peri-infarct ischemia.  LV Ejection Fraction: 21%.  LV Wall Motion:  Global hypokinesis.    Olga Millers

## 2012-04-29 ENCOUNTER — Ambulatory Visit (HOSPITAL_COMMUNITY): Payer: Medicare Other | Attending: Cardiology

## 2012-04-29 DIAGNOSIS — R0989 Other specified symptoms and signs involving the circulatory and respiratory systems: Secondary | ICD-10-CM

## 2012-04-29 MED ORDER — TECHNETIUM TC 99M SESTAMIBI GENERIC - CARDIOLITE
33.0000 | Freq: Once | INTRAVENOUS | Status: AC | PRN
  Administered 2012-04-29: 33 via INTRAVENOUS

## 2012-04-30 ENCOUNTER — Ambulatory Visit (INDEPENDENT_AMBULATORY_CARE_PROVIDER_SITE_OTHER): Payer: Medicare Other | Admitting: Cardiology

## 2012-04-30 ENCOUNTER — Ambulatory Visit: Payer: Medicare Other | Admitting: Cardiology

## 2012-04-30 ENCOUNTER — Encounter: Payer: Self-pay | Admitting: Cardiology

## 2012-04-30 VITALS — BP 131/77 | HR 99 | Ht 62.0 in | Wt 266.0 lb

## 2012-04-30 DIAGNOSIS — R079 Chest pain, unspecified: Secondary | ICD-10-CM

## 2012-04-30 MED ORDER — NITROGLYCERIN 0.4 MG SL SUBL: 0.4000 mg | SUBLINGUAL_TABLET | SUBLINGUAL | Status: AC | PRN

## 2012-04-30 MED ORDER — ISOSORBIDE MONONITRATE ER 60 MG PO TB24: 60.0000 mg | ORAL_TABLET | Freq: Every day | ORAL | Status: DC

## 2012-04-30 NOTE — Progress Notes (Signed)
HPI:  The patient presents for followup of CAD. The patient presents for primary physician recently and was describing some chest discomfort. This seems to happen with activity such as walking up her stairs. She is not describing any new resting chest discomfort, neck or arm discomfort. She describes some burning discomfort in her chest. She's not having any new palpitations, presyncope or syncope. She's not having any new PND or orthopnea. However, she thinks her breathing is a little more labored with activities such as while talking. I did review a stress perfusion study done today. This demonstrated a moderate size, medium intensity, partially reversible inferior defect consistent with prior inferior infarct and mild peri-infarct ischemia. The EF was 21% with global hypokinesis. Recent echo demonstrated the EF to be about 30%.  Allergies  Allergen Reactions  . Okra     Current Outpatient Prescriptions  Medication Sig Dispense Refill  . ampicillin (PRINCIPEN) 500 MG capsule Take 500 mg by mouth every other day.       . Ascorbic Acid (VITAMIN C) 500 MG tablet 2 tablets by mouth once daily      . aspirin 81 MG tablet Take 81 mg by mouth daily.        . bisoprolol-hydrochlorothiazide (ZIAC) 10-6.25 MG per tablet TAKE ONE TABLET BY MOUTH ONE TIME DAILY  90 tablet  0  . Calcium-Phosphorus-Vitamin D (CALCIUM GUMMIES PO) Take 1 tablet by mouth daily.        . Emollient (CERAVE) CREA Apply topically as directed.        . enalapril (VASOTEC) 10 MG tablet TAKE ONE TABLET BY MOUTH TWICE DAILY  30 tablet  0  . FIBER SELECT GUMMIES PO Take 1 tablet by mouth daily.        . furosemide (LASIX) 20 MG tablet TAKE ONE TABLET BY MOUTH EVERY OTHER DAY  45 tablet  3  . hydrALAZINE (APRESOLINE) 25 MG tablet TAKE ONE TABLET BY MOUTH TWICE DAILY  60 tablet  12  . insulin NPH-insulin regular (NOVOLIN 70/30) (70-30) 100 UNIT/ML injection Inject subcutaneously 30 units with breakfast and 25 units with evening meal       . Multiple Vitamin (MULTIVITAMIN) tablet Take 1 tablet by mouth daily.        Marland Kitchen oxybutynin (DITROPAN-XL) 10 MG 24 hr tablet TAKE ONE TABLET BY MOUTH ONE TIME DAILY  30 tablet  10  . simvastatin (ZOCOR) 40 MG tablet TAKE ONE TABLET BY MOUTH ONE TIME DAILY  30 tablet  4  . traMADol-acetaminophen (ULTRACET) 37.5-325 MG per tablet TAKE ONE TABLET BY MOUTH EVERY SIX HOURS AS NEEDED FOR PAIN  30 tablet  0  . [DISCONTINUED] oxybutynin (DITROPAN-XL) 10 MG 24 hr tablet Take 1 tablet (10 mg total) by mouth daily.  30 tablet  11    Past Medical History  Diagnosis Date  . CHF (congestive heart failure)     EF 25%  . Cholelithiasis   . Diabetic retinopathy(362.0)   . Diabetic nephropathy   . Hyperlipemia   . Diabetes mellitus   . Depression   . CAD (coronary artery disease)   . CVA (cerebral vascular accident)   . OSA (obstructive sleep apnea) 01/17/2011    Past Surgical History  Procedure Date  . Tonsillectomy   . Adenoidectomy   . Coronary artery bypass graft     X 5  . C-sections     x2  . Cholecystectomy   . Wisdom tooth extraction     x3  .  Tracheostomy     ROS:  As stated in the HPI and negative for all other systems.  PHYSICAL EXAM BP 131/77  Pulse 99  Ht 5\' 2"  (1.575 m)  Wt 266 lb (120.657 kg)  BMI 48.65 kg/m2 GENERAL:  Well appearing NECK:  No jugular venous distention, waveform within normal limits, carotid upstroke brisk and symmetric, no bruits, no thyromegaly LYMPHATICS:  No cervical, inguinal adenopathy LUNGS:  Clear to auscultation bilaterally BACK:  No CVA tenderness CHEST:  Unremarkable HEART:  PMI not displaced or sustained,S1 and S2 within normal limits, no S3, no S4, no clicks, no rubs, no murmurs ABD:  Flat, positive bowel sounds normal in frequency in pitch, no bruits, no rebound, no guarding, no midline pulsatile mass, no hepatomegaly, no splenomegaly EXT:  2 plus pulses throughout, no edema, no cyanosis no clubbing   ASSESSMENT AND PLAN  CAD - I  have reviewed carefully the patient's symptoms and the results of the stress perfusion study. I have compared this to her previous perfusion study. Her ejection fraction is essentially unchanged from previous. She does have the inferior wall defect which is consistent with recent infarct and peri-infarct ischemia. This is not substantially different than her previous. She has renal insufficiency which would make cardiac catheterization somewhat high risk. I discussed this with her in detail. At this point I would suggest medical management with the addition of a long-acting nitrate. If however she continues to have her exertional angina which is new in onset she would need to have cardiac catheterization. With her renal insufficiency she would need to be hydrated prior to this.  CHRONIC SYSTOLIC HEART FAILURE - She seems to be euvolemic.  At this point, no change in therapy is indicated.  We have reviewed salt and fluid restrictions.  No further cardiovascular testing is indicated.  ESSENTIAL HYPERTENSION, BENIGN -  This is being managed in the context of treating his CHF   Renal insufficiency -  As above we will watch this closely.

## 2012-04-30 NOTE — Patient Instructions (Addendum)
Please start Imdur 60 mg a day Use SL Ntg as needed for chest pain. Continue all other medications as listed  Follow up in 10 days with Dr Antoine Poche or PA.  Nitroglycerin sublingual tablets What is this medicine? NITROGLYCERIN (nye troe GLI ser in) is a type of vasodilator. It relaxes blood vessels, increasing the blood and oxygen supply to your heart. This medicine is used to relieve chest pain caused by angina. It is also used to prevent chest pain before activities like climbing stairs, going outdoors in cold weather, or sexual activity. This medicine may be used for other purposes; ask your health care provider or pharmacist if you have questions. What should I tell my health care provider before I take this medicine? They need to know if you have any of these conditions: -anemia -head injury, recent stroke, or bleeding in the brain -liver disease -previous heart attack -an unusual or allergic reaction to nitroglycerin, other medicines, foods, dyes, or preservatives -pregnant or trying to get pregnant -breast-feeding How should I use this medicine? Take this medicine by mouth as needed. At the first sign of an angina attack (chest pain or tightness) place one tablet under your tongue. You can also take this medicine 5 to 10 minutes before an event likely to produce chest pain. Follow the directions on the prescription label. Let the tablet dissolve under the tongue. Do not swallow whole. Replace the dose if you accidentally swallow it. It will help if your mouth is not dry. Saliva around the tablet will help it to dissolve more quickly. Do not eat or drink, smoke or chew tobacco while a tablet is dissolving. If you are not better within 5 minutes after taking ONE dose of nitroglycerin, call 9-1-1 immediately to seek emergency medical care. Do not take more than 3 nitroglycerin tablets over 15 minutes. If you take this medicine often to relieve symptoms of angina, your doctor or health care  professional may provide you with different instructions to manage your symptoms. If symptoms do not go away after following these instructions, it is important to call 9-1-1 immediately. Do not take more than 3 nitroglycerin tablets over 15 minutes. Talk to your pediatrician regarding the use of this medicine in children. Special care may be needed. Overdosage: If you think you have taken too much of this medicine contact a poison control center or emergency room at once. NOTE: This medicine is only for you. Do not share this medicine with others. What if I miss a dose? This does not apply. This medicine is only used as needed. What may interact with this medicine? Do not take this medicine with any of the following medications: -certain migraine medicines like ergotamine and dihydroergotamine (DHE) -medicines used to treat erectile dysfunction like sildenafil, tadalafil, and vardenafil This medicine may also interact with the following medications: -alteplase -aspirin -heparin -medicines for high blood pressure -medicines for mental depression -other medicines used to treat angina -phenothiazines like chlorpromazine, mesoridazine, prochlorperazine, thioridazine This list may not describe all possible interactions. Give your health care provider a list of all the medicines, herbs, non-prescription drugs, or dietary supplements you use. Also tell them if you smoke, drink alcohol, or use illegal drugs. Some items may interact with your medicine. What should I watch for while using this medicine? Tell your doctor or health care professional if you feel your medicine is no longer working. Keep this medicine with you at all times. Sit or lie down when you take your medicine to  prevent falling if you feel dizzy or faint after using it. Try to remain calm. This will help you to feel better faster. If you feel dizzy, take several deep breaths and lie down with your feet propped up, or bend forward with  your head resting between your knees. You may get drowsy or dizzy. Do not drive, use machinery, or do anything that needs mental alertness until you know how this drug affects you. Do not stand or sit up quickly, especially if you are an older patient. This reduces the risk of dizzy or fainting spells. Alcohol can make you more drowsy and dizzy. Avoid alcoholic drinks. Do not treat yourself for coughs, colds, or pain while you are taking this medicine without asking your doctor or health care professional for advice. Some ingredients may increase your blood pressure. What side effects may I notice from receiving this medicine? Side effects that you should report to your doctor or health care professional as soon as possible: -blurred vision -dry mouth -skin rash -sweating -the feeling of extreme pressure in the head -unusually weak or tired Side effects that usually do not require medical attention (report to your doctor or health care professional if they continue or are bothersome): -flushing of the face or neck -headache -irregular heartbeat, palpitations -nausea, vomiting This list may not describe all possible side effects. Call your doctor for medical advice about side effects. You may report side effects to FDA at 1-800-FDA-1088. Where should I keep my medicine? Keep out of the reach of children. Store at room temperature between 20 and 25 degrees C (68 and 77 degrees F). Store in Retail buyer. Protect from light and moisture. Keep tightly closed. Throw away any unused medicine after the expiration date. NOTE: This sheet is a summary. It may not cover all possible information. If you have questions about this medicine, talk to your doctor, pharmacist, or health care provider.  2012, [employer] Standard. (10/24/2007 5:16:24 PM)

## 2012-05-01 ENCOUNTER — Encounter (HOSPITAL_COMMUNITY): Payer: Medicare Other

## 2012-05-12 ENCOUNTER — Encounter: Payer: Self-pay | Admitting: Cardiology

## 2012-05-12 ENCOUNTER — Other Ambulatory Visit: Payer: Self-pay | Admitting: Endocrinology

## 2012-05-12 ENCOUNTER — Ambulatory Visit (INDEPENDENT_AMBULATORY_CARE_PROVIDER_SITE_OTHER): Payer: Medicare Other | Admitting: Cardiology

## 2012-05-12 VITALS — BP 130/63 | HR 81 | Ht 62.0 in | Wt 270.8 lb

## 2012-05-12 DIAGNOSIS — I5022 Chronic systolic (congestive) heart failure: Secondary | ICD-10-CM

## 2012-05-12 DIAGNOSIS — I6350 Cerebral infarction due to unspecified occlusion or stenosis of unspecified cerebral artery: Secondary | ICD-10-CM

## 2012-05-12 DIAGNOSIS — I10 Essential (primary) hypertension: Secondary | ICD-10-CM | POA: Diagnosis not present

## 2012-05-12 DIAGNOSIS — I509 Heart failure, unspecified: Secondary | ICD-10-CM

## 2012-05-12 DIAGNOSIS — I2510 Atherosclerotic heart disease of native coronary artery without angina pectoris: Secondary | ICD-10-CM

## 2012-05-12 NOTE — Progress Notes (Signed)
HPI:  The patient presents for followup of CAD. I saw her recently and she was describing some increased chest discomfort with minimal activity. I sent her for a stress perfusion study. I reviewed carefully at the last visit the patient's symptoms and the results of the stress perfusion study. I have compared this to her previous perfusion study. Her ejection fraction is essentially unchanged from previous. She does have the inferior wall defect which is consistent with infarct and peri-infarct ischemia. This is not substantially different than her previous. She has renal insufficiency which would make cardiac catheterization somewhat high risk. Therefore, I opted to manage her medically.  I added Imdur. Since that time she has done better. She's not had any of the chest discomfort that she was getting her with minimal exertion. She does have some dyspnea but this is baseline. She can't lie on her side at night but not on her back. She's not had to use any sublingual nitroglycerin.  Allergies  Allergen Reactions  . Okra     Current Outpatient Prescriptions  Medication Sig Dispense Refill  . ampicillin (PRINCIPEN) 500 MG capsule Take 500 mg by mouth every other day.       . Ascorbic Acid (VITAMIN C) 500 MG tablet 2 tablets by mouth once daily      . aspirin 81 MG tablet Take 81 mg by mouth daily.        . bisoprolol-hydrochlorothiazide (ZIAC) 10-6.25 MG per tablet TAKE ONE TABLET BY MOUTH ONE TIME DAILY  90 tablet  0  . Calcium-Phosphorus-Vitamin D (CALCIUM GUMMIES PO) Take 1 tablet by mouth daily.        . Emollient (CERAVE) CREA Apply topically as directed.        . enalapril (VASOTEC) 10 MG tablet TAKE ONE TABLET BY MOUTH TWICE DAILY  30 tablet  0  . FIBER SELECT GUMMIES PO Take 1 tablet by mouth daily.        . furosemide (LASIX) 20 MG tablet TAKE ONE TABLET BY MOUTH EVERY OTHER DAY  45 tablet  3  . hydrALAZINE (APRESOLINE) 25 MG tablet TAKE ONE TABLET BY MOUTH TWICE DAILY  60 tablet  12  .  insulin NPH-insulin regular (NOVOLIN 70/30) (70-30) 100 UNIT/ML injection Inject subcutaneously 30 units with breakfast and 25 units with evening meal      . isosorbide mononitrate (IMDUR) 60 MG 24 hr tablet Take 1 tablet (60 mg total) by mouth daily.  30 tablet  11  . Multiple Vitamin (MULTIVITAMIN) tablet Take 1 tablet by mouth daily.        . nitroGLYCERIN (NITROSTAT) 0.4 MG SL tablet Place 1 tablet (0.4 mg total) under the tongue every 5 (five) minutes as needed for chest pain.  25 tablet  11  . oxybutynin (DITROPAN-XL) 10 MG 24 hr tablet TAKE ONE TABLET BY MOUTH ONE TIME DAILY  30 tablet  10  . simvastatin (ZOCOR) 40 MG tablet TAKE ONE TABLET BY MOUTH ONE TIME DAILY  30 tablet  4  . traMADol-acetaminophen (ULTRACET) 37.5-325 MG per tablet TAKE ONE TABLET BY MOUTH EVERY SIX HOURS AS NEEDED FOR PAIN  30 tablet  0  . [DISCONTINUED] oxybutynin (DITROPAN-XL) 10 MG 24 hr tablet Take 1 tablet (10 mg total) by mouth daily.  30 tablet  11    Past Medical History  Diagnosis Date  . CHF (congestive heart failure)     EF 25%  . Cholelithiasis   . Diabetic retinopathy(362.0)   . Diabetic  nephropathy   . Hyperlipemia   . Diabetes mellitus   . Depression   . CAD (coronary artery disease)   . CVA (cerebral vascular accident)   . OSA (obstructive sleep apnea) 01/17/2011    Past Surgical History  Procedure Date  . Tonsillectomy   . Adenoidectomy   . Coronary artery bypass graft     X 5  . C-sections     x2  . Cholecystectomy   . Wisdom tooth extraction     x3  . Tracheostomy     ROS:  As stated in the HPI and negative for all other systems.  PHYSICAL EXAM BP 130/63  Pulse 81  Ht 5\' 2"  (1.575 m)  Wt 270 lb 12.8 oz (122.834 kg)  BMI 49.53 kg/m2 GENERAL:  Well appearing NECK:  No jugular venous distention, waveform within normal limits, carotid upstroke brisk and symmetric, no bruits, no thyromegaly LYMPHATICS:  No cervical, inguinal adenopathy LUNGS:  Clear to auscultation  bilaterally BACK:  No CVA tenderness CHEST:  Unremarkable HEART:  PMI not displaced or sustained,S1 and S2 within normal limits, no S3, no S4, no clicks, no rubs, no murmurs ABD:  Flat, positive bowel sounds normal in frequency in pitch, no bruits, no rebound, no guarding, no midline pulsatile mass, no hepatomegaly, no splenomegaly EXT:  2 plus pulses throughout, no edema, no cyanosis no clubbing   ASSESSMENT AND PLAN  CAD - At this point I think she's doing better with medical management. We can avoid a high risk catheterization. She will continue with her current regimen.  CHRONIC SYSTOLIC HEART FAILURE - She seems to be euvolemic and the last BNP was only slightly above normal.  At this point, no change in therapy is indicated.  We have reviewed salt and fluid restrictions.  No further cardiovascular testing is indicated.  ESSENTIAL HYPERTENSION, BENIGN -  This is being managed in the context of treating his CHF   Renal insufficiency -  As above we will watch this closely.  Creat was stable at 1.71 earlier this month.   Hyperlipidemia - I am going to order a fasting lipid profile.  I would adjust to Crestor according to guidelines unless her cholesterol is as good as it was last time.

## 2012-05-12 NOTE — Patient Instructions (Addendum)
The current medical regimen is effective;  continue present plan and medications.  Please return for fasting blood work (lipid)  Follow up in 6 months with Dr Antoine Poche.  You will receive a letter in the mail 2 months before you are due.  Please call us when you receive this letter to schedule your follow up appointment.

## 2012-05-13 ENCOUNTER — Other Ambulatory Visit: Payer: Self-pay | Admitting: Endocrinology

## 2012-05-13 ENCOUNTER — Telehealth: Payer: Self-pay | Admitting: Endocrinology

## 2012-05-13 ENCOUNTER — Other Ambulatory Visit: Payer: Self-pay

## 2012-05-13 NOTE — Telephone Encounter (Signed)
Patient Information:  Caller Name: Carilyn  Phone: [phone]  Patient: [name], [name]  Gender: Female  DOB: [birth year]  Age: 62 Years  PCP: Romero Belling (Adults only)  Office Follow Up:  Does the office need to follow up with this patient?: Yes  Instructions For The Office: Rx request.  RN Note:  Last appointment 04/21/12 with PCP.  Symptoms  Reason For Call & Symptoms: Patient reports that she recently saw Dr. Everardo All and he was to give refills on all her Rx meds.  [facility] informed her they did not recieve renewal for Ultracet.  She requests refill for this, stating it helps her move better during the day.     [facility] phone number [phone]  Reviewed Health History In EMR: Yes  Reviewed Medications In EMR: Yes  Reviewed Allergies In EMR: Yes  Reviewed Surgeries / Procedures: Yes  Date of Onset of Symptoms: Unknown  Guideline(s) Used:  No Protocol Available - Information Only  Disposition Per Guideline:   Discuss with PCP and Callback by Nurse Today  Reason For Disposition Reached:   Nursing judgment  Advice Given:  Call Back If:  New symptoms develop  You become worse.

## 2012-05-13 NOTE — Telephone Encounter (Signed)
Please refill prn 

## 2012-05-16 ENCOUNTER — Other Ambulatory Visit (INDEPENDENT_AMBULATORY_CARE_PROVIDER_SITE_OTHER): Payer: Medicare Other

## 2012-05-16 DIAGNOSIS — N289 Disorder of kidney and ureter, unspecified: Secondary | ICD-10-CM

## 2012-05-16 DIAGNOSIS — I428 Other cardiomyopathies: Secondary | ICD-10-CM | POA: Diagnosis not present

## 2012-05-16 DIAGNOSIS — I2510 Atherosclerotic heart disease of native coronary artery without angina pectoris: Secondary | ICD-10-CM

## 2012-05-16 LAB — BASIC METABOLIC PANEL
BUN: 48 mg/dL — ABNORMAL HIGH (ref 6–23)
Calcium: 9 mg/dL (ref 8.4–10.5)
Creatinine, Ser: 1.9 mg/dL — ABNORMAL HIGH (ref 0.4–1.2)
GFR: 28.93 mL/min — ABNORMAL LOW (ref >60.00)
Glucose, Bld: 137 mg/dL — ABNORMAL HIGH (ref 70–99)

## 2012-05-17 ENCOUNTER — Other Ambulatory Visit: Payer: Self-pay | Admitting: Cardiology

## 2012-05-19 ENCOUNTER — Other Ambulatory Visit: Payer: Self-pay | Admitting: *Deleted

## 2012-05-19 DIAGNOSIS — N289 Disorder of kidney and ureter, unspecified: Secondary | ICD-10-CM

## 2012-05-19 MED ORDER — ENALAPRIL MALEATE 10 MG PO TABS: 10.0000 mg | ORAL_TABLET | Freq: Two times a day (BID) | ORAL | Status: DC

## 2012-05-23 ENCOUNTER — Encounter: Payer: Self-pay | Admitting: Endocrinology

## 2012-05-24 ENCOUNTER — Other Ambulatory Visit: Payer: Self-pay | Admitting: Endocrinology

## 2012-05-26 ENCOUNTER — Other Ambulatory Visit (INDEPENDENT_AMBULATORY_CARE_PROVIDER_SITE_OTHER): Payer: Medicare Other

## 2012-05-26 DIAGNOSIS — N289 Disorder of kidney and ureter, unspecified: Secondary | ICD-10-CM | POA: Diagnosis not present

## 2012-05-26 LAB — BASIC METABOLIC PANEL
CO2: 27 mEq/L (ref 19–32)
Calcium: 9.4 mg/dL (ref 8.4–10.5)
Chloride: 101 mEq/L (ref 96–112)
Creatinine, Ser: 1.8 mg/dL — ABNORMAL HIGH (ref 0.4–1.2)
Glucose, Bld: 138 mg/dL — ABNORMAL HIGH (ref 70–99)
Sodium: 137 mEq/L (ref 135–145)

## 2012-07-23 ENCOUNTER — Ambulatory Visit: Payer: Medicare Other | Admitting: Endocrinology

## 2012-07-27 ENCOUNTER — Other Ambulatory Visit: Payer: Self-pay | Admitting: Endocrinology

## 2012-07-28 ENCOUNTER — Other Ambulatory Visit: Payer: Self-pay | Admitting: *Deleted

## 2012-07-28 MED ORDER — BISOPROLOL-HYDROCHLOROTHIAZIDE 10-6.25 MG PO TABS: 1.0000 | ORAL_TABLET | Freq: Every day | ORAL | Status: DC

## 2012-10-11 ENCOUNTER — Other Ambulatory Visit: Payer: Self-pay | Admitting: Endocrinology

## 2012-11-18 DIAGNOSIS — E119 Type 2 diabetes mellitus without complications: Secondary | ICD-10-CM | POA: Diagnosis not present

## 2012-11-20 ENCOUNTER — Other Ambulatory Visit: Payer: Self-pay | Admitting: *Deleted

## 2012-11-20 ENCOUNTER — Other Ambulatory Visit: Payer: Self-pay | Admitting: Endocrinology

## 2012-11-20 MED ORDER — SIMVASTATIN 40 MG PO TABS: 40.0000 mg | ORAL_TABLET | Freq: Every day | ORAL | Status: DC

## 2012-12-08 ENCOUNTER — Other Ambulatory Visit: Payer: Self-pay | Admitting: Endocrinology

## 2012-12-08 ENCOUNTER — Other Ambulatory Visit: Payer: Self-pay

## 2012-12-08 ENCOUNTER — Other Ambulatory Visit: Payer: Self-pay | Admitting: *Deleted

## 2012-12-08 MED ORDER — TRAMADOL-ACETAMINOPHEN 37.5-325 MG PO TABS: 1.0000 | ORAL_TABLET | ORAL | Status: DC | PRN

## 2012-12-16 ENCOUNTER — Other Ambulatory Visit: Payer: Self-pay | Admitting: *Deleted

## 2012-12-16 MED ORDER — INSULIN NPH ISOPHANE & REGULAR (70-30) 100 UNIT/ML ~~LOC~~ SUSP: SUBCUTANEOUS | Status: DC

## 2012-12-17 ENCOUNTER — Other Ambulatory Visit: Payer: Self-pay | Admitting: *Deleted

## 2012-12-17 MED ORDER — INSULIN NPH ISOPHANE & REGULAR (70-30) 100 UNIT/ML ~~LOC~~ SUSP: SUBCUTANEOUS | Status: DC

## 2012-12-17 NOTE — Telephone Encounter (Signed)
Rx sent to wrong pharmacy.

## 2012-12-21 ENCOUNTER — Other Ambulatory Visit: Payer: Self-pay | Admitting: Cardiology

## 2012-12-22 ENCOUNTER — Other Ambulatory Visit: Payer: Self-pay | Admitting: *Deleted

## 2012-12-22 MED ORDER — ENALAPRIL MALEATE 10 MG PO TABS: ORAL_TABLET | ORAL | Status: DC

## 2012-12-30 ENCOUNTER — Other Ambulatory Visit: Payer: Self-pay | Admitting: *Deleted

## 2012-12-30 MED ORDER — INSULIN NPH ISOPHANE & REGULAR (70-30) 100 UNIT/ML ~~LOC~~ SUSP: SUBCUTANEOUS | Status: DC

## 2013-01-02 ENCOUNTER — Other Ambulatory Visit: Payer: Self-pay | Admitting: *Deleted

## 2013-01-02 MED ORDER — TRAMADOL-ACETAMINOPHEN 37.5-325 MG PO TABS: 1.0000 | ORAL_TABLET | ORAL | Status: DC | PRN

## 2013-01-03 ENCOUNTER — Other Ambulatory Visit: Payer: Self-pay | Admitting: Cardiology

## 2013-01-23 ENCOUNTER — Other Ambulatory Visit: Payer: Self-pay

## 2013-01-23 ENCOUNTER — Other Ambulatory Visit: Payer: Self-pay | Admitting: Endocrinology

## 2013-01-24 ENCOUNTER — Other Ambulatory Visit: Payer: Self-pay | Admitting: Internal Medicine

## 2013-01-24 NOTE — Telephone Encounter (Signed)
Pt PCP is Dr. Everardo All forwarding to his assistant.Marland KitchenRaechel Chute

## 2013-02-19 IMAGING — PX DG ORTHOPANTOGRAM /PANORAMIC
1 series · 1 of 1 positions shown · non-contrast
Comparison: None.

CLINICAL DATA: Possible dental caries appear

DG ORTHOPANTOGRAM/PANORAMIC

[Series 1: — · U · 1 of 1 slices shown]
[im 1/1]
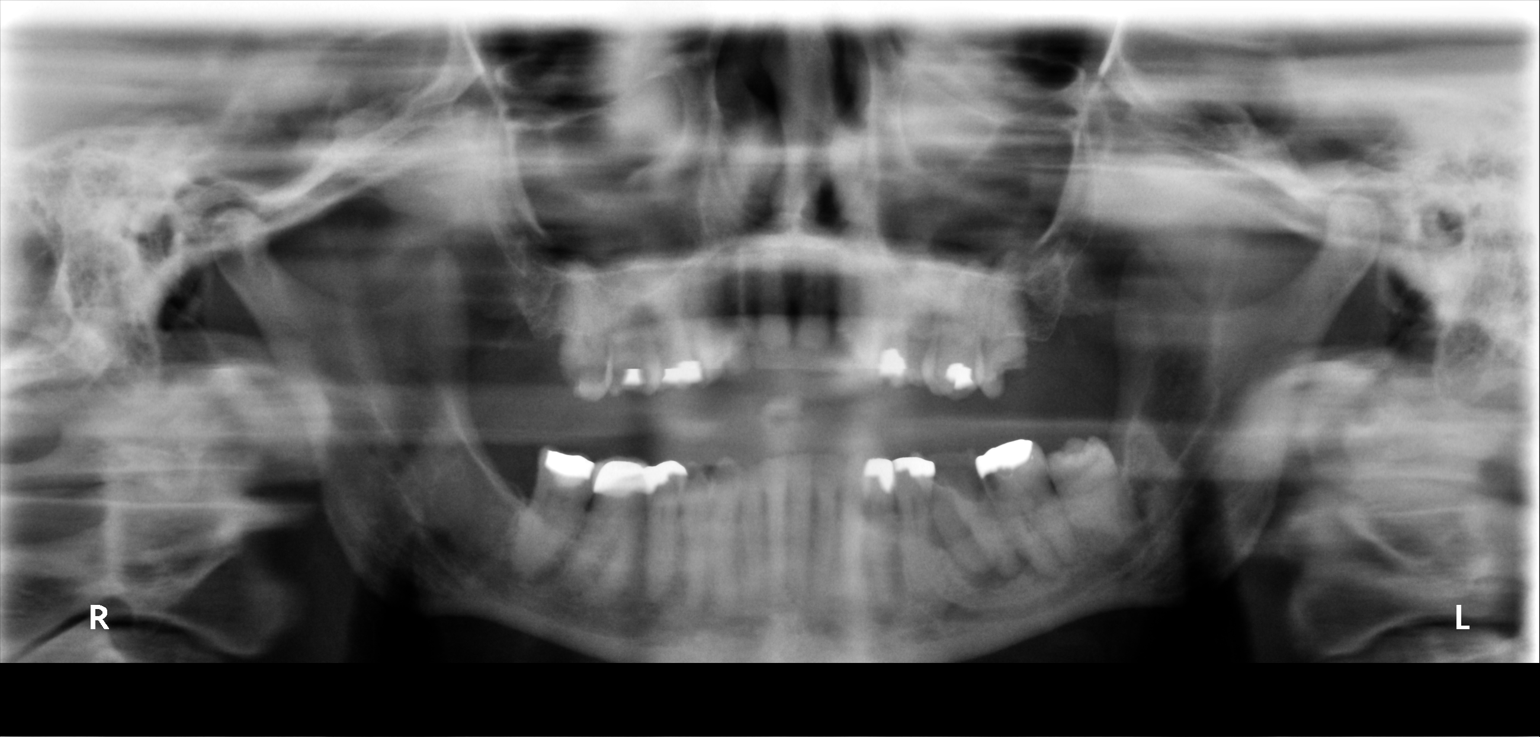

[1 of 1 positions shown; findings below may reference images not displayed]

FINDINGS: Tooth 19 appears to demonstrate significant dental
caries.  There may be periapical  lucency associated with this
tooth as well.  No other definite  dental caries or peridontal
disease is identified on this somewhat motion degraded examination.
The wisdom teeth have been extracted.
IMPRESSION: Tooth 19 appears to demonstrate significant dental caries and
suspected periapical lucency.

## 2013-03-17 DIAGNOSIS — H43819 Vitreous degeneration, unspecified eye: Secondary | ICD-10-CM | POA: Diagnosis not present

## 2013-03-17 DIAGNOSIS — H2510 Age-related nuclear cataract, unspecified eye: Secondary | ICD-10-CM | POA: Diagnosis not present

## 2013-03-17 DIAGNOSIS — E11339 Type 2 diabetes mellitus with moderate nonproliferative diabetic retinopathy without macular edema: Secondary | ICD-10-CM | POA: Diagnosis not present

## 2013-03-17 DIAGNOSIS — E1139 Type 2 diabetes mellitus with other diabetic ophthalmic complication: Secondary | ICD-10-CM | POA: Diagnosis not present

## 2013-03-28 ENCOUNTER — Other Ambulatory Visit: Payer: Self-pay | Admitting: Endocrinology

## 2013-04-05 ENCOUNTER — Other Ambulatory Visit: Payer: Self-pay | Admitting: Endocrinology

## 2013-04-05 ENCOUNTER — Other Ambulatory Visit: Payer: Self-pay | Admitting: Cardiology

## 2013-04-25 ENCOUNTER — Other Ambulatory Visit: Payer: Self-pay | Admitting: Cardiology

## 2013-05-04 ENCOUNTER — Other Ambulatory Visit: Payer: Self-pay | Admitting: Cardiology

## 2013-05-06 ENCOUNTER — Other Ambulatory Visit: Payer: Self-pay | Admitting: Endocrinology

## 2013-05-06 NOTE — Telephone Encounter (Signed)
For Dr Bea LauraE.

## 2013-05-11 ENCOUNTER — Encounter: Payer: Self-pay | Admitting: Endocrinology

## 2013-05-11 ENCOUNTER — Ambulatory Visit (INDEPENDENT_AMBULATORY_CARE_PROVIDER_SITE_OTHER): Payer: Medicare Other | Admitting: Endocrinology

## 2013-05-11 ENCOUNTER — Ambulatory Visit
Admission: RE | Admit: 2013-05-11 | Discharge: 2013-05-11 | Disposition: A | Discharge status: Home / Self Care | Payer: Medicare Other | Source: Ambulatory Visit | Attending: Endocrinology | Admitting: Endocrinology

## 2013-05-11 VITALS — BP 105/54 | HR 98 | Temp 97.8°F | Ht 62.0 in | Wt 282.0 lb

## 2013-05-11 DIAGNOSIS — I10 Essential (primary) hypertension: Secondary | ICD-10-CM

## 2013-05-11 DIAGNOSIS — D509 Iron deficiency anemia, unspecified: Secondary | ICD-10-CM

## 2013-05-11 DIAGNOSIS — E785 Hyperlipidemia, unspecified: Secondary | ICD-10-CM

## 2013-05-11 DIAGNOSIS — R0609 Other forms of dyspnea: Secondary | ICD-10-CM | POA: Diagnosis not present

## 2013-05-11 DIAGNOSIS — R0989 Other specified symptoms and signs involving the circulatory and respiratory systems: Secondary | ICD-10-CM | POA: Diagnosis not present

## 2013-05-11 DIAGNOSIS — R0600 Dyspnea, unspecified: Secondary | ICD-10-CM

## 2013-05-11 DIAGNOSIS — Z79899 Other long term (current) drug therapy: Secondary | ICD-10-CM | POA: Diagnosis not present

## 2013-05-11 DIAGNOSIS — E1065 Type 1 diabetes mellitus with hyperglycemia: Principal | ICD-10-CM

## 2013-05-11 DIAGNOSIS — E1029 Type 1 diabetes mellitus with other diabetic kidney complication: Secondary | ICD-10-CM | POA: Diagnosis not present

## 2013-05-11 DIAGNOSIS — E876 Hypokalemia: Secondary | ICD-10-CM | POA: Diagnosis not present

## 2013-05-11 DIAGNOSIS — E119 Type 2 diabetes mellitus without complications: Secondary | ICD-10-CM | POA: Diagnosis not present

## 2013-05-11 DIAGNOSIS — N289 Disorder of kidney and ureter, unspecified: Secondary | ICD-10-CM

## 2013-05-11 DIAGNOSIS — I5022 Chronic systolic (congestive) heart failure: Secondary | ICD-10-CM

## 2013-05-11 LAB — CBC WITH DIFFERENTIAL/PLATELET
BASOS ABS: 0 10*3/uL (ref 0.0–0.1)
Basophils Relative: 0.3 % (ref 0.0–3.0)
EOS PCT: 2.5 % (ref 0.0–5.0)
Eosinophils Absolute: 0.2 10*3/uL (ref 0.0–0.7)
HCT: 41.8 % (ref 36.0–46.0)
Hemoglobin: 13.5 g/dL (ref 12.0–15.0)
Lymphocytes Relative: 14.4 % (ref 12.0–46.0)
Lymphs Abs: 1.3 10*3/uL (ref 0.7–4.0)
MCHC: 32.3 g/dL (ref 30.0–36.0)
MCV: 94.4 fl (ref 78.0–100.0)
MONOS PCT: 7.5 % (ref 3.0–12.0)
Monocytes Absolute: 0.7 10*3/uL (ref 0.1–1.0)
NEUTROS PCT: 75.3 % (ref 43.0–77.0)
Neutro Abs: 6.8 10*3/uL (ref 1.4–7.7)
PLATELETS: 203 10*3/uL (ref 150.0–400.0)
RBC: 4.43 Mil/uL (ref 3.87–5.11)
RDW: 16.2 % — ABNORMAL HIGH (ref 11.5–14.6)
WBC: 9 10*3/uL (ref 4.5–10.5)

## 2013-05-11 LAB — BASIC METABOLIC PANEL
BUN: 35 mg/dL — AB (ref 6–23)
CALCIUM: 9.6 mg/dL (ref 8.4–10.5)
CO2: 22 mEq/L (ref 19–32)
CREATININE: 1.9 mg/dL — AB (ref 0.4–1.2)
Chloride: 108 mEq/L (ref 96–112)
GFR: 28.66 mL/min — AB (ref >60.00)
Glucose, Bld: 105 mg/dL — ABNORMAL HIGH (ref 70–99)
Potassium: 4.4 mEq/L (ref 3.5–5.1)
Sodium: 141 mEq/L (ref 135–145)

## 2013-05-11 LAB — LIPID PANEL
CHOL/HDL RATIO: 3
CHOLESTEROL: 145 mg/dL (ref 0–200)
HDL: 42.2 mg/dL (ref >39.00)
LDL CALC: 89 mg/dL (ref 0–99)
TRIGLYCERIDES: 70 mg/dL (ref 0.0–149.0)
VLDL: 14 mg/dL (ref 0.0–40.0)

## 2013-05-11 LAB — BRAIN NATRIURETIC PEPTIDE: Pro B Natriuretic peptide (BNP): 781 pg/mL — ABNORMAL HIGH (ref 0.0–100.0)

## 2013-05-11 LAB — IBC PANEL
IRON: 49 ug/dL (ref 42–145)
SATURATION RATIOS: 13.2 % — AB (ref 20.0–50.0)
TRANSFERRIN: 265.7 mg/dL (ref 212.0–360.0)

## 2013-05-11 LAB — HEPATIC FUNCTION PANEL
ALBUMIN: 3.9 g/dL (ref 3.5–5.2)
ALT: 23 U/L (ref 0–35)
AST: 29 U/L (ref 0–37)
Alkaline Phosphatase: 60 U/L (ref 39–117)
Bilirubin, Direct: 0.3 mg/dL (ref 0.0–0.3)
TOTAL PROTEIN: 7.9 g/dL (ref 6.0–8.3)
Total Bilirubin: 1.1 mg/dL (ref 0.3–1.2)

## 2013-05-11 LAB — HEMOGLOBIN A1C: HEMOGLOBIN A1C: 6.2 % (ref 4.6–6.5)

## 2013-05-11 LAB — TSH: TSH: 2.43 u[IU]/mL (ref 0.35–5.50)

## 2013-05-11 MED ORDER — FUROSEMIDE 40 MG PO TABS: 80.0000 mg | ORAL_TABLET | Freq: Two times a day (BID) | ORAL | Status: DC

## 2013-05-11 NOTE — Progress Notes (Signed)
Subjective:    Patient ID: [name], female    DOB: June 20, 1950, 63 y.o.   MRN: [id number]  HPI Pt states few weeks of slight increase in her chronic doe, but no assoc chest pain. She has gained weight, but cbg's are often low in the afternoon. Past Medical History  Diagnosis Date  . CHF (congestive heart failure)     EF 25%  . Cholelithiasis   . Diabetic retinopathy   . Diabetic nephropathy   . Hyperlipemia   . Diabetes mellitus   . Depression   . CAD (coronary artery disease)   . CVA (cerebral vascular accident)   . OSA (obstructive sleep apnea) 01/17/2011    Past Surgical History  Procedure Laterality Date  . Tonsillectomy    . Adenoidectomy    . Coronary artery bypass graft      X 5  . C-sections      x2  . Cholecystectomy    . Wisdom tooth extraction      x3  . Tracheostomy      History   Social History  . Marital Status: Divorced    Spouse Name: N/A    Number of Children: 1  . Years of Education: N/A   Occupational History  . Teacher     retired   Social History Main Topics  . Smoking status: Former Smoker -- 1 years    Types: Cigarettes    Quit date: 09/13/1968  . Smokeless tobacco: Not on file     Comment: occasional  . Alcohol Use: No  . Drug Use: No  . Sexual Activity: Not on file   Other Topics Concern  . Not on file   Social History Narrative  . No narrative on file    Current Outpatient Prescriptions on File Prior to Visit  Medication Sig Dispense Refill  . Ascorbic Acid (VITAMIN C) 500 MG tablet 2 tablets by mouth once daily      . aspirin 81 MG tablet Take 81 mg by mouth daily.        . Calcium-Phosphorus-Vitamin D (CALCIUM GUMMIES PO) Take 1 tablet by mouth daily.        . enalapril (VASOTEC) 10 MG tablet Take one tablet by mouth twice daily  60 tablet  1  . FIBER SELECT GUMMIES PO Take 1 tablet by mouth daily.        . hydrALAZINE (APRESOLINE) 25 MG tablet Take one tablet by mouth twice daily  60 tablet  0  .  isosorbide mononitrate (IMDUR) 60 MG 24 hr tablet Take one tablet by mouth one time daily  30 tablet  0  . Multiple Vitamin (MULTIVITAMIN) tablet Take 1 tablet by mouth daily.        . nitroGLYCERIN (NITROSTAT) 0.4 MG SL tablet Place 1 tablet (0.4 mg total) under the tongue every 5 (five) minutes as needed for chest pain.  25 tablet  11  . oxybutynin (DITROPAN-XL) 10 MG 24 hr tablet Take one tablet by mouth one time daily  30 tablet  9  . ampicillin (PRINCIPEN) 500 MG capsule Take 500 mg by mouth every other day.       . bisoprolol-hydrochlorothiazide (ZIAC) 10-6.25 MG per tablet Take 1 tablet by mouth daily.  90 tablet  0  . Emollient (CERAVE) CREA Apply topically as directed.        . simvastatin (ZOCOR) 40 MG tablet Take 1 tablet (40 mg total) by mouth at bedtime.  30 tablet  1  . traMADol-acetaminophen (ULTRACET) 37.5-325 MG per tablet TAKE ONE TABLET BY MOUTH EVERY FOUR HOURS AS NEEDED FOR PAIN   50 tablet  0  . [DISCONTINUED] oxybutynin (DITROPAN-XL) 10 MG 24 hr tablet Take 1 tablet (10 mg total) by mouth daily.  30 tablet  11   No current facility-administered medications on file prior to visit.    Allergies  Allergen Reactions  . Okra     Family History  Problem Relation Age of Onset  . Hypertension Mother   . Diabetes Mother   . Heart disease Mother   . Heart disease Father     Cardiovascular accident    BP 105/54  Pulse 98  Temp(Src) 97.8 F (36.6 C) (Oral)  Ht 5\' 2"  (1.575 m)  Wt 282 lb (127.914 kg)  BMI 51.57 kg/m2  SpO2 90%   Review of Systems Denies cough and fever.      Objective:   Physical Exam VITAL SIGNS:  See vs page GENERAL: no distress LUNGS:  Clear to auscultation, except for rales at the left base.      Assessment & Plan:  DM: apparently overcontrolled Sob: prob due to CHF, worse Renal insufficiency: this can limit the rx of CHF.

## 2013-05-11 NOTE — Patient Instructions (Addendum)
Please come back for a follow-up appointment in 3 months.   check your blood sugar twice a day.  vary the time of day when you check, between before the 3 meals, and at bedtime.  also check if you have symptoms of your blood sugar being too high or too low.  please keep a record of the readings and bring it to your next appointment here.  You can write it on any piece of paper.  please call us sooner if your blood sugar goes below 70, or if you have a lot of readings over 200.   blood tests are being requested for you today.  We'll contact you with results.   Also, please do a chest-x-ray, downstairs.   Please go back to see dr hochrein.  you will receive a phone call, about a day and time for an appointment.   Please increase the furosemide to 2x 40 mg, twice a day, for now.  i have sent a prescription to your pharmacy. Please reduce th insulin to 20 units, twice a day.   Please come back for a follow-up appointment in 2 days.

## 2013-05-13 ENCOUNTER — Encounter: Payer: Self-pay | Admitting: Endocrinology

## 2013-05-13 ENCOUNTER — Ambulatory Visit (INDEPENDENT_AMBULATORY_CARE_PROVIDER_SITE_OTHER): Payer: Medicare Other | Admitting: Endocrinology

## 2013-05-13 VITALS — BP 114/43 | HR 93 | Temp 97.6°F | Ht 62.0 in | Wt 279.0 lb

## 2013-05-13 DIAGNOSIS — E1029 Type 1 diabetes mellitus with other diabetic kidney complication: Secondary | ICD-10-CM | POA: Diagnosis not present

## 2013-05-13 DIAGNOSIS — I10 Essential (primary) hypertension: Secondary | ICD-10-CM

## 2013-05-13 DIAGNOSIS — I5022 Chronic systolic (congestive) heart failure: Secondary | ICD-10-CM

## 2013-05-13 DIAGNOSIS — E1065 Type 1 diabetes mellitus with hyperglycemia: Secondary | ICD-10-CM

## 2013-05-13 LAB — MICROALBUMIN / CREATININE URINE RATIO
Creatinine,U: 93.7 mg/dL
MICROALB UR: 20.6 mg/dL — AB (ref 0.0–1.9)
Microalb Creat Ratio: 22 mg/g (ref 0.0–30.0)

## 2013-05-13 MED ORDER — BUDESONIDE-FORMOTEROL FUMARATE 80-4.5 MCG/ACT IN AERO: 2.0000 | INHALATION_SPRAY | Freq: Two times a day (BID) | RESPIRATORY_TRACT | Status: DC

## 2013-05-13 NOTE — Progress Notes (Signed)
Subjective:    Patient ID: [name], female    DOB: 1950-12-21, 63 y.o.   MRN: [id number]  HPI Pt returns for f/u of CHF: She has 1 week of moderate worsening.  She says edema is much less now, and is only slight.  Associated DOE is only slightly better.   Past Medical History  Diagnosis Date  . CHF (congestive heart failure)     EF 25%  . Cholelithiasis   . Diabetic retinopathy   . Diabetic nephropathy   . Hyperlipemia   . Diabetes mellitus   . Depression   . CAD (coronary artery disease)   . CVA (cerebral vascular accident)   . OSA (obstructive sleep apnea) 01/17/2011    Past Surgical History  Procedure Laterality Date  . Tonsillectomy    . Adenoidectomy    . Coronary artery bypass graft      X 5  . C-sections      x2  . Cholecystectomy    . Wisdom tooth extraction      x3  . Tracheostomy      History   Social History  . Marital Status: Divorced    Spouse Name: N/A    Number of Children: 1  . Years of Education: N/A   Occupational History  . Teacher     retired   Social History Main Topics  . Smoking status: Former Smoker -- 1 years    Types: Cigarettes    Quit date: 09/13/1968  . Smokeless tobacco: Not on file     Comment: occasional  . Alcohol Use: No  . Drug Use: No  . Sexual Activity: Not on file   Other Topics Concern  . Not on file   Social History Narrative  . No narrative on file    Current Outpatient Prescriptions on File Prior to Visit  Medication Sig Dispense Refill  . ampicillin (PRINCIPEN) 500 MG capsule Take 500 mg by mouth every other day.       . Ascorbic Acid (VITAMIN C) 500 MG tablet 2 tablets by mouth once daily      . aspirin 81 MG tablet Take 81 mg by mouth daily.        . bisoprolol-hydrochlorothiazide (ZIAC) 10-6.25 MG per tablet Take 1 tablet by mouth daily.  90 tablet  0  . Calcium-Phosphorus-Vitamin D (CALCIUM GUMMIES PO) Take 1 tablet by mouth daily.        . Emollient (CERAVE) CREA Apply topically as  directed.        . enalapril (VASOTEC) 10 MG tablet Take one tablet by mouth twice daily  60 tablet  1  . FIBER SELECT GUMMIES PO Take 1 tablet by mouth daily.        . furosemide (LASIX) 40 MG tablet Take 2 tablets (80 mg total) by mouth 2 (two) times daily.  120 tablet  3  . hydrALAZINE (APRESOLINE) 25 MG tablet Take one tablet by mouth twice daily  60 tablet  0  . insulin NPH-regular Human (NOVOLIN 70/30) (70-30) 100 UNIT/ML injection Inject 20 Units into the skin 2 (two) times daily with a meal.      . isosorbide mononitrate (IMDUR) 60 MG 24 hr tablet Take one tablet by mouth one time daily  30 tablet  0  . Multiple Vitamin (MULTIVITAMIN) tablet Take 1 tablet by mouth daily.        . nitroGLYCERIN (NITROSTAT) 0.4 MG SL tablet Place 1 tablet (0.4 mg total) under the  tongue every 5 (five) minutes as needed for chest pain.  25 tablet  11  . oxybutynin (DITROPAN-XL) 10 MG 24 hr tablet Take one tablet by mouth one time daily  30 tablet  9  . simvastatin (ZOCOR) 40 MG tablet Take 1 tablet (40 mg total) by mouth at bedtime.  30 tablet  1  . traMADol-acetaminophen (ULTRACET) 37.5-325 MG per tablet TAKE ONE TABLET BY MOUTH EVERY FOUR HOURS AS NEEDED FOR PAIN   50 tablet  0  . [DISCONTINUED] oxybutynin (DITROPAN-XL) 10 MG 24 hr tablet Take 1 tablet (10 mg total) by mouth daily.  30 tablet  11   No current facility-administered medications on file prior to visit.    Allergies  Allergen Reactions  . Okra     Family History  Problem Relation Age of Onset  . Hypertension Mother   . Diabetes Mother   . Heart disease Mother   . Heart disease Father     Cardiovascular accident    BP 114/43  Pulse 93  Temp(Src) 97.6 F (36.4 C) (Oral)  Ht 5\' 2"  (1.575 m)  Wt 279 lb (126.554 kg)  BMI 51.02 kg/m2  SpO2 93%    Review of Systems Denies chest pain.    Objective:   Physical Exam VITAL SIGNS:  See vs page GENERAL: no distress   Lab Results  Component Value Date   WBC 9.0 05/11/2013    HGB 13.5 05/11/2013   HCT 41.8 05/11/2013   PLT 203.0 05/11/2013   GLUCOSE 105* 05/11/2013   CHOL 145 05/11/2013   TRIG 70.0 05/11/2013   HDL 42.20 05/11/2013   LDLDIRECT 100.9 02/03/2008   LDLCALC 89 05/11/2013   ALT 23 05/11/2013   AST 29 05/11/2013   NA 141 05/11/2013   K 4.4 05/11/2013   CL 108 05/11/2013   CREATININE 1.9* 05/11/2013   BUN 35* 05/11/2013   CO2 22 05/11/2013   TSH 2.43 05/11/2013   INR 1.62* 01/19/2011   HGBA1C 6.2 05/11/2013   MICROALBUR 20.6* 05/13/2013  BNP=elevated    Assessment & Plan:  CHF: improved. Renal failure: stable. Dyslipidemia: well-controlled

## 2013-05-13 NOTE — Patient Instructions (Addendum)
i have sent a prescription to your pharmacy, for an inhaler.   Otherwise, please continue the same medications.   Please come back for a follow-up appointment in 2 days.

## 2013-05-15 ENCOUNTER — Encounter: Payer: Self-pay | Admitting: Endocrinology

## 2013-05-15 ENCOUNTER — Ambulatory Visit (INDEPENDENT_AMBULATORY_CARE_PROVIDER_SITE_OTHER): Payer: Medicare Other | Admitting: Endocrinology

## 2013-05-15 VITALS — BP 133/47 | HR 93 | Temp 97.8°F | Ht 62.0 in | Wt 280.0 lb

## 2013-05-15 DIAGNOSIS — R0989 Other specified symptoms and signs involving the circulatory and respiratory systems: Secondary | ICD-10-CM

## 2013-05-15 DIAGNOSIS — N289 Disorder of kidney and ureter, unspecified: Secondary | ICD-10-CM | POA: Diagnosis not present

## 2013-05-15 DIAGNOSIS — R0609 Other forms of dyspnea: Secondary | ICD-10-CM

## 2013-05-15 DIAGNOSIS — R0600 Dyspnea, unspecified: Secondary | ICD-10-CM

## 2013-05-15 DIAGNOSIS — Z0279 Encounter for issue of other medical certificate: Secondary | ICD-10-CM

## 2013-05-15 LAB — BASIC METABOLIC PANEL
BUN: 45 mg/dL — ABNORMAL HIGH (ref 6–23)
CALCIUM: 9.5 mg/dL (ref 8.4–10.5)
CO2: 25 mEq/L (ref 19–32)
Chloride: 107 mEq/L (ref 96–112)
Creatinine, Ser: 2 mg/dL — ABNORMAL HIGH (ref 0.4–1.2)
GFR: 27 mL/min — AB (ref >60.00)
Glucose, Bld: 99 mg/dL (ref 70–99)
Potassium: 4.4 mEq/L (ref 3.5–5.1)
SODIUM: 138 meq/L (ref 135–145)

## 2013-05-15 LAB — BRAIN NATRIURETIC PEPTIDE: Pro B Natriuretic peptide (BNP): 465 pg/mL — ABNORMAL HIGH (ref 0.0–100.0)

## 2013-05-15 MED ORDER — METOLAZONE 2.5 MG PO TABS: 2.5000 mg | ORAL_TABLET | Freq: Two times a day (BID) | ORAL | Status: DC

## 2013-05-15 MED ORDER — METOPROLOL SUCCINATE ER 25 MG PO TB24: 25.0000 mg | ORAL_TABLET | Freq: Every day | ORAL | Status: DC

## 2013-05-15 MED ORDER — ALBUTEROL SULFATE HFA 108 (90 BASE) MCG/ACT IN AERS: 2.0000 | INHALATION_SPRAY | Freq: Four times a day (QID) | RESPIRATORY_TRACT | Status: DC | PRN

## 2013-05-15 NOTE — Progress Notes (Signed)
Subjective:    Patient ID: [name], female    DOB: January 19, 1951, 63 y.o.   MRN: [id number]  HPI The state of at least three ongoing medical problems is addressed today, with interval history of each noted here: CHF: Doe persists.  Only slightly better.  She did not get MDI rx filled, due to cost.  She is due to see cardiol on 05/28/13.  Renal insufficiency: Edema is improved DM: no cbg record, but states cbg's are "70."  She denies hypoglycemia. Past Medical History  Diagnosis Date  . CHF (congestive heart failure)     EF 25%  . Cholelithiasis   . Diabetic retinopathy   . Diabetic nephropathy   . Hyperlipemia   . Diabetes mellitus   . Depression   . CAD (coronary artery disease)   . CVA (cerebral vascular accident)   . OSA (obstructive sleep apnea) 01/17/2011    Past Surgical History  Procedure Laterality Date  . Tonsillectomy    . Adenoidectomy    . Coronary artery bypass graft      X 5  . C-sections      x2  . Cholecystectomy    . Wisdom tooth extraction      x3  . Tracheostomy      History   Social History  . Marital Status: Divorced    Spouse Name: N/A    Number of Children: 1  . Years of Education: N/A   Occupational History  . Teacher     retired   Social History Main Topics  . Smoking status: Former Smoker -- 1 years    Types: Cigarettes    Quit date: 09/13/1968  . Smokeless tobacco: Not on file     Comment: occasional  . Alcohol Use: No  . Drug Use: No  . Sexual Activity: Not on file   Other Topics Concern  . Not on file   Social History Narrative  . No narrative on file    Current Outpatient Prescriptions on File Prior to Visit  Medication Sig Dispense Refill  . ampicillin (PRINCIPEN) 500 MG capsule Take 500 mg by mouth every other day.       . Ascorbic Acid (VITAMIN C) 500 MG tablet 2 tablets by mouth once daily      . aspirin 81 MG tablet Take 81 mg by mouth daily.        . Calcium-Phosphorus-Vitamin D (CALCIUM GUMMIES  PO) Take 1 tablet by mouth daily.        . Emollient (CERAVE) CREA Apply topically as directed.        . enalapril (VASOTEC) 10 MG tablet Take one tablet by mouth twice daily  60 tablet  1  . FIBER SELECT GUMMIES PO Take 1 tablet by mouth daily.        . furosemide (LASIX) 40 MG tablet Take 2 tablets (80 mg total) by mouth 2 (two) times daily.  120 tablet  3  . hydrALAZINE (APRESOLINE) 25 MG tablet Take one tablet by mouth twice daily  60 tablet  0  . insulin NPH-regular Human (NOVOLIN 70/30) (70-30) 100 UNIT/ML injection Inject 15 Units into the skin 2 (two) times daily with a meal.       . isosorbide mononitrate (IMDUR) 60 MG 24 hr tablet Take one tablet by mouth one time daily  30 tablet  0  . Multiple Vitamin (MULTIVITAMIN) tablet Take 1 tablet by mouth daily.        . nitroGLYCERIN (  NITROSTAT) 0.4 MG SL tablet Place 1 tablet (0.4 mg total) under the tongue every 5 (five) minutes as needed for chest pain.  25 tablet  11  . oxybutynin (DITROPAN-XL) 10 MG 24 hr tablet Take one tablet by mouth one time daily  30 tablet  9  . simvastatin (ZOCOR) 40 MG tablet Take 1 tablet (40 mg total) by mouth at bedtime.  30 tablet  1  . traMADol-acetaminophen (ULTRACET) 37.5-325 MG per tablet TAKE ONE TABLET BY MOUTH EVERY FOUR HOURS AS NEEDED FOR PAIN   50 tablet  0  . [DISCONTINUED] oxybutynin (DITROPAN-XL) 10 MG 24 hr tablet Take 1 tablet (10 mg total) by mouth daily.  30 tablet  11   No current facility-administered medications on file prior to visit.    Allergies  Allergen Reactions  . Okra     Family History  Problem Relation Age of Onset  . Hypertension Mother   . Diabetes Mother   . Heart disease Mother   . Heart disease Father     Cardiovascular accident    BP 133/47  Pulse 93  Temp(Src) 97.8 F (36.6 C) (Oral)  Ht 5\' 2"  (1.575 m)  Wt 280 lb (127.007 kg)  BMI 51.20 kg/m2  SpO2 90%   Review of Systems Denies LOC and weight change    Objective:   Physical Exam VITAL SIGNS:   See vs page GENERAL: no distress.  Morbid obesity LUNGS:  Clear to auscultation, except for a few rales at the bases.        Assessment & Plan:  CHF: slow improvement.  She needs increased rx Renal insufficiency: she'll need close monitoring of this, in view of increased diuretic rx. DM: still overcontrolled.

## 2013-05-15 NOTE — Patient Instructions (Addendum)
blood tests are being requested for you today.  We'll contact you with results. i have sent a prescription to your pharmacy, for a different inhaler.  Please reduce the insulin to 15 units, twice a day (with first and last meals of the day) Please change bisoprolol/hctz to metoprolol.  i have sent a prescription to your pharmacy Please add metolozone (helper water pill).  i have sent a prescription to your pharmacy Please come back for a follow-up appointment in 3 days.

## 2013-05-18 ENCOUNTER — Ambulatory Visit: Payer: Medicare Other | Admitting: Endocrinology

## 2013-05-20 ENCOUNTER — Encounter: Payer: Self-pay | Admitting: Endocrinology

## 2013-05-20 ENCOUNTER — Ambulatory Visit (INDEPENDENT_AMBULATORY_CARE_PROVIDER_SITE_OTHER): Payer: Medicare Other | Admitting: Endocrinology

## 2013-05-20 VITALS — BP 84/56 | HR 76 | Temp 97.6°F | Ht 62.0 in | Wt 270.0 lb

## 2013-05-20 DIAGNOSIS — N289 Disorder of kidney and ureter, unspecified: Secondary | ICD-10-CM | POA: Diagnosis not present

## 2013-05-20 LAB — BASIC METABOLIC PANEL
BUN: 47 mg/dL — ABNORMAL HIGH (ref 6–23)
CALCIUM: 9.7 mg/dL (ref 8.4–10.5)
CO2: 31 mEq/L (ref 19–32)
Chloride: 98 mEq/L (ref 96–112)
Creatinine, Ser: 2 mg/dL — ABNORMAL HIGH (ref 0.4–1.2)
GFR: 26.08 mL/min — AB (ref >60.00)
Glucose, Bld: 96 mg/dL (ref 70–99)
POTASSIUM: 4 meq/L (ref 3.5–5.1)
SODIUM: 139 meq/L (ref 135–145)

## 2013-05-20 NOTE — Progress Notes (Signed)
Subjective:    Patient ID: [name], female    DOB: April 24, 1950, 63 y.o.   MRN: [id number]  HPI The state of at least three ongoing medical problems is addressed today, with interval history of each noted here: CHF: [name] is much better.  She did not get MDI rx filled, due to cost.  She is due to see cardiol on 05/28/13.  Renal insufficiency: Edema is much better. DM: she brings a record of her cbg's which i have reviewed today.  It varies from 79-136.  It is in general higher as the day goes on.  She denies hypoglycemia.   Past Medical History  Diagnosis Date  . CHF (congestive heart failure)     EF 25%  . Cholelithiasis   . Diabetic retinopathy   . Diabetic nephropathy   . Hyperlipemia   . Diabetes mellitus   . Depression   . CAD (coronary artery disease)   . CVA (cerebral vascular accident)   . OSA (obstructive sleep apnea) 01/17/2011    Past Surgical History  Procedure Laterality Date  . Tonsillectomy    . Adenoidectomy    . Coronary artery bypass graft      X 5  . C-sections      x2  . Cholecystectomy    . Wisdom tooth extraction      x3  . Tracheostomy      History   Social History  . Marital Status: Divorced    Spouse Name: N/A    Number of Children: 1  . Years of Education: N/A   Occupational History  . Teacher     retired   Social History Main Topics  . Smoking status: Former Smoker -- 1 years    Types: Cigarettes    Quit date: 09/13/1968  . Smokeless tobacco: Not on file     Comment: occasional  . Alcohol Use: No  . Drug Use: No  . Sexual Activity: Not on file   Other Topics Concern  . Not on file   Social History Narrative  . No narrative on file    Current Outpatient Prescriptions on File Prior to Visit  Medication Sig Dispense Refill  . albuterol (PROVENTIL HFA;VENTOLIN HFA) 108 (90 BASE) MCG/ACT inhaler Inhale 2 puffs into the lungs every 6 (six) hours as needed for wheezing or shortness of breath.  1 Inhaler  0  .  ampicillin (PRINCIPEN) 500 MG capsule Take 500 mg by mouth every other day.       . Ascorbic Acid (VITAMIN C) 500 MG tablet 2 tablets by mouth once daily      . aspirin 81 MG tablet Take 81 mg by mouth daily.        . Calcium-Phosphorus-Vitamin D (CALCIUM GUMMIES PO) Take 1 tablet by mouth daily.        . Emollient (CERAVE) CREA Apply topically as directed.        . enalapril (VASOTEC) 10 MG tablet Take one tablet by mouth twice daily  60 tablet  1  . FIBER SELECT GUMMIES PO Take 1 tablet by mouth daily.        . furosemide (LASIX) 40 MG tablet Take 2 tablets (80 mg total) by mouth 2 (two) times daily.  120 tablet  3  . insulin NPH-regular Human (NOVOLIN 70/30) (70-30) 100 UNIT/ML injection 15 units with breakfast, and 10 units with the evening meal.      . metolazone (ZAROXOLYN) 2.5 MG tablet Take 1 tablet (2.5  mg total) by mouth 2 (two) times daily.  60 tablet  1  . metoprolol succinate (TOPROL-XL) 25 MG 24 hr tablet Take 1 tablet (25 mg total) by mouth daily.  90 tablet  3  . Multiple Vitamin (MULTIVITAMIN) tablet Take 1 tablet by mouth daily.        . nitroGLYCERIN (NITROSTAT) 0.4 MG SL tablet Place 1 tablet (0.4 mg total) under the tongue every 5 (five) minutes as needed for chest pain.  25 tablet  11  . oxybutynin (DITROPAN-XL) 10 MG 24 hr tablet Take one tablet by mouth one time daily  30 tablet  9  . simvastatin (ZOCOR) 40 MG tablet Take 1 tablet (40 mg total) by mouth at bedtime.  30 tablet  1  . traMADol-acetaminophen (ULTRACET) 37.5-325 MG per tablet TAKE ONE TABLET BY MOUTH EVERY FOUR HOURS AS NEEDED FOR PAIN   50 tablet  0  . [DISCONTINUED] oxybutynin (DITROPAN-XL) 10 MG 24 hr tablet Take 1 tablet (10 mg total) by mouth daily.  30 tablet  11   No current facility-administered medications on file prior to visit.    Allergies  Allergen Reactions  . Okra     Family History  Problem Relation Age of Onset  . Hypertension Mother   . Diabetes Mother   . Heart disease Mother   .  Heart disease Father     Cardiovascular accident    BP 84/56  Pulse 76  Temp(Src) 97.6 F (36.4 C) (Oral)  Ht 5\' 2"  (1.575 m)  Wt 270 lb (122.471 kg)  BMI 49.37 kg/m2  SpO2 91%   Review of Systems She has lost weight.  Denies cough and dizziness.    Objective:   Physical Exam VITAL SIGNS:  See vs page GENERAL: no distress LUNGS:  Clear to auscultation, except for a few rales at the left base.     Lab Results  Component Value Date   CREATININE 2.0* 05/20/2013   BUN 47* 05/20/2013   NA 139 05/20/2013   K 4.0 05/20/2013   CL 98 05/20/2013   CO2 31 05/20/2013       Assessment & Plan:  Asymptomatic hypotension, new Hypokalemia: well-controlled CHF: much better DM: still overcontrolled. Renal insufficiency, stable.

## 2013-05-20 NOTE — Patient Instructions (Addendum)
blood tests are being requested for you today.  We'll contact you with results. Please see Dr Antoine PocheHochrein as scheduled next week.   Please reduce the insulin to 15 units with breakfast, and 10 units with the evening meal.  Please come back for a follow-up appointment in 2 days. Please stop taking the isosorbide and hydralazine.

## 2013-05-22 ENCOUNTER — Encounter: Payer: Self-pay | Admitting: Endocrinology

## 2013-05-22 ENCOUNTER — Ambulatory Visit (INDEPENDENT_AMBULATORY_CARE_PROVIDER_SITE_OTHER): Payer: Medicare Other | Admitting: Endocrinology

## 2013-05-22 VITALS — BP 110/70 | HR 75 | Temp 98.2°F | Ht 62.0 in | Wt 267.0 lb

## 2013-05-22 DIAGNOSIS — I5022 Chronic systolic (congestive) heart failure: Secondary | ICD-10-CM

## 2013-05-22 NOTE — Progress Notes (Signed)
Subjective:    Patient ID: [name], female    DOB: 11/08/1950, 63 y.o.   MRN: [id number]  HPI Pt was noted here 2 days ago to have hypotension:  Off the nitrate and hydralazine, she denies dizziness.  Also, she denies sob. Past Medical History  Diagnosis Date  . CHF (congestive heart failure)     EF 25%  . Cholelithiasis   . Diabetic retinopathy   . Diabetic nephropathy   . Hyperlipemia   . Diabetes mellitus   . Depression   . CAD (coronary artery disease)   . CVA (cerebral vascular accident)   . OSA (obstructive sleep apnea) 01/17/2011    Past Surgical History  Procedure Laterality Date  . Tonsillectomy    . Adenoidectomy    . Coronary artery bypass graft      X 5  . C-sections      x2  . Cholecystectomy    . Wisdom tooth extraction      x3  . Tracheostomy      History   Social History  . Marital Status: Divorced    Spouse Name: N/A    Number of Children: 1  . Years of Education: N/A   Occupational History  . Teacher     retired   Social History Main Topics  . Smoking status: Former Smoker -- 1 years    Types: Cigarettes    Quit date: 09/13/1968  . Smokeless tobacco: Not on file     Comment: occasional  . Alcohol Use: No  . Drug Use: No  . Sexual Activity: Not on file   Other Topics Concern  . Not on file   Social History Narrative  . No narrative on file    Current Outpatient Prescriptions on File Prior to Visit  Medication Sig Dispense Refill  . albuterol (PROVENTIL HFA;VENTOLIN HFA) 108 (90 BASE) MCG/ACT inhaler Inhale 2 puffs into the lungs every 6 (six) hours as needed for wheezing or shortness of breath.  1 Inhaler  0  . ampicillin (PRINCIPEN) 500 MG capsule Take 500 mg by mouth every other day.       . Ascorbic Acid (VITAMIN C) 500 MG tablet 2 tablets by mouth once daily      . aspirin 81 MG tablet Take 81 mg by mouth daily.        . Calcium-Phosphorus-Vitamin D (CALCIUM GUMMIES PO) Take 1 tablet by mouth daily.        .  Emollient (CERAVE) CREA Apply topically as directed.        . enalapril (VASOTEC) 10 MG tablet Take one tablet by mouth twice daily  60 tablet  1  . FIBER SELECT GUMMIES PO Take 1 tablet by mouth daily.        . furosemide (LASIX) 40 MG tablet Take 2 tablets (80 mg total) by mouth 2 (two) times daily.  120 tablet  3  . insulin NPH-regular Human (NOVOLIN 70/30) (70-30) 100 UNIT/ML injection 15 units with breakfast, and 10 units with the evening meal.      . metolazone (ZAROXOLYN) 2.5 MG tablet Take 1 tablet (2.5 mg total) by mouth 2 (two) times daily.  60 tablet  1  . metoprolol succinate (TOPROL-XL) 25 MG 24 hr tablet Take 1 tablet (25 mg total) by mouth daily.  90 tablet  3  . Multiple Vitamin (MULTIVITAMIN) tablet Take 1 tablet by mouth daily.        . nitroGLYCERIN (NITROSTAT) 0.4 MG SL  tablet Place 1 tablet (0.4 mg total) under the tongue every 5 (five) minutes as needed for chest pain.  25 tablet  11  . oxybutynin (DITROPAN-XL) 10 MG 24 hr tablet Take one tablet by mouth one time daily  30 tablet  9  . simvastatin (ZOCOR) 40 MG tablet Take 1 tablet (40 mg total) by mouth at bedtime.  30 tablet  1  . SYMBICORT 80-4.5 MCG/ACT inhaler       . traMADol-acetaminophen (ULTRACET) 37.5-325 MG per tablet TAKE ONE TABLET BY MOUTH EVERY FOUR HOURS AS NEEDED FOR PAIN   50 tablet  0  . [DISCONTINUED] oxybutynin (DITROPAN-XL) 10 MG 24 hr tablet Take 1 tablet (10 mg total) by mouth daily.  30 tablet  11   No current facility-administered medications on file prior to visit.    Allergies  Allergen Reactions  . Okra     Family History  Problem Relation Age of Onset  . Hypertension Mother   . Diabetes Mother   . Heart disease Mother   . Heart disease Father     Cardiovascular accident    BP 110/70  Pulse 75  Temp(Src) 98.2 F (36.8 C) (Oral)  Ht 5\' 2"  (1.575 m)  Wt 267 lb (121.11 kg)  BMI 48.82 kg/m2  SpO2 91%  Review of Systems Denies LOC    Objective:   Physical Exam VITAL SIGNS:   See vs page GENERAL: no distress LUNGS:  Clear to auscultation, except for a few rales at the left base.     Assessment & Plan:  Hypotension, better CHF, better

## 2013-05-22 NOTE — Patient Instructions (Signed)
Please see Dr Antoine PocheHochrein as scheduled next week.   Please come back for a follow-up appointment in 3 months. Please stay off the the isosorbide and hydralazine for now.

## 2013-05-28 ENCOUNTER — Encounter: Payer: Self-pay | Admitting: Cardiology

## 2013-05-28 ENCOUNTER — Ambulatory Visit (INDEPENDENT_AMBULATORY_CARE_PROVIDER_SITE_OTHER): Payer: Medicare Other | Admitting: Cardiology

## 2013-05-28 VITALS — BP 96/46 | HR 76 | Ht 62.0 in | Wt 249.0 lb

## 2013-05-28 DIAGNOSIS — I10 Essential (primary) hypertension: Secondary | ICD-10-CM | POA: Diagnosis not present

## 2013-05-28 DIAGNOSIS — I5022 Chronic systolic (congestive) heart failure: Secondary | ICD-10-CM | POA: Diagnosis not present

## 2013-05-28 NOTE — Patient Instructions (Signed)
The current medical regimen is effective;  continue present plan and medications.  Your physician has requested that you have an echocardiogram. Echocardiography is a painless test that uses sound waves to create images of your heart. It provides your doctor with information about the size and shape of your heart and how well your heart's chambers and valves are working. This procedure takes approximately one hour. There are no restrictions for this procedure.  Follow up in 1 month with Dr Antoine PocheHochrein.

## 2013-05-28 NOTE — Progress Notes (Signed)
HPI:  The patient presents for followup of CAD and ischemic cardiomyopathy. She presented with weight gain and dyspnea.   She has been followed very closely by Romero BellingELLISON, SEAN, MD  and has had very careful adjustment of her diuretics with about a 20 pound weight loss since then. With this her chronic renal insufficiency has been stable with her last creatinine being 2.   She denies any chest pressure, neck or arm discomfort. She's not had any new syncope although she's had some presyncope with orthostatic symptoms. She had a slow increase in her dyspnea over a month with a weight gain area of she's had progressive fatigue and dyspnea with any activity.   Allergies  Allergen Reactions  . Okra     Current Outpatient Prescriptions  Medication Sig Dispense Refill  . albuterol (PROVENTIL HFA;VENTOLIN HFA) 108 (90 BASE) MCG/ACT inhaler Inhale 2 puffs into the lungs every 6 (six) hours as needed for wheezing or shortness of breath.  1 Inhaler  0  . Ascorbic Acid (VITAMIN C) 500 MG tablet 2 tablets by mouth once daily      . aspirin 81 MG tablet Take 81 mg by mouth daily.        . Calcium-Phosphorus-Vitamin D (CALCIUM GUMMIES PO) Take 1 tablet by mouth daily.        . Emollient (CERAVE) CREA Apply topically as directed.        . enalapril (VASOTEC) 10 MG tablet Take one tablet by mouth twice daily  60 tablet  1  . FIBER SELECT GUMMIES PO Take 1 tablet by mouth daily.        . furosemide (LASIX) 40 MG tablet Take 2 tablets (80 mg total) by mouth 2 (two) times daily.  120 tablet  3  . insulin NPH-regular Human (NOVOLIN 70/30) (70-30) 100 UNIT/ML injection 15 units with breakfast, and 10 units with the evening meal.      . metolazone (ZAROXOLYN) 2.5 MG tablet Take 1 tablet (2.5 mg total) by mouth 2 (two) times daily.  60 tablet  1  . metoprolol succinate (TOPROL-XL) 25 MG 24 hr tablet Take 1 tablet (25 mg total) by mouth daily.  90 tablet  3  . Multiple Vitamin (MULTIVITAMIN) tablet Take 1 tablet by  mouth daily.        . nitroGLYCERIN (NITROSTAT) 0.4 MG SL tablet Place 1 tablet (0.4 mg total) under the tongue every 5 (five) minutes as needed for chest pain.  25 tablet  11  . oxybutynin (DITROPAN-XL) 10 MG 24 hr tablet Take one tablet by mouth one time daily  30 tablet  9  . simvastatin (ZOCOR) 40 MG tablet Take 1 tablet (40 mg total) by mouth at bedtime.  30 tablet  1  . traMADol-acetaminophen (ULTRACET) 37.5-325 MG per tablet TAKE ONE TABLET BY MOUTH EVERY FOUR HOURS AS NEEDED FOR PAIN   50 tablet  0  . [DISCONTINUED] oxybutynin (DITROPAN-XL) 10 MG 24 hr tablet Take 1 tablet (10 mg total) by mouth daily.  30 tablet  11   No current facility-administered medications for this visit.    Past Medical History  Diagnosis Date  . CHF (congestive heart failure)     EF 25%  . Cholelithiasis   . Diabetic retinopathy   . Diabetic nephropathy   . Hyperlipemia   . Diabetes mellitus   . Depression   . CAD (coronary artery disease)   . CVA (cerebral vascular accident)   . OSA (obstructive sleep apnea)  01/17/2011    Past Surgical History  Procedure Laterality Date  . Tonsillectomy    . Adenoidectomy    . Coronary artery bypass graft      X 5  . C-sections      x2  . Cholecystectomy    . Wisdom tooth extraction      x3  . Tracheostomy      ROS:  As stated in the HPI and negative for all other systems.  PHYSICAL EXAM BP 96/46  Pulse 76  Ht 5\' 2"  (1.575 m)  Wt 249 lb (112.946 kg)  BMI 45.53 kg/m2 GENERAL:  Well appearing NECK:  No jugular venous distention, waveform within normal limits, carotid upstroke brisk and symmetric, no bruits, no thyromegaly LYMPHATICS:  No cervical, inguinal adenopathy LUNGS:  Clear to auscultation bilaterally BACK:  No CVA tenderness CHEST:  Unremarkable HEART:  PMI not displaced or sustained,S1 and S2 within normal limits, no S3, no S4, no clicks, no rubs, no murmurs ABD:  Flat, positive bowel sounds normal in frequency in pitch, no bruits, no  rebound, no guarding, no midline pulsatile mass, no hepatomegaly, no splenomegaly EXT:  2 plus pulses throughout, no edema, no cyanosis no clubbing   ASSESSMENT AND PLAN  CAD - The patient has no new sypmtoms.  No further cardiovascular testing is indicated.  We will continue with aggressive risk reduction and meds as listed.  CHRONIC SYSTOLIC HEART FAILURE - I will check an echo as it has been two years.   We did discuss when necessary dosing of her diuretic and daily weights. Otherwise I will not change her meds.  ESSENTIAL HYPERTENSION, BENIGN -  This is being managed in the context of treating his CHF   Renal insufficiency -  This was followed expertly by Dr. Everardo All.  She will continue the current therapy.

## 2013-06-07 ENCOUNTER — Other Ambulatory Visit: Payer: Self-pay | Admitting: Cardiology

## 2013-06-10 ENCOUNTER — Other Ambulatory Visit (HOSPITAL_COMMUNITY): Payer: Medicare Other

## 2013-06-10 ENCOUNTER — Other Ambulatory Visit: Payer: Self-pay | Admitting: Endocrinology

## 2013-07-01 ENCOUNTER — Other Ambulatory Visit: Payer: Self-pay | Admitting: Endocrinology

## 2013-07-06 ENCOUNTER — Ambulatory Visit: Payer: Medicare Other | Admitting: Cardiology

## 2013-07-06 NOTE — Telephone Encounter (Signed)
Script faxed to pharmacy

## 2013-07-10 ENCOUNTER — Other Ambulatory Visit: Payer: Self-pay | Admitting: Cardiology

## 2013-07-28 ENCOUNTER — Other Ambulatory Visit: Payer: Self-pay

## 2013-07-28 MED ORDER — METOLAZONE 2.5 MG PO TABS: 2.5000 mg | ORAL_TABLET | Freq: Two times a day (BID) | ORAL | Status: DC

## 2013-08-19 ENCOUNTER — Ambulatory Visit (INDEPENDENT_AMBULATORY_CARE_PROVIDER_SITE_OTHER): Payer: Medicare Other | Admitting: Endocrinology

## 2013-08-19 ENCOUNTER — Encounter: Payer: Self-pay | Admitting: Endocrinology

## 2013-08-19 VITALS — BP 106/58 | HR 83 | Temp 97.9°F | Ht 62.0 in | Wt 231.0 lb

## 2013-08-19 DIAGNOSIS — R0609 Other forms of dyspnea: Secondary | ICD-10-CM | POA: Diagnosis not present

## 2013-08-19 DIAGNOSIS — Z23 Encounter for immunization: Secondary | ICD-10-CM

## 2013-08-19 DIAGNOSIS — R229 Localized swelling, mass and lump, unspecified: Secondary | ICD-10-CM

## 2013-08-19 DIAGNOSIS — R0989 Other specified symptoms and signs involving the circulatory and respiratory systems: Secondary | ICD-10-CM

## 2013-08-19 DIAGNOSIS — D509 Iron deficiency anemia, unspecified: Secondary | ICD-10-CM

## 2013-08-19 DIAGNOSIS — E1029 Type 1 diabetes mellitus with other diabetic kidney complication: Secondary | ICD-10-CM | POA: Diagnosis not present

## 2013-08-19 DIAGNOSIS — E1065 Type 1 diabetes mellitus with hyperglycemia: Secondary | ICD-10-CM | POA: Diagnosis not present

## 2013-08-19 DIAGNOSIS — N951 Menopausal and female climacteric states: Secondary | ICD-10-CM | POA: Diagnosis not present

## 2013-08-19 DIAGNOSIS — R0600 Dyspnea, unspecified: Secondary | ICD-10-CM

## 2013-08-19 DIAGNOSIS — I5022 Chronic systolic (congestive) heart failure: Secondary | ICD-10-CM

## 2013-08-19 LAB — HEMOGLOBIN A1C: Hgb A1c MFr Bld: 7.7 % — ABNORMAL HIGH (ref 4.6–6.5)

## 2013-08-19 LAB — BASIC METABOLIC PANEL
BUN: 95 mg/dL — AB (ref 6–23)
CO2: 26 meq/L (ref 19–32)
Calcium: 10.1 mg/dL (ref 8.4–10.5)
Chloride: 100 mEq/L (ref 96–112)
Creatinine, Ser: 3.1 mg/dL — ABNORMAL HIGH (ref 0.4–1.2)
GFR: 16.39 mL/min — ABNORMAL LOW (ref >60.00)
GLUCOSE: 201 mg/dL — AB (ref 70–99)
POTASSIUM: 4.6 meq/L (ref 3.5–5.1)
Sodium: 136 mEq/L (ref 135–145)

## 2013-08-19 LAB — BRAIN NATRIURETIC PEPTIDE: Pro B Natriuretic peptide (BNP): 370 pg/mL — ABNORMAL HIGH (ref 0.0–100.0)

## 2013-08-19 NOTE — Progress Notes (Signed)
Subjective:    Patient ID: [name], female    DOB: Aug 25, 1950, 63 y.o.   MRN: [id number]  HPI pt returns for f/u of insulin-requiring DM (dx'ed 2001; she has been on insulin since soon after dx; she has moderate neuropathy of the lower extremities; she has associated nephropathy, CAD, PAD, and retinopathy).  no cbg record, but states cbg's are well-controlled.  There is no trend throughout the day.  She denies hypoglycemia.   Denies sob. Denies LOC Past Medical History  Diagnosis Date  . CHF (congestive heart failure)     EF 25%  . Cholelithiasis   . Diabetic retinopathy   . Diabetic nephropathy   . Hyperlipemia   . Diabetes mellitus   . Depression   . CAD (coronary artery disease)   . CVA (cerebral vascular accident)   . OSA (obstructive sleep apnea) 01/17/2011    Past Surgical History  Procedure Laterality Date  . Tonsillectomy    . Adenoidectomy    . Coronary artery bypass graft      X 5  . C-sections      x2  . Cholecystectomy    . Wisdom tooth extraction      x3  . Tracheostomy      History   Social History  . Marital Status: Divorced    Spouse Name: N/A    Number of Children: 1  . Years of Education: N/A   Occupational History  . Teacher     retired   Social History Main Topics  . Smoking status: Former Smoker -- 1 years    Types: Cigarettes    Quit date: 09/13/1968  . Smokeless tobacco: Not on file     Comment: occasional  . Alcohol Use: No  . Drug Use: No  . Sexual Activity: Not on file   Other Topics Concern  . Not on file   Social History Narrative  . No narrative on file    Current Outpatient Prescriptions on File Prior to Visit  Medication Sig Dispense Refill  . albuterol (PROVENTIL HFA;VENTOLIN HFA) 108 (90 BASE) MCG/ACT inhaler Inhale 2 puffs into the lungs every 6 (six) hours as needed for wheezing or shortness of breath.  1 Inhaler  0  . Ascorbic Acid (VITAMIN C) 500 MG tablet 2 tablets by mouth once daily      .  aspirin 81 MG tablet Take 81 mg by mouth daily.        . Calcium-Phosphorus-Vitamin D (CALCIUM GUMMIES PO) Take 1 tablet by mouth daily.        . Emollient (CERAVE) CREA Apply topically as directed.        . enalapril (VASOTEC) 10 MG tablet TAKE ONE TABLET BY MOUTH TWICE DAILY   60 tablet  2  . FIBER SELECT GUMMIES PO Take 1 tablet by mouth daily.        . furosemide (LASIX) 40 MG tablet Take 2 tablets (80 mg total) by mouth 2 (two) times daily.  120 tablet  3  . insulin NPH-regular Human (NOVOLIN 70/30) (70-30) 100 UNIT/ML injection 15 units with breakfast, and 10 units with the evening meal.      . metoprolol succinate (TOPROL-XL) 25 MG 24 hr tablet Take 1 tablet (25 mg total) by mouth daily.  90 tablet  3  . Multiple Vitamin (MULTIVITAMIN) tablet Take 1 tablet by mouth daily.        . nitroGLYCERIN (NITROSTAT) 0.4 MG SL tablet Place 1 tablet (0.4 mg  total) under the tongue every 5 (five) minutes as needed for chest pain.  25 tablet  11  . oxybutynin (DITROPAN-XL) 10 MG 24 hr tablet Take one tablet by mouth one time daily  30 tablet  9  . simvastatin (ZOCOR) 40 MG tablet Take 1 tablet (40 mg total) by mouth at bedtime.  30 tablet  1  . simvastatin (ZOCOR) 40 MG tablet TAKE ONE TABLET BY MOUTH ONE TIME DAILY   30 tablet  2  . traMADol-acetaminophen (ULTRACET) 37.5-325 MG per tablet TAKE ONE TABLET BY MOUTH EVERY FOUR HOURS AS NEEDED FOR PAIN   50 tablet  3  . [DISCONTINUED] oxybutynin (DITROPAN-XL) 10 MG 24 hr tablet Take 1 tablet (10 mg total) by mouth daily.  30 tablet  11   No current facility-administered medications on file prior to visit.    Allergies  Allergen Reactions  . Okra     Family History  Problem Relation Age of Onset  . Hypertension Mother   . Diabetes Mother   . Heart disease Mother   . Heart disease Father     Cardiovascular accident    BP 106/58  Pulse 83  Temp(Src) 97.9 F (36.6 C) (Oral)  Ht 5\' 2"  (1.575 m)  Wt 231 lb (104.781 kg)  BMI 42.24 kg/m2  SpO2  95%   Review of Systems She has slight intermittent dizziness.  Denies weight change.  She has a red spot on her left hand.    Objective:   Physical Exam VITAL SIGNS:  See vs page GENERAL: no distress NECK: There is no palpable thyroid enlargement.  No thyroid nodule is palpable.  No palpable lymphadenopathy at the anterior neck. LUNGS:  Clear to auscultation Skin: 1 cm red spot on the dorsal aspect of the left hand.     Lab Results  Component Value Date   WBC 9.0 05/11/2013   HGB 13.5 05/11/2013   HCT 41.8 05/11/2013   PLT 203.0 05/11/2013   GLUCOSE 201* 08/19/2013   CHOL 145 05/11/2013   TRIG 70.0 05/11/2013   HDL 42.20 05/11/2013   LDLDIRECT 100.9 02/03/2008   LDLCALC 89 05/11/2013   ALT 23 05/11/2013   AST 29 05/11/2013   NA 136 08/19/2013   K 4.6 08/19/2013   CL 100 08/19/2013   CREATININE 3.1* 08/19/2013   BUN 95* 08/19/2013   CO2 26 08/19/2013   TSH 2.43 05/11/2013   INR 1.62* 01/19/2011   HGBA1C 7.7* 08/19/2013   MICROALBUR 20.6* 05/13/2013      Assessment & Plan:  DM: we can't ssfely increase insulin now Renal failure: worse due to diuresis. CAF: well-controlled, but we need to reduce diuretic rx. Skin macule, new, uncertain etiology.  BCCA is possible but unlikely.    Subjective:   Patient here for Medicare annual wellness visit and management of other chronic and acute problems.     Risk factors: advanced age    Roster of Physicians Providing Medical Care to Patient:  See "snapshot"   Activities of Daily Living: In your present state of health, do you have any difficulty performing the following activities?:  Preparing food and eating?: No  Bathing yourself: No  Getting dressed: No  Using the toilet:No  Moving around from place to place: No  In the past year have you fallen or had a near fall?:No    Home Safety: Has smoke detector and wears seat belts. No firearms. No excess sun exposure.  Diet and Exercise  Current exercise habits: limited  by health  problems Dietary issues discussed: pt reports a poor diet   Depression Screen  Q1: Over the past two weeks, have you felt down, depressed or hopeless? no  Q2: Over the past two weeks, have you felt little interest or pleasure in doing things? no   The following portions of the patient's history were reviewed and updated as appropriate: allergies, current medications, past family history, past medical history, past social history, past surgical history and problem list.  Past Medical History  Diagnosis Date  . CHF (congestive heart failure)     EF 25%  . Cholelithiasis   . Diabetic retinopathy   . Diabetic nephropathy   . Hyperlipemia   . Diabetes mellitus   . Depression   . CAD (coronary artery disease)   . CVA (cerebral vascular accident)   . OSA (obstructive sleep apnea) 01/17/2011    Past Surgical History  Procedure Laterality Date  . Tonsillectomy    . Adenoidectomy    . Coronary artery bypass graft      X 5  . C-sections      x2  . Cholecystectomy    . Wisdom tooth extraction      x3  . Tracheostomy      History   Social History  . Marital Status: Divorced    Spouse Name: N/A    Number of Children: 1  . Years of Education: N/A   Occupational History  . Teacher     retired   Social History Main Topics  . Smoking status: Former Smoker -- 1 years    Types: Cigarettes    Quit date: 09/13/1968  . Smokeless tobacco: Not on file     Comment: occasional  . Alcohol Use: No  . Drug Use: No  . Sexual Activity: Not on file   Other Topics Concern  . Not on file   Social History Narrative  . No narrative on file    Current Outpatient Prescriptions on File Prior to Visit  Medication Sig Dispense Refill  . albuterol (PROVENTIL HFA;VENTOLIN HFA) 108 (90 BASE) MCG/ACT inhaler Inhale 2 puffs into the lungs every 6 (six) hours as needed for wheezing or shortness of breath.  1 Inhaler  0  . Ascorbic Acid (VITAMIN C) 500 MG tablet 2 tablets by mouth once daily       . aspirin 81 MG tablet Take 81 mg by mouth daily.        . Calcium-Phosphorus-Vitamin D (CALCIUM GUMMIES PO) Take 1 tablet by mouth daily.        . Emollient (CERAVE) CREA Apply topically as directed.        . enalapril (VASOTEC) 10 MG tablet TAKE ONE TABLET BY MOUTH TWICE DAILY   60 tablet  2  . FIBER SELECT GUMMIES PO Take 1 tablet by mouth daily.        . furosemide (LASIX) 40 MG tablet Take 2 tablets (80 mg total) by mouth 2 (two) times daily.  120 tablet  3  . insulin NPH-regular Human (NOVOLIN 70/30) (70-30) 100 UNIT/ML injection 15 units with breakfast, and 10 units with the evening meal.      . metolazone (ZAROXOLYN) 2.5 MG tablet Take 1 tablet (2.5 mg total) by mouth 2 (two) times daily.  60 tablet  1  . metoprolol succinate (TOPROL-XL) 25 MG 24 hr tablet Take 1 tablet (25 mg total) by mouth daily.  90 tablet  3  . Multiple Vitamin (MULTIVITAMIN) tablet Take 1 tablet  by mouth daily.        . nitroGLYCERIN (NITROSTAT) 0.4 MG SL tablet Place 1 tablet (0.4 mg total) under the tongue every 5 (five) minutes as needed for chest pain.  25 tablet  11  . oxybutynin (DITROPAN-XL) 10 MG 24 hr tablet Take one tablet by mouth one time daily  30 tablet  9  . simvastatin (ZOCOR) 40 MG tablet Take 1 tablet (40 mg total) by mouth at bedtime.  30 tablet  1  . simvastatin (ZOCOR) 40 MG tablet TAKE ONE TABLET BY MOUTH ONE TIME DAILY   30 tablet  2  . traMADol-acetaminophen (ULTRACET) 37.5-325 MG per tablet TAKE ONE TABLET BY MOUTH EVERY FOUR HOURS AS NEEDED FOR PAIN   50 tablet  3  . [DISCONTINUED] oxybutynin (DITROPAN-XL) 10 MG 24 hr tablet Take 1 tablet (10 mg total) by mouth daily.  30 tablet  11   No current facility-administered medications on file prior to visit.    Allergies  Allergen Reactions  . Okra     Family History  Problem Relation Age of Onset  . Hypertension Mother   . Diabetes Mother   . Heart disease Mother   . Heart disease Father     Cardiovascular accident   BP 106/58   Pulse 83  Temp(Src) 97.9 F (36.6 C) (Oral)  Ht 5\' 2"  (1.575 m)  Wt 231 lb (104.781 kg)  BMI 42.24 kg/m2  SpO2 95%  Review of Systems  Denies hearing loss, and visual loss Objective:   Vision:  Sees opthalmologist Hearing: grossly normal Body mass index:  See vs page Msk: pt easily but slowly performs "get-up-and-go" from a sitting position Cognitive Impairment Assessment: cognition, memory and judgment appear normal.  remembers 3/3 at 5 minutes.  excellent recall.  can easily read and write a sentence.  alert and oriented x 3.     Assessment:   Medicare wellness utd on preventive parameters    Plan:   During the course of the visit the patient was educated and counseled about appropriate screening and preventive services including:        Fall prevention   Screening mammography  Bone densitometry screening  Diabetes screening  Nutrition counseling  Refuses colonoscopy Vaccines / LABS Zostavax / Pneumococcal Vaccine  today   Patient Instructions (the written plan) was given to the patient.   we discussed code status.  pt requests full code, but would not want to be started or maintained on artificial life-support measures if there was not a reasonable chance of recovery

## 2013-08-19 NOTE — Patient Instructions (Addendum)
good diet and exercise habits significanly improve the control of your diabetes.  please let me know if you wish to be referred to a dietician.  high blood sugar is very risky to your health.  you should see an eye doctor every year.  You are at higher than average risk for pneumonia and hepatitis-B.  You should be vaccinated against both.   blood tests are being requested for you today.  We'll contact you with results. please consider these measures for your health:  minimize alcohol.  do not use tobacco products.  have a colonoscopy at least every 10 years from age 64.  Women should have an annual mammogram from age 64.  keep firearms safely stored.  always use seat belts.  have working smoke alarms in your home.  see an eye doctor and dentist regularly.  never drive under the influence of alcohol or drugs (including prescription drugs).  those with fair skin should take precautions against the sun. it is critically important to prevent falling down (keep floor areas well-lit, dry, and free of loose objects.  If you have a cane, walker, or wheelchair, you should use it, even for short trips around the house.  Also, try not to rush).   [facility] [phone], to make an appointment for a mammogram. Please come back for a follow-up appointment in 3 months. Refer to a dermatology specialist.  you will receive a phone call, about a day and time for an appointment.

## 2013-08-21 ENCOUNTER — Other Ambulatory Visit: Payer: Medicare Other

## 2013-08-25 ENCOUNTER — Ambulatory Visit (INDEPENDENT_AMBULATORY_CARE_PROVIDER_SITE_OTHER)
Admission: RE | Admit: 2013-08-25 | Discharge: 2013-08-25 | Disposition: A | Discharge status: Home / Self Care | Payer: Medicare Other | Source: Ambulatory Visit | Attending: Endocrinology | Admitting: Endocrinology

## 2013-08-25 DIAGNOSIS — N951 Menopausal and female climacteric states: Secondary | ICD-10-CM | POA: Diagnosis not present

## 2013-09-04 ENCOUNTER — Encounter: Payer: Self-pay | Admitting: Endocrinology

## 2013-09-04 ENCOUNTER — Ambulatory Visit (INDEPENDENT_AMBULATORY_CARE_PROVIDER_SITE_OTHER): Payer: Medicare Other | Admitting: Endocrinology

## 2013-09-04 ENCOUNTER — Ambulatory Visit
Admission: RE | Admit: 2013-09-04 | Discharge: 2013-09-04 | Disposition: A | Discharge status: Home / Self Care | Payer: Medicare Other | Source: Ambulatory Visit | Attending: Endocrinology | Admitting: Endocrinology

## 2013-09-04 VITALS — BP 116/60 | HR 86 | Temp 98.1°F | Wt 223.0 lb

## 2013-09-04 DIAGNOSIS — IMO0002 Reserved for concepts with insufficient information to code with codable children: Secondary | ICD-10-CM

## 2013-09-04 DIAGNOSIS — R071 Chest pain on breathing: Secondary | ICD-10-CM

## 2013-09-04 DIAGNOSIS — R0789 Other chest pain: Secondary | ICD-10-CM

## 2013-09-04 DIAGNOSIS — N289 Disorder of kidney and ureter, unspecified: Secondary | ICD-10-CM | POA: Diagnosis not present

## 2013-09-04 DIAGNOSIS — I509 Heart failure, unspecified: Secondary | ICD-10-CM | POA: Diagnosis not present

## 2013-09-04 DIAGNOSIS — R079 Chest pain, unspecified: Secondary | ICD-10-CM | POA: Diagnosis not present

## 2013-09-04 DIAGNOSIS — Z951 Presence of aortocoronary bypass graft: Secondary | ICD-10-CM | POA: Diagnosis not present

## 2013-09-04 LAB — BASIC METABOLIC PANEL
BUN: 88 mg/dL — AB (ref 6–23)
CHLORIDE: 100 meq/L (ref 96–112)
CO2: 23 mEq/L (ref 19–32)
Calcium: 9.5 mg/dL (ref 8.4–10.5)
Creatinine, Ser: 2.9 mg/dL — ABNORMAL HIGH (ref 0.4–1.2)
GFR: 17.57 mL/min — AB (ref >60.00)
Glucose, Bld: 160 mg/dL — ABNORMAL HIGH (ref 70–99)
Potassium: 4.3 mEq/L (ref 3.5–5.1)
SODIUM: 134 meq/L — AB (ref 135–145)

## 2013-09-04 LAB — URINALYSIS, ROUTINE W REFLEX MICROSCOPIC
Bilirubin Urine: NEGATIVE
Hgb urine dipstick: NEGATIVE
KETONES UR: NEGATIVE
LEUKOCYTES UA: NEGATIVE
NITRITE: NEGATIVE
PH: 5 (ref 5.0–8.0)
SPECIFIC GRAVITY, URINE: 1.02 (ref 1.000–1.030)
Total Protein, Urine: NEGATIVE
UROBILINOGEN UA: 0.2 (ref 0.0–1.0)
Urine Glucose: NEGATIVE

## 2013-09-04 NOTE — Progress Notes (Signed)
Subjective:    Patient ID: [name], female    DOB: May 07, 1950, 63 y.o.   MRN: [id number]  HPI Yesterday, pt had sudden-onset severe pleuritis-quality pain at the right lower back, and assoc sob.  Pain is much less now. Past Medical History  Diagnosis Date  . CHF (congestive heart failure)     EF 25%  . Cholelithiasis   . Diabetic retinopathy   . Diabetic nephropathy   . Hyperlipemia   . Diabetes mellitus   . Depression   . CAD (coronary artery disease)   . CVA (cerebral vascular accident)   . OSA (obstructive sleep apnea) 01/17/2011    Past Surgical History  Procedure Laterality Date  . Tonsillectomy    . Adenoidectomy    . Coronary artery bypass graft      X 5  . C-sections      x2  . Cholecystectomy    . Wisdom tooth extraction      x3  . Tracheostomy      History   Social History  . Marital Status: Divorced    Spouse Name: N/A    Number of Children: 1  . Years of Education: N/A   Occupational History  . Teacher     retired   Social History Main Topics  . Smoking status: Former Smoker -- 1 years    Types: Cigarettes    Quit date: 09/13/1968  . Smokeless tobacco: Not on file     Comment: occasional  . Alcohol Use: No  . Drug Use: No  . Sexual Activity: Not on file   Other Topics Concern  . Not on file   Social History Narrative  . No narrative on file    Current Outpatient Prescriptions on File Prior to Visit  Medication Sig Dispense Refill  . albuterol (PROVENTIL HFA;VENTOLIN HFA) 108 (90 BASE) MCG/ACT inhaler Inhale 2 puffs into the lungs every 6 (six) hours as needed for wheezing or shortness of breath.  1 Inhaler  0  . Ascorbic Acid (VITAMIN C) 500 MG tablet 2 tablets by mouth once daily      . aspirin 81 MG tablet Take 81 mg by mouth daily.        . Calcium-Phosphorus-Vitamin D (CALCIUM GUMMIES PO) Take 1 tablet by mouth daily.        . Emollient (CERAVE) CREA Apply topically as directed.        . enalapril (VASOTEC) 10 MG  tablet TAKE ONE TABLET BY MOUTH TWICE DAILY   60 tablet  2  . FIBER SELECT GUMMIES PO Take 1 tablet by mouth daily.        . insulin NPH-regular Human (NOVOLIN 70/30) (70-30) 100 UNIT/ML injection 15 units with breakfast, and 10 units with the evening meal.      . metoprolol succinate (TOPROL-XL) 25 MG 24 hr tablet Take 1 tablet (25 mg total) by mouth daily.  90 tablet  3  . Multiple Vitamin (MULTIVITAMIN) tablet Take 1 tablet by mouth daily.        . nitroGLYCERIN (NITROSTAT) 0.4 MG SL tablet Place 1 tablet (0.4 mg total) under the tongue every 5 (five) minutes as needed for chest pain.  25 tablet  11  . oxybutynin (DITROPAN-XL) 10 MG 24 hr tablet Take one tablet by mouth one time daily  30 tablet  9  . simvastatin (ZOCOR) 40 MG tablet Take 1 tablet (40 mg total) by mouth at bedtime.  30 tablet  1  .  simvastatin (ZOCOR) 40 MG tablet TAKE ONE TABLET BY MOUTH ONE TIME DAILY   30 tablet  2  . traMADol-acetaminophen (ULTRACET) 37.5-325 MG per tablet TAKE ONE TABLET BY MOUTH EVERY FOUR HOURS AS NEEDED FOR PAIN   50 tablet  3  . [DISCONTINUED] oxybutynin (DITROPAN-XL) 10 MG 24 hr tablet Take 1 tablet (10 mg total) by mouth daily.  30 tablet  11   No current facility-administered medications on file prior to visit.    Allergies  Allergen Reactions  . Okra     Family History  Problem Relation Age of Onset  . Hypertension Mother   . Diabetes Mother   . Heart disease Mother   . Heart disease Father     Cardiovascular accident    BP 116/60  Pulse 86  Temp(Src) 98.1 F (36.7 C) (Oral)  Wt 223 lb (101.152 kg)  SpO2 91%   Review of Systems She has dark urine.  Fatigue persists.  Sob is less now.    Objective:   Physical Exam VITAL SIGNS:  See vs page GENERAL: no distress Chest wall: slightly tender at the right posterior aspect. LUNGS:  Clear to auscultation. Ext: trace bilat leg edema   D-dimer is pos Lab Results  Component Value Date   CREATININE 2.9* 09/04/2013   BUN 88*  09/04/2013   NA 134* 09/04/2013   K 4.3 09/04/2013   CL 100 09/04/2013   CO2 23 09/04/2013       Assessment & Plan:  Chest-wall pain: improved, but d-dimer raises the possibility of PE. Renal failure: persistent: we'll have to d/c diuretic rx for now CHF: clinically improved, but she seems volume-sensitive.   Patient Instructions  blood and urine tests are being requested for you today.  We'll contact you with results. Refer to a CHF specialist.  you will receive a phone call, about a day and time for an appointment.  Also, please do a chest-x-ray downstairs today.   Addendum 09/05/13: pt is called.  i left message on VM.  Go to ER now.

## 2013-09-04 NOTE — Patient Instructions (Addendum)
blood and urine tests are being requested for you today.  We'll contact you with results. Refer to a CHF specialist.  you will receive a phone call, about a day and time for an appointment.  Also, please do a chest-x-ray downstairs today.

## 2013-09-05 ENCOUNTER — Other Ambulatory Visit: Payer: Self-pay | Admitting: Endocrinology

## 2013-09-05 LAB — D-DIMER, QUANTITATIVE (NOT AT ARMC): D DIMER QUANT: 2.51 ug{FEU}/mL — AB (ref 0.00–0.48)

## 2013-09-08 ENCOUNTER — Telehealth: Payer: Self-pay | Admitting: Endocrinology

## 2013-09-08 DIAGNOSIS — R079 Chest pain, unspecified: Secondary | ICD-10-CM | POA: Insufficient documentation

## 2013-09-08 NOTE — Telephone Encounter (Signed)
Called [facility] to schedule nuclear medicine scan. Nuc med dept had left for the day when I called. I spoke with peggy who stated she would call back to let office know if pt could come in tomorrow. Pt's daughter informed.

## 2013-09-08 NOTE — Telephone Encounter (Signed)
please call patient: Did you receive my voice message on Saturday?

## 2013-09-08 NOTE — Telephone Encounter (Signed)
please call patient: i ordered v/q scan

## 2013-09-08 NOTE — Telephone Encounter (Signed)
Nuclear medicine called back instructing me to call tomorrow morning to schedule. Pt's daughter advised to be ready to be and [facility] at 11 am to have scan done.

## 2013-09-08 NOTE — Telephone Encounter (Signed)
Requested call back to discuss.  

## 2013-09-09 ENCOUNTER — Other Ambulatory Visit: Payer: Self-pay | Admitting: Endocrinology

## 2013-09-09 ENCOUNTER — Ambulatory Visit (HOSPITAL_COMMUNITY)
Admission: RE | Admit: 2013-09-09 | Discharge: 2013-09-09 | Disposition: A | Discharge status: Home / Self Care | Payer: Medicare Other | Source: Ambulatory Visit | Attending: Endocrinology | Admitting: Endocrinology

## 2013-09-09 DIAGNOSIS — Z951 Presence of aortocoronary bypass graft: Secondary | ICD-10-CM | POA: Diagnosis not present

## 2013-09-09 DIAGNOSIS — I517 Cardiomegaly: Secondary | ICD-10-CM | POA: Insufficient documentation

## 2013-09-09 DIAGNOSIS — R0609 Other forms of dyspnea: Secondary | ICD-10-CM | POA: Insufficient documentation

## 2013-09-09 DIAGNOSIS — R079 Chest pain, unspecified: Secondary | ICD-10-CM | POA: Diagnosis not present

## 2013-09-09 DIAGNOSIS — R0600 Dyspnea, unspecified: Secondary | ICD-10-CM

## 2013-09-09 DIAGNOSIS — R791 Abnormal coagulation profile: Secondary | ICD-10-CM | POA: Insufficient documentation

## 2013-09-09 DIAGNOSIS — R0989 Other specified symptoms and signs involving the circulatory and respiratory systems: Principal | ICD-10-CM | POA: Insufficient documentation

## 2013-09-09 MED ORDER — TECHNETIUM TC 99M DIETHYLENETRIAME-PENTAACETIC ACID: 44.0000 | Freq: Once | INTRAVENOUS | Status: AC | PRN

## 2013-09-09 MED ORDER — TECHNETIUM TO 99M ALBUMIN AGGREGATED: 5.3000 | Freq: Once | INTRAVENOUS | Status: AC | PRN

## 2013-09-09 NOTE — Telephone Encounter (Signed)
Called [facility]. Pt will have Nuc med lung scan today at 1:00 pm. Orders for chest X-ray placed. Pt notified to be at [facility] at 12:30.

## 2013-09-16 DIAGNOSIS — E11339 Type 2 diabetes mellitus with moderate nonproliferative diabetic retinopathy without macular edema: Secondary | ICD-10-CM | POA: Diagnosis not present

## 2013-09-16 DIAGNOSIS — E1139 Type 2 diabetes mellitus with other diabetic ophthalmic complication: Secondary | ICD-10-CM | POA: Diagnosis not present

## 2013-09-16 DIAGNOSIS — H2510 Age-related nuclear cataract, unspecified eye: Secondary | ICD-10-CM | POA: Diagnosis not present

## 2013-09-29 ENCOUNTER — Other Ambulatory Visit: Payer: Self-pay | Admitting: Endocrinology

## 2013-10-09 ENCOUNTER — Other Ambulatory Visit: Payer: Self-pay | Admitting: Endocrinology

## 2013-10-14 ENCOUNTER — Ambulatory Visit (INDEPENDENT_AMBULATORY_CARE_PROVIDER_SITE_OTHER): Payer: Medicare Other | Admitting: Endocrinology

## 2013-10-14 ENCOUNTER — Encounter: Payer: Self-pay | Admitting: Endocrinology

## 2013-10-14 VITALS — BP 134/67 | HR 88 | Temp 98.0°F | Ht 62.0 in | Wt 254.0 lb

## 2013-10-14 DIAGNOSIS — I509 Heart failure, unspecified: Secondary | ICD-10-CM | POA: Diagnosis not present

## 2013-10-14 DIAGNOSIS — R0609 Other forms of dyspnea: Secondary | ICD-10-CM | POA: Diagnosis not present

## 2013-10-14 DIAGNOSIS — R0600 Dyspnea, unspecified: Secondary | ICD-10-CM

## 2013-10-14 DIAGNOSIS — R0989 Other specified symptoms and signs involving the circulatory and respiratory systems: Secondary | ICD-10-CM

## 2013-10-14 DIAGNOSIS — N289 Disorder of kidney and ureter, unspecified: Secondary | ICD-10-CM | POA: Diagnosis not present

## 2013-10-14 LAB — BASIC METABOLIC PANEL
BUN: 32 mg/dL — AB (ref 6–23)
CO2: 20 mEq/L (ref 19–32)
Calcium: 9.1 mg/dL (ref 8.4–10.5)
Chloride: 112 mEq/L (ref 96–112)
Creatinine, Ser: 1.7 mg/dL — ABNORMAL HIGH (ref 0.4–1.2)
GFR: 33.05 mL/min — AB (ref >60.00)
Glucose, Bld: 141 mg/dL — ABNORMAL HIGH (ref 70–99)
Potassium: 4.8 mEq/L (ref 3.5–5.1)
Sodium: 139 mEq/L (ref 135–145)

## 2013-10-14 LAB — BRAIN NATRIURETIC PEPTIDE: Pro B Natriuretic peptide (BNP): 851 pg/mL — ABNORMAL HIGH (ref 0.0–100.0)

## 2013-10-14 MED ORDER — FUROSEMIDE 80 MG PO TABS: 80.0000 mg | ORAL_TABLET | Freq: Two times a day (BID) | ORAL | Status: DC

## 2013-10-14 NOTE — Patient Instructions (Addendum)
blood tests are being requested for you today.  We'll contact you with results. Please see a CHF specialist.  you will receive a phone call, about a day and time for an appointment. i have sent a prescription to your pharmacy, to resume the furosemide. Please come back for a follow-up appointment in 1 week.

## 2013-10-14 NOTE — Progress Notes (Signed)
Subjective:    Patient ID: [name], female    DOB: 1950-07-02, 63 y.o.   MRN: [id number]  HPI Pt states few days of moderate swelling of the legs (R>L), and assoc doe.  She takes no diuretic rx.   Past Medical History  Diagnosis Date  . CHF (congestive heart failure)     EF 25%  . Cholelithiasis   . Diabetic retinopathy   . Diabetic nephropathy   . Hyperlipemia   . Diabetes mellitus   . Depression   . CAD (coronary artery disease)   . CVA (cerebral vascular accident)   . OSA (obstructive sleep apnea) 01/17/2011    Past Surgical History  Procedure Laterality Date  . Tonsillectomy    . Adenoidectomy    . Coronary artery bypass graft      X 5  . C-sections      x2  . Cholecystectomy    . Wisdom tooth extraction      x3  . Tracheostomy      History   Social History  . Marital Status: Divorced    Spouse Name: N/A    Number of Children: 1  . Years of Education: N/A   Occupational History  . Teacher     retired   Social History Main Topics  . Smoking status: Former Smoker -- 1 years    Types: Cigarettes    Quit date: 09/13/1968  . Smokeless tobacco: Not on file     Comment: occasional  . Alcohol Use: No  . Drug Use: No  . Sexual Activity: Not on file   Other Topics Concern  . Not on file   Social History Narrative  . No narrative on file    Current Outpatient Prescriptions on File Prior to Visit  Medication Sig Dispense Refill  . albuterol (PROVENTIL HFA;VENTOLIN HFA) 108 (90 BASE) MCG/ACT inhaler Inhale 2 puffs into the lungs every 6 (six) hours as needed for wheezing or shortness of breath.  1 Inhaler  0  . Ascorbic Acid (VITAMIN C) 500 MG tablet 2 tablets by mouth once daily      . aspirin 81 MG tablet Take 81 mg by mouth daily.        . Calcium-Phosphorus-Vitamin D (CALCIUM GUMMIES PO) Take 1 tablet by mouth daily.        . Emollient (CERAVE) CREA Apply topically as directed.        . enalapril (VASOTEC) 10 MG tablet TAKE ONE TABLET  BY MOUTH TWICE DAILY   60 tablet  2  . FIBER SELECT GUMMIES PO Take 1 tablet by mouth daily.        . insulin NPH-regular Human (NOVOLIN 70/30) (70-30) 100 UNIT/ML injection 15 units with breakfast, and 10 units with the evening meal.      . metoprolol succinate (TOPROL-XL) 25 MG 24 hr tablet Take 1 tablet (25 mg total) by mouth daily.  90 tablet  3  . Multiple Vitamin (MULTIVITAMIN) tablet Take 1 tablet by mouth daily.        . nitroGLYCERIN (NITROSTAT) 0.4 MG SL tablet Place 1 tablet (0.4 mg total) under the tongue every 5 (five) minutes as needed for chest pain.  25 tablet  11  . oxybutynin (DITROPAN-XL) 10 MG 24 hr tablet Take one tablet by mouth one time daily  30 tablet  9  . simvastatin (ZOCOR) 40 MG tablet Take 1 tablet (40 mg total) by mouth at bedtime.  30 tablet  1  .  traMADol-acetaminophen (ULTRACET) 37.5-325 MG per tablet TAKE ONE TABLET BY MOUTH EVERY FOUR HOURS AS NEEDED FOR PAIN   50 tablet  2  . [DISCONTINUED] oxybutynin (DITROPAN-XL) 10 MG 24 hr tablet Take 1 tablet (10 mg total) by mouth daily.  30 tablet  11   No current facility-administered medications on file prior to visit.    Allergies  Allergen Reactions  . Okra     Family History  Problem Relation Age of Onset  . Hypertension Mother   . Diabetes Mother   . Heart disease Mother   . Heart disease Father     Cardiovascular accident    BP 134/67  Pulse 88  Temp(Src) 98 F (36.7 C) (Oral)  Ht 5\' 2"  (1.575 m)  Wt 254 lb (115.214 kg)  BMI 46.45 kg/m2  SpO2 92%    Review of Systems Denies chest pain and weight change.      Objective:   Physical Exam VITAL SIGNS:  See vs page GENERAL: no distress LUNGS:  Clear to auscultation Pulses: dorsalis pedis intact bilat.  Feet: no deformity. normal color and temp. 1+ bilat leg edema. Old healed surgical scar (vein harvest) on the right leg.  Skin: no ulcer on the feet.  Neuro: sensation is intact to touch on the feet, but decreased from normal.    Lab  Results  Component Value Date   CREATININE 1.7* 10/14/2013   BUN 32* 10/14/2013   NA 139 10/14/2013   K 4.8 10/14/2013   CL 112 10/14/2013   CO2 20 10/14/2013       Assessment & Plan:  CHF: moderate exacerbation.   Renal failure: improved   Patient is advised the following: Patient Instructions  blood tests are being requested for you today.  We'll contact you with results. Please see a CHF specialist.  you will receive a phone call, about a day and time for an appointment. i have sent a prescription to your pharmacy, to resume the furosemide. Please come back for a follow-up appointment in 1 week.

## 2013-10-19 ENCOUNTER — Encounter: Payer: Self-pay | Admitting: Endocrinology

## 2013-10-22 ENCOUNTER — Telehealth: Payer: Self-pay

## 2013-10-22 MED ORDER — FUROSEMIDE 80 MG PO TABS: 80.0000 mg | ORAL_TABLET | Freq: Two times a day (BID) | ORAL | Status: DC

## 2013-10-22 MED ORDER — ENALAPRIL MALEATE 10 MG PO TABS: ORAL_TABLET | ORAL | Status: DC

## 2013-10-22 NOTE — Telephone Encounter (Signed)
Rx sent to pharmacy. Pt advised.

## 2013-10-22 NOTE — Telephone Encounter (Signed)
Please refill prn 

## 2013-10-22 NOTE — Telephone Encounter (Signed)
Pt called requesting a refill for Enalapril. The Last refill was done by Dr. Antoine PocheHochrein. Please advise if ok refill. Thanks!

## 2013-10-26 ENCOUNTER — Encounter: Payer: Self-pay | Admitting: Endocrinology

## 2013-10-26 ENCOUNTER — Telehealth: Payer: Self-pay | Admitting: Endocrinology

## 2013-10-26 ENCOUNTER — Ambulatory Visit (INDEPENDENT_AMBULATORY_CARE_PROVIDER_SITE_OTHER): Payer: Medicare Other | Admitting: Endocrinology

## 2013-10-26 VITALS — BP 114/80 | HR 87 | Temp 98.1°F | Ht 62.0 in | Wt 231.0 lb

## 2013-10-26 DIAGNOSIS — R0609 Other forms of dyspnea: Secondary | ICD-10-CM | POA: Diagnosis not present

## 2013-10-26 DIAGNOSIS — E1065 Type 1 diabetes mellitus with hyperglycemia: Secondary | ICD-10-CM | POA: Diagnosis not present

## 2013-10-26 DIAGNOSIS — E1029 Type 1 diabetes mellitus with other diabetic kidney complication: Secondary | ICD-10-CM

## 2013-10-26 DIAGNOSIS — R0989 Other specified symptoms and signs involving the circulatory and respiratory systems: Secondary | ICD-10-CM | POA: Diagnosis not present

## 2013-10-26 DIAGNOSIS — I509 Heart failure, unspecified: Secondary | ICD-10-CM

## 2013-10-26 DIAGNOSIS — R0600 Dyspnea, unspecified: Secondary | ICD-10-CM

## 2013-10-26 LAB — BRAIN NATRIURETIC PEPTIDE: Pro B Natriuretic peptide (BNP): 161 pg/mL — ABNORMAL HIGH (ref 0.0–100.0)

## 2013-10-26 LAB — BASIC METABOLIC PANEL
BUN: 83 mg/dL — ABNORMAL HIGH (ref 6–23)
CO2: 28 mEq/L (ref 19–32)
CREATININE: 3.1 mg/dL — AB (ref 0.4–1.2)
Calcium: 9.5 mg/dL (ref 8.4–10.5)
Chloride: 101 mEq/L (ref 96–112)
GFR: 15.95 mL/min — AB (ref >60.00)
Glucose, Bld: 144 mg/dL — ABNORMAL HIGH (ref 70–99)
Potassium: 4.5 mEq/L (ref 3.5–5.1)
SODIUM: 138 meq/L (ref 135–145)

## 2013-10-26 LAB — HEMOGLOBIN A1C: HEMOGLOBIN A1C: 6.3 % (ref 4.6–6.5)

## 2013-10-26 NOTE — Patient Instructions (Addendum)
blood tests are being requested for you today.  We'll contact you with results.   Please come back for a follow-up appointment in 3 months.    

## 2013-10-26 NOTE — Progress Notes (Signed)
Subjective:    Patient ID: [name], female    DOB: Jul 04, 1950, 63 y.o.   MRN: [id number]  HPI Pt states few weeks of moderate swelling of the legs (R>L), but now only minimal assoc doe.  Swelling has improved with lasix.  Past Medical History  Diagnosis Date  . CHF (congestive heart failure)     EF 25%  . Cholelithiasis   . Diabetic retinopathy   . Diabetic nephropathy   . Hyperlipemia   . Diabetes mellitus   . Depression   . CAD (coronary artery disease)   . CVA (cerebral vascular accident)   . OSA (obstructive sleep apnea) 01/17/2011    Past Surgical History  Procedure Laterality Date  . Tonsillectomy    . Adenoidectomy    . Coronary artery bypass graft      X 5  . C-sections      x2  . Cholecystectomy    . Wisdom tooth extraction      x3  . Tracheostomy      History   Social History  . Marital Status: Divorced    Spouse Name: N/A    Number of Children: 1  . Years of Education: N/A   Occupational History  . Teacher     retired   Social History Main Topics  . Smoking status: Former Smoker -- 1 years    Types: Cigarettes    Quit date: 09/13/1968  . Smokeless tobacco: Not on file     Comment: occasional  . Alcohol Use: No  . Drug Use: No  . Sexual Activity: Not on file   Other Topics Concern  . Not on file   Social History Narrative  . No narrative on file    Current Outpatient Prescriptions on File Prior to Visit  Medication Sig Dispense Refill  . albuterol (PROVENTIL HFA;VENTOLIN HFA) 108 (90 BASE) MCG/ACT inhaler Inhale 2 puffs into the lungs every 6 (six) hours as needed for wheezing or shortness of breath.  1 Inhaler  0  . Ascorbic Acid (VITAMIN C) 500 MG tablet 2 tablets by mouth once daily      . aspirin 81 MG tablet Take 81 mg by mouth daily.        . Calcium-Phosphorus-Vitamin D (CALCIUM GUMMIES PO) Take 1 tablet by mouth daily.        . Emollient (CERAVE) CREA Apply topically as directed.        . enalapril (VASOTEC) 10  MG tablet TAKE ONE TABLET BY MOUTH TWICE DAILY  60 tablet  2  . FIBER SELECT GUMMIES PO Take 1 tablet by mouth daily.        . insulin NPH-regular Human (NOVOLIN 70/30) (70-30) 100 UNIT/ML injection 15 units with breakfast, and 10 units with the evening meal.      . metoprolol succinate (TOPROL-XL) 25 MG 24 hr tablet Take 1 tablet (25 mg total) by mouth daily.  90 tablet  3  . Multiple Vitamin (MULTIVITAMIN) tablet Take 1 tablet by mouth daily.        . nitroGLYCERIN (NITROSTAT) 0.4 MG SL tablet Place 1 tablet (0.4 mg total) under the tongue every 5 (five) minutes as needed for chest pain.  25 tablet  11  . oxybutynin (DITROPAN-XL) 10 MG 24 hr tablet Take one tablet by mouth one time daily  30 tablet  9  . simvastatin (ZOCOR) 40 MG tablet Take 1 tablet (40 mg total) by mouth at bedtime.  30 tablet  1  .  traMADol-acetaminophen (ULTRACET) 37.5-325 MG per tablet TAKE ONE TABLET BY MOUTH EVERY FOUR HOURS AS NEEDED FOR PAIN   50 tablet  2  . [DISCONTINUED] oxybutynin (DITROPAN-XL) 10 MG 24 hr tablet Take 1 tablet (10 mg total) by mouth daily.  30 tablet  11   No current facility-administered medications on file prior to visit.    Allergies  Allergen Reactions  . Okra     Family History  Problem Relation Age of Onset  . Hypertension Mother   . Diabetes Mother   . Heart disease Mother   . Heart disease Father     Cardiovascular accident    BP 114/80  Pulse 87  Temp(Src) 98.1 F (36.7 C) (Oral)  Ht 5\' 2"  (1.575 m)  Wt 231 lb (104.781 kg)  BMI 42.24 kg/m2  SpO2 92%    Review of Systems She has lost a few lbs.  She denies hypoglycemia    Objective:   Physical Exam VITAL SIGNS:  See vs page GENERAL: no distress LUNGS:  Clear to auscultation Ext trace bilat leg edema  Lab Results  Component Value Date   HGBA1C 6.3 10/26/2013   Lab Results  Component Value Date   CREATININE 3.1* 10/26/2013   BUN 83* 10/26/2013   NA 138 10/26/2013   K 4.5 10/26/2013   CL 101 10/26/2013   CO2  28 10/26/2013      Assessment & Plan:  CHF: improved DM: overcontrolled Renal failure: worse.   Patient is advised the following: Patient Instructions  blood tests are being requested for you today.  We'll contact you with results.  Please come back for a follow-up appointment in 3 months.   Reduce insulin to 15 units with breakfast, and 5 units with the evening meal. Reduce lasix to 80 units qd.

## 2013-10-26 NOTE — Telephone Encounter (Signed)
please call patient: Blood sugar is a little too low Please reduce insulin to 15 units with breakfast, and 5 units with the evening meal.

## 2013-10-27 ENCOUNTER — Ambulatory Visit (HOSPITAL_COMMUNITY): Payer: Medicare Other | Attending: Cardiology

## 2013-10-27 DIAGNOSIS — I079 Rheumatic tricuspid valve disease, unspecified: Secondary | ICD-10-CM | POA: Diagnosis not present

## 2013-10-27 DIAGNOSIS — Z87891 Personal history of nicotine dependence: Secondary | ICD-10-CM | POA: Diagnosis not present

## 2013-10-27 DIAGNOSIS — Z8673 Personal history of transient ischemic attack (TIA), and cerebral infarction without residual deficits: Secondary | ICD-10-CM | POA: Diagnosis not present

## 2013-10-27 DIAGNOSIS — I5022 Chronic systolic (congestive) heart failure: Secondary | ICD-10-CM

## 2013-10-27 DIAGNOSIS — E119 Type 2 diabetes mellitus without complications: Secondary | ICD-10-CM | POA: Insufficient documentation

## 2013-10-27 DIAGNOSIS — E785 Hyperlipidemia, unspecified: Secondary | ICD-10-CM | POA: Diagnosis not present

## 2013-10-27 DIAGNOSIS — I509 Heart failure, unspecified: Secondary | ICD-10-CM | POA: Diagnosis not present

## 2013-10-27 DIAGNOSIS — I2510 Atherosclerotic heart disease of native coronary artery without angina pectoris: Secondary | ICD-10-CM | POA: Insufficient documentation

## 2013-10-27 DIAGNOSIS — I059 Rheumatic mitral valve disease, unspecified: Secondary | ICD-10-CM | POA: Diagnosis not present

## 2013-10-27 DIAGNOSIS — I517 Cardiomegaly: Secondary | ICD-10-CM | POA: Diagnosis not present

## 2013-10-27 NOTE — Progress Notes (Signed)
2D Echo completed. 10/27/2013

## 2013-10-27 NOTE — Telephone Encounter (Signed)
Pt advised of new instructions.  

## 2013-10-27 NOTE — Telephone Encounter (Signed)
Please advise pt CHF clinic has declined

## 2013-10-27 NOTE — Telephone Encounter (Signed)
[facility] Dr[facility] Gala RomneyBensimhon would not be able to see pt because her CHF was not advanced enough. Office suggested the pt follow up with Dr. Antoine PocheHochrein. Please advise, Thanks!

## 2013-10-28 ENCOUNTER — Other Ambulatory Visit: Payer: Self-pay | Admitting: Endocrinology

## 2013-10-28 DIAGNOSIS — I509 Heart failure, unspecified: Secondary | ICD-10-CM

## 2013-10-28 NOTE — Telephone Encounter (Signed)
Pt advised and instructed to follow up with Dr. Antoine PocheHochrein.

## 2013-11-16 ENCOUNTER — Other Ambulatory Visit: Payer: Self-pay | Admitting: Endocrinology

## 2013-11-24 ENCOUNTER — Encounter: Payer: Self-pay | Admitting: Cardiology

## 2013-11-24 ENCOUNTER — Ambulatory Visit: Payer: Medicare Other | Admitting: Endocrinology

## 2013-11-24 ENCOUNTER — Ambulatory Visit (INDEPENDENT_AMBULATORY_CARE_PROVIDER_SITE_OTHER): Payer: Medicare Other | Admitting: Cardiology

## 2013-11-24 VITALS — BP 142/100 | HR 78 | Ht 62.0 in | Wt 228.4 lb

## 2013-11-24 DIAGNOSIS — I509 Heart failure, unspecified: Secondary | ICD-10-CM

## 2013-11-24 MED ORDER — CARVEDILOL 3.125 MG PO TABS: 3.1250 mg | ORAL_TABLET | Freq: Two times a day (BID) | ORAL | Status: DC

## 2013-11-24 NOTE — Progress Notes (Signed)
HPI:  The patient presents for followup of CAD and ischemic cardiomyopathy.  She has been followed very closely by Romero BellingELLISON, SEAN, MD  and has had very careful adjustment of her diuretics and a subsequent 20 pound weight loss with great improvement in her symptoms. She has most recently had a creatinine that was elevated above her baseline 3.1. She however said she's not having his shortness of breath like she was previously. She's not describing any PND or orthopnea. Her daughter says her breathing is much better. She's not having any chest pressure, neck or arm discomfort. She's not having any palpitations.     Allergies  Allergen Reactions  . Okra     Current Outpatient Prescriptions  Medication Sig Dispense Refill  . albuterol (PROVENTIL HFA;VENTOLIN HFA) 108 (90 BASE) MCG/ACT inhaler Inhale 2 puffs into the lungs every 6 (six) hours as needed for wheezing or shortness of breath.  1 Inhaler  0  . Ascorbic Acid (VITAMIN C) 500 MG tablet 2 tablets by mouth once daily      . aspirin 81 MG tablet Take 81 mg by mouth daily.        . Calcium-Phosphorus-Vitamin D (CALCIUM GUMMIES PO) Take 1 tablet by mouth daily.        . Emollient (CERAVE) CREA Apply topically as directed.        . enalapril (VASOTEC) 10 MG tablet TAKE ONE TABLET BY MOUTH TWICE DAILY  60 tablet  2  . FIBER SELECT GUMMIES PO Take 1 tablet by mouth daily.        . furosemide (LASIX) 80 MG tablet Take 80 mg by mouth daily.      . insulin NPH-regular Human (NOVOLIN 70/30) (70-30) 100 UNIT/ML injection 15 units with breakfast, and 5 units with the evening meal.      . metoprolol succinate (TOPROL-XL) 25 MG 24 hr tablet Take 1 tablet (25 mg total) by mouth daily.  90 tablet  3  . Multiple Vitamin (MULTIVITAMIN) tablet Take 1 tablet by mouth daily.        . nitroGLYCERIN (NITROSTAT) 0.4 MG SL tablet Place 1 tablet (0.4 mg total) under the tongue every 5 (five) minutes as needed for chest pain.  25 tablet  11  . oxybutynin  (DITROPAN-XL) 10 MG 24 hr tablet Take one tablet by mouth one time daily  30 tablet  9  . simvastatin (ZOCOR) 40 MG tablet Take 1 tablet (40 mg total) by mouth at bedtime.  30 tablet  1  . traMADol-acetaminophen (ULTRACET) 37.5-325 MG per tablet TAKE ONE TABLET BY MOUTH EVERY FOUR HOURS AS NEEDED FOR PAIN   50 tablet  2  . [DISCONTINUED] oxybutynin (DITROPAN-XL) 10 MG 24 hr tablet Take 1 tablet (10 mg total) by mouth daily.  30 tablet  11   No current facility-administered medications for this visit.    Past Medical History  Diagnosis Date  . CHF (congestive heart failure)     EF 25%  . Cholelithiasis   . Diabetic retinopathy   . Diabetic nephropathy   . Hyperlipemia   . Diabetes mellitus   . Depression   . CAD (coronary artery disease)   . CVA (cerebral vascular accident)   . OSA (obstructive sleep apnea) 01/17/2011    Past Surgical History  Procedure Laterality Date  . Tonsillectomy    . Adenoidectomy    . Coronary artery bypass graft      X 5  . C-sections  x2  . Cholecystectomy    . Wisdom tooth extraction      x3  . Tracheostomy      ROS:  As stated in the HPI and negative for all other systems.  PHYSICAL EXAM BP 142/100  Pulse 78  Ht 5\' 2"  (1.575 m)  Wt 228 lb 6.4 oz (103.602 kg)  BMI 41.76 kg/m2 GENERAL:  Well appearing NECK:  No jugular venous distention, waveform within normal limits, carotid upstroke brisk and symmetric, no bruits, no thyromegaly LYMPHATICS:  No cervical, inguinal adenopathy LUNGS:  Clear to auscultation bilaterally BACK:  No CVA tenderness CHEST:  Unremarkable HEART:  PMI not displaced or sustained,S1 and S2 within normal limits, no S3, no S4, no clicks, no rubs, no murmurs ABD:  Flat, positive bowel sounds normal in frequency in pitch, no bruits, no rebound, no guarding, no midline pulsatile mass, no hepatomegaly, no splenomegaly EXT:  2 plus pulses throughout, no edema, no cyanosis no clubbing  EKG:  Sinus rhythm, rate 78, left  bundle branch block, left axis the patient, acute ST-T wave changes.  11/24/2013  ASSESSMENT AND PLAN  CAD - We again had a long discussion about medical management versus further evaluation to include a cath. She understands that her ejection fraction has fallen. She understands that she had severe diffuse disease prior bypass grafting 14 years ago. There is a small possibility that we might find obstructive graft disease now that we could intervene on the potentially improve myocardial function. However, given the fact she had been needed severe diffuse vessel disease I think that the likelihood of finding disease on which we would intervene is low. Catheterization would be at high risk for renal insufficiency. She wants medical management. She actually feels like she is at the end of her life and wants only symptomatic treatment. I will however defer any further conversation about DO NOT RESUSCITATE status to Dr. Everardo All.  CHRONIC SYSTOLIC HEART FAILURE - She is symptomatically better. Further try to titrate her medicines I'm going to switch from Toprol to carvedilol. She's not having any of the orthostatic symptoms that she was having previously.  ESSENTIAL HYPERTENSION, BENIGN -  This is being managed in the context of treating her CHF   Renal insufficiency -  This was followed expertly by Dr. Everardo All. The creatinine was elevated to 3.1recently and her diuretic was briefly adjusted. This is being followed closely by Dr. Everardo All.

## 2013-11-24 NOTE — Patient Instructions (Addendum)
Your physician recommends that you schedule a follow-up appointment in: 2 months with Dr. Antoine PocheHochrein    Stop Taking your metoprolol  Start taking Carvediolol  3.125 mg two times a day

## 2013-11-30 ENCOUNTER — Other Ambulatory Visit: Payer: Self-pay | Admitting: Internal Medicine

## 2013-12-01 NOTE — Telephone Encounter (Signed)
This is our pt.

## 2013-12-15 ENCOUNTER — Telehealth: Payer: Self-pay | Admitting: Cardiology

## 2013-12-15 NOTE — Telephone Encounter (Signed)
Left msg to day and 11/23/13 for michelle (per her request) to call to make 2 month follow up appt with dr. Antoine Poche.  Due in October.

## 2013-12-18 ENCOUNTER — Other Ambulatory Visit: Payer: Self-pay | Admitting: Endocrinology

## 2014-01-19 ENCOUNTER — Other Ambulatory Visit: Payer: Self-pay | Admitting: Endocrinology

## 2014-01-26 ENCOUNTER — Ambulatory Visit (INDEPENDENT_AMBULATORY_CARE_PROVIDER_SITE_OTHER): Payer: Medicare Other | Admitting: Endocrinology

## 2014-01-26 ENCOUNTER — Encounter: Payer: Self-pay | Admitting: Endocrinology

## 2014-01-26 VITALS — BP 108/55 | HR 88 | Temp 98.4°F | Ht 62.0 in | Wt 229.0 lb

## 2014-01-26 DIAGNOSIS — N289 Disorder of kidney and ureter, unspecified: Secondary | ICD-10-CM

## 2014-01-26 DIAGNOSIS — E1029 Type 1 diabetes mellitus with other diabetic kidney complication: Secondary | ICD-10-CM | POA: Diagnosis not present

## 2014-01-26 DIAGNOSIS — E1065 Type 1 diabetes mellitus with hyperglycemia: Secondary | ICD-10-CM

## 2014-01-26 DIAGNOSIS — Z79899 Other long term (current) drug therapy: Secondary | ICD-10-CM | POA: Diagnosis not present

## 2014-01-26 DIAGNOSIS — K625 Hemorrhage of anus and rectum: Secondary | ICD-10-CM | POA: Diagnosis not present

## 2014-01-26 DIAGNOSIS — IMO0002 Reserved for concepts with insufficient information to code with codable children: Secondary | ICD-10-CM

## 2014-01-26 LAB — CBC WITH DIFFERENTIAL/PLATELET
Basophils Absolute: 0.1 10*3/uL (ref 0.0–0.1)
Basophils Relative: 0.8 % (ref 0.0–3.0)
EOS PCT: 3.5 % (ref 0.0–5.0)
Eosinophils Absolute: 0.3 10*3/uL (ref 0.0–0.7)
HCT: 45.7 % (ref 36.0–46.0)
Hemoglobin: 15.1 g/dL — ABNORMAL HIGH (ref 12.0–15.0)
LYMPHS PCT: 24.8 % (ref 12.0–46.0)
Lymphs Abs: 2.1 10*3/uL (ref 0.7–4.0)
MCHC: 33.1 g/dL (ref 30.0–36.0)
MCV: 96 fl (ref 78.0–100.0)
MONO ABS: 0.8 10*3/uL (ref 0.1–1.0)
Monocytes Relative: 9.3 % (ref 3.0–12.0)
NEUTROS PCT: 61.6 % (ref 43.0–77.0)
Neutro Abs: 5.3 10*3/uL (ref 1.4–7.7)
PLATELETS: 222 10*3/uL (ref 150.0–400.0)
RBC: 4.76 Mil/uL (ref 3.87–5.11)
RDW: 14.3 % (ref 11.5–15.5)
WBC: 8.6 10*3/uL (ref 4.0–10.5)

## 2014-01-26 LAB — BASIC METABOLIC PANEL
BUN: 67 mg/dL — AB (ref 6–23)
CALCIUM: 9.7 mg/dL (ref 8.4–10.5)
CO2: 26 mEq/L (ref 19–32)
CREATININE: 2.8 mg/dL — AB (ref 0.4–1.2)
Chloride: 100 mEq/L (ref 96–112)
GFR: 18.29 mL/min — ABNORMAL LOW (ref >60.00)
Glucose, Bld: 169 mg/dL — ABNORMAL HIGH (ref 70–99)
Potassium: 4.2 mEq/L (ref 3.5–5.1)
Sodium: 135 mEq/L (ref 135–145)

## 2014-01-26 LAB — IBC PANEL
IRON: 80 ug/dL (ref 42–145)
Saturation Ratios: 23.2 % (ref 20.0–50.0)
TRANSFERRIN: 245.8 mg/dL (ref 212.0–360.0)

## 2014-01-26 LAB — HEMOGLOBIN A1C: Hgb A1c MFr Bld: 7.9 % — ABNORMAL HIGH (ref 4.6–6.5)

## 2014-01-26 NOTE — Patient Instructions (Addendum)
blood tests are being requested for you today.  We'll contact you with results.  Please see a GI specialist.  you will receive a phone call, about a day and time for an appointment.   check your blood sugar twice a day.  vary the time of day when you check, between before the 3 meals, and at bedtime.  also check if you have symptoms of your blood sugar being too high or too low.  please keep a record of the readings and bring it to your next appointment here.  You can write it on any piece of paper.  please call us sooner if your blood sugar goes below 70, or if you have a lot of readings over 200. On this type of insulin schedule, you should eat meals on a regular schedule.  If a meal is missed or significantly delayed, your blood sugar could go low. Please come back for a follow-up appointment in 3 months.

## 2014-01-26 NOTE — Progress Notes (Signed)
Subjective:    Patient ID: [name], female    DOB: 04-16-1951, 63 y.o.   MRN: [id number]  HPI The state of at least three ongoing medical problems is addressed today, with interval history of each noted here: pt returns for f/u of insulin-requiring DM (dx'ed 2001; she has been on insulin since soon after dx; she has moderate neuropathy of the lower extremities; she has associated nephropathy, CAD, PAD, and retinopathy).  she brings a record of her cbg's which i have reviewed today.  It varies from 44-150.  It is lowest in the afternoon, when she misses lunch.   Renal failure: denies dysuria. CHF: she denies sob Past Medical History  Diagnosis Date  . CHF (congestive heart failure)     EF 25%  . Cholelithiasis   . Diabetic retinopathy   . Diabetic nephropathy   . Hyperlipemia   . Diabetes mellitus   . Depression   . CAD (coronary artery disease)   . CVA (cerebral vascular accident)   . OSA (obstructive sleep apnea) 01/17/2011    Past Surgical History  Procedure Laterality Date  . Tonsillectomy    . Adenoidectomy    . Coronary artery bypass graft      X 5  . C-sections      x2  . Cholecystectomy    . Wisdom tooth extraction      x3  . Tracheostomy      History   Social History  . Marital Status: Divorced    Spouse Name: N/A    Number of Children: 1  . Years of Education: N/A   Occupational History  . Teacher     retired   Social History Main Topics  . Smoking status: Former Smoker -- 1 years    Types: Cigarettes    Quit date: 09/13/1968  . Smokeless tobacco: Not on file     Comment: occasional  . Alcohol Use: No  . Drug Use: No  . Sexual Activity: Not on file   Other Topics Concern  . Not on file   Social History Narrative  . No narrative on file    Current Outpatient Prescriptions on File Prior to Visit  Medication Sig Dispense Refill  . albuterol (PROVENTIL HFA;VENTOLIN HFA) 108 (90 BASE) MCG/ACT inhaler Inhale 2 puffs into the  lungs every 6 (six) hours as needed for wheezing or shortness of breath.  1 Inhaler  0  . Ascorbic Acid (VITAMIN C) 500 MG tablet 2 tablets by mouth once daily      . aspirin 81 MG tablet Take 81 mg by mouth daily.        . Calcium-Phosphorus-Vitamin D (CALCIUM GUMMIES PO) Take 1 tablet by mouth daily.        . carvedilol (COREG) 3.125 MG tablet Take 1 tablet (3.125 mg total) by mouth 2 (two) times daily.  180 tablet  3  . enalapril (VASOTEC) 10 MG tablet TAKE ONE TABLET BY MOUTH TWICE DAILY  60 tablet  2  . furosemide (LASIX) 80 MG tablet Take 80 mg by mouth daily.      . insulin NPH-regular Human (NOVOLIN 70/30) (70-30) 100 UNIT/ML injection 15 units with breakfast, and 5 units with the evening meal.      . Multiple Vitamin (MULTIVITAMIN) tablet Take 1 tablet by mouth daily.        . nitroGLYCERIN (NITROSTAT) 0.4 MG SL tablet Place 1 tablet (0.4 mg total) under the tongue every 5 (five) minutes as needed  for chest pain.  25 tablet  11  . NOVOLIN 70/30 RELION (70-30) 100 UNIT/ML injection INJECT 25 UNITS WITH BREAKFAST AND 20 UNITS WITH EVENING MEAL SUBCUTANEOUSLY  20 mL  1  . oxybutynin (DITROPAN-XL) 10 MG 24 hr tablet Take one tablet by mouth one time daily  30 tablet  9  . simvastatin (ZOCOR) 40 MG tablet TAKE ONE TABLET BY MOUTH ONE TIME DAILY   30 tablet  2  . traMADol-acetaminophen (ULTRACET) 37.5-325 MG per tablet TAKE ONE TABLET BY MOUTH EVERY FOUR HOURS AS NEEDED FOR PAIN   50 tablet  1  . [DISCONTINUED] oxybutynin (DITROPAN-XL) 10 MG 24 hr tablet Take 1 tablet (10 mg total) by mouth daily.  30 tablet  11   No current facility-administered medications on file prior to visit.    Allergies  Allergen Reactions  . Okra     Family History  Problem Relation Age of Onset  . Hypertension Mother   . Diabetes Mother   . Heart disease Mother   . Heart disease Father     Cardiovascular accident    BP 108/55  Pulse 88  Temp(Src) 98.4 F (36.9 C) (Oral)  Ht 5\' 2"  (1.575 m)  Wt 229  lb (103.874 kg)  BMI 41.87 kg/m2  SpO2 94%   Review of Systems She has alternating constipation and diarrhea.  She has intermittent minimal BRBPR.      Objective:   Physical Exam VITAL SIGNS:  See vs page.   GENERAL: no distress.   Pulses: dorsalis pedis intact bilat.   Feet: no deformity.  Trace bilat leg edema. Skin:  no ulcer on the feet.  normal color and temp. Neuro: sensation is intact to touch on the feet.     Lab Results  Component Value Date   CREATININE 2.8* 01/26/2014   BUN 67* 01/26/2014   NA 135 01/26/2014   K 4.2 01/26/2014   CL 100 01/26/2014   CO2 26 01/26/2014   [place] Lab Results  Component Value Date   WBC 8.6 01/26/2014   HGB 15.1* 01/26/2014   HCT 45.7 01/26/2014   MCV 96.0 01/26/2014   PLT 222.0 01/26/2014   Lab Results  Component Value Date   HGBA1C 7.9* 01/26/2014       Assessment & Plan:  BRBPR: She wants to see GI prior to any possible colonoscopy.   DM: this is the best control this pt should aim for, given this regimen, which does match insulin to her changing needs throughout the day CHF: well-controlled Renal failure: stable  we discussed code status.  pt requests full code, but would not want to be started or maintained on artificial life-support measures if there was not a reasonable chance of recovery   Patient is advised the following: Patient Instructions  blood tests are being requested for you today.  We'll contact you with results.  Please see a GI specialist.  you will receive a phone call, about a day and time for an appointment.   check your blood sugar twice a day.  vary the time of day when you check, between before the 3 meals, and at bedtime.  also check if you have symptoms of your blood sugar being too high or too low.  please keep a record of the readings and bring it to your next appointment here.  You can write it on any piece of paper.  please call us sooner if your blood sugar goes below 70, or if you have  a lot  of readings over 200. On this type of insulin schedule, you should eat meals on a regular schedule.  If a meal is missed or significantly delayed, your blood sugar could go low. Please come back for a follow-up appointment in 3 months.

## 2014-02-03 ENCOUNTER — Telehealth: Payer: Self-pay | Admitting: Endocrinology

## 2014-02-03 MED ORDER — ENALAPRIL MALEATE 10 MG PO TABS: ORAL_TABLET | ORAL | Status: DC

## 2014-02-03 NOTE — Telephone Encounter (Signed)
Patient would like a refill sent to her pharmacy  Rx: Analapril 10 mg   Pharmacy: [facility] [address]   Thank you

## 2014-02-03 NOTE — Telephone Encounter (Signed)
Rx sent to pharmacy   

## 2014-02-12 ENCOUNTER — Other Ambulatory Visit: Payer: Self-pay | Admitting: Endocrinology

## 2014-02-12 MED ORDER — FUROSEMIDE 80 MG PO TABS: 80.0000 mg | ORAL_TABLET | Freq: Every day | ORAL | Status: DC

## 2014-03-10 ENCOUNTER — Other Ambulatory Visit: Payer: Self-pay | Admitting: Endocrinology

## 2014-03-17 DIAGNOSIS — E11339 Type 2 diabetes mellitus with moderate nonproliferative diabetic retinopathy without macular edema: Secondary | ICD-10-CM | POA: Diagnosis not present

## 2014-03-21 ENCOUNTER — Other Ambulatory Visit: Payer: Self-pay | Admitting: Endocrinology

## 2014-04-28 ENCOUNTER — Ambulatory Visit (INDEPENDENT_AMBULATORY_CARE_PROVIDER_SITE_OTHER): Payer: Medicare Other | Admitting: Endocrinology

## 2014-04-28 VITALS — BP 128/88 | HR 81 | Temp 97.9°F | Ht 62.0 in | Wt 229.0 lb

## 2014-04-28 DIAGNOSIS — K625 Hemorrhage of anus and rectum: Secondary | ICD-10-CM | POA: Diagnosis not present

## 2014-04-28 DIAGNOSIS — E1029 Type 1 diabetes mellitus with other diabetic kidney complication: Secondary | ICD-10-CM

## 2014-04-28 DIAGNOSIS — IMO0002 Reserved for concepts with insufficient information to code with codable children: Secondary | ICD-10-CM

## 2014-04-28 DIAGNOSIS — E1065 Type 1 diabetes mellitus with hyperglycemia: Secondary | ICD-10-CM

## 2014-04-28 LAB — BASIC METABOLIC PANEL
BUN: 77 mg/dL — AB (ref 6–23)
CALCIUM: 9.3 mg/dL (ref 8.4–10.5)
CO2: 25 mEq/L (ref 19–32)
Chloride: 103 mEq/L (ref 96–112)
Creatinine, Ser: 2.78 mg/dL — ABNORMAL HIGH (ref 0.40–1.20)
GFR: 18.2 mL/min — AB (ref >60.00)
Glucose, Bld: 98 mg/dL (ref 70–99)
Potassium: 3.9 mEq/L (ref 3.5–5.1)
Sodium: 136 mEq/L (ref 135–145)

## 2014-04-28 LAB — HEMOGLOBIN A1C: Hgb A1c MFr Bld: 7.9 % — ABNORMAL HIGH (ref 4.6–6.5)

## 2014-04-28 NOTE — Patient Instructions (Addendum)
blood tests are being requested for you today.  We'll contact you with results.   check your blood sugar twice a day.  vary the time of day when you check, between before the 3 meals, and at bedtime.  also check if you have symptoms of your blood sugar being too high or too low.  please keep a record of the readings and bring it to your next appointment here.  You can write it on any piece of paper.  please call us sooner if your blood sugar goes below 70, or if you have a lot of readings over 200. On this type of insulin schedule, you should eat meals on a regular schedule.  If a meal is missed or significantly delayed, your blood sugar could go low. Please come back for a follow-up appointment in 2 months.

## 2014-04-28 NOTE — Progress Notes (Signed)
Subjective:    Patient ID: [name], female    DOB: [birth year], 64 y.o.   MRN: [id number]  HPI The state of at least three ongoing medical problems is addressed today, with interval history of each noted here: Pt returns for f/u of diabetes mellitus: DM type: Insulin-requiring type 2 Dx'ed: 2001 Complications: polyneuropathy, nephropathy, CAD, PAD, and retinopathy Therapy: insulin since soon after dx GDM: never DKA: never Severe hypoglycemia: never Pancreatitis: never Other: she is on a simple and inexpensive insulin regimen, at her request.   Interval history: no cbg record, but states cbg's are well-controlled.  pt states she feels well in general. Renal failure: denies edema.  CHF: she denies sob.   Past Medical History  Diagnosis Date  . CHF (congestive heart failure)     EF 25%  . Cholelithiasis   . Diabetic retinopathy   . Diabetic nephropathy   . Hyperlipemia   . Diabetes mellitus   . Depression   . CAD (coronary artery disease)   . CVA (cerebral vascular accident)   . OSA (obstructive sleep apnea) 01/17/2011    Past Surgical History  Procedure Laterality Date  . Tonsillectomy    . Adenoidectomy    . Coronary artery bypass graft      X 5  . C-sections      x2  . Cholecystectomy    . Wisdom tooth extraction      x3  . Tracheostomy      History   Social History  . Marital Status: Divorced    Spouse Name: N/A    Number of Children: 1  . Years of Education: N/A   Occupational History  . Teacher     retired   Social History Main Topics  . Smoking status: Former Smoker -- 1 years    Types: Cigarettes    Quit date: 09/13/1968  . Smokeless tobacco: Not on file     Comment: occasional  . Alcohol Use: No  . Drug Use: No  . Sexual Activity: Not on file   Other Topics Concern  . Not on file   Social History Narrative  . No narrative on file    Current Outpatient Prescriptions on File Prior to Visit  Medication Sig Dispense  Refill  . albuterol (PROVENTIL HFA;VENTOLIN HFA) 108 (90 BASE) MCG/ACT inhaler Inhale 2 puffs into the lungs every 6 (six) hours as needed for wheezing or shortness of breath. 1 Inhaler 0  . Ascorbic Acid (VITAMIN C) 500 MG tablet 2 tablets by mouth once daily    . aspirin 81 MG tablet Take 81 mg by mouth daily.      . Calcium-Phosphorus-Vitamin D (CALCIUM GUMMIES PO) Take 1 tablet by mouth daily.      . carvedilol (COREG) 3.125 MG tablet Take 1 tablet (3.125 mg total) by mouth 2 (two) times daily. (Patient taking differently: Take 3.125 mg by mouth once. ) 180 tablet 3  . enalapril (VASOTEC) 10 MG tablet TAKE ONE TABLET BY MOUTH TWICE DAILY 60 tablet 2  . furosemide (LASIX) 80 MG tablet Take 1 tablet (80 mg total) by mouth daily. 30 tablet 2  . insulin NPH-regular Human (NOVOLIN 70/30) (70-30) 100 UNIT/ML injection 15 units with breakfast, and 5 units with the evening meal.    . Multiple Vitamin (MULTIVITAMIN) tablet Take 1 tablet by mouth daily.      . nitroGLYCERIN (NITROSTAT) 0.4 MG SL tablet Place 1 tablet (0.4 mg total) under the tongue every 5 (five)  minutes as needed for chest pain. 25 tablet 11  . NOVOLIN 70/30 RELION (70-30) 100 UNIT/ML injection INJECT 25 UNITS WITH BREAKFAST AND 20 UNITS WITH EVENING MEAL SUBCUTANEOUSLY 20 mL 1  . oxybutynin (DITROPAN-XL) 10 MG 24 hr tablet TAKE ONE TABLET BY MOUTH ONE TIME DAILY  30 tablet 8  . simvastatin (ZOCOR) 40 MG tablet TAKE ONE TABLET BY MOUTH ONE TIME DAILY  30 tablet 1  . traMADol-acetaminophen (ULTRACET) 37.5-325 MG per tablet TAKE ONE TABLET BY MOUTH EVERY FOUR HOURS AS NEEDED  100 tablet 5  . [DISCONTINUED] oxybutynin (DITROPAN-XL) 10 MG 24 hr tablet Take 1 tablet (10 mg total) by mouth daily. 30 tablet 11   No current facility-administered medications on file prior to visit.    Allergies  Allergen Reactions  . Okra     Family History  Problem Relation Age of Onset  . Hypertension Mother   . Diabetes Mother   . Heart disease  Mother   . Heart disease Father     Cardiovascular accident    BP 128/88 mmHg  Pulse 81  Temp(Src) 97.9 F (36.6 C) (Oral)  Ht  (1.575 m)  Wt 229 lb (103.874 kg)  BMI 41.87 kg/m2  SpO2 96%    Review of Systems She denies hypoglycemia and weight change.     Objective:   Physical Exam VITAL SIGNS:  See vs page GENERAL: no distress LUNGS:  Clear to auscultation Pulses: dorsalis pedis intact bilat.   MSK: no deformity of the feet CV: no leg edema Skin:  no ulcer on the feet.  normal color and temp on the feet. Neuro: sensation is intact to touch on the feet.   Lab Results  Component Value Date   HGBA1C 7.9* 04/28/2014   Lab Results  Component Value Date   CREATININE 2.78* 04/28/2014   BUN 77* 04/28/2014   NA 136 04/28/2014   K 3.9 04/28/2014   CL 103 04/28/2014   CO2 25 04/28/2014        Assessment & Plan:  Renal failure, stable DM: mild exacerbation.  this is the best control this pt should aim for, given this regimen, which does match insulin to her changing needs throughout the day. CHF: well-controlled  Patient is advised the following: Patient Instructions  blood tests are being requested for you today.  We'll contact you with results.   check your blood sugar twice a day.  vary the time of day when you check, between before the 3 meals, and at bedtime.  also check if you have symptoms of your blood sugar being too high or too low.  please keep a record of the readings and bring it to your next appointment here.  You can write it on any piece of paper.  please call us sooner if your blood sugar goes below 70, or if you have a lot of readings over 200. On this type of insulin schedule, you should eat meals on a regular schedule.  If a meal is missed or significantly delayed, your blood sugar could go low. Please come back for a follow-up appointment in 2 months.

## 2014-05-21 ENCOUNTER — Other Ambulatory Visit: Payer: Self-pay | Admitting: Endocrinology

## 2014-05-31 ENCOUNTER — Other Ambulatory Visit: Payer: Self-pay | Admitting: Endocrinology

## 2014-06-19 ENCOUNTER — Other Ambulatory Visit: Payer: Self-pay | Admitting: Endocrinology

## 2014-06-21 ENCOUNTER — Other Ambulatory Visit: Payer: Self-pay | Admitting: *Deleted

## 2014-06-21 MED ORDER — SIMVASTATIN 40 MG PO TABS: 40.0000 mg | ORAL_TABLET | Freq: Every day | ORAL | Status: DC

## 2014-06-23 ENCOUNTER — Encounter: Payer: Self-pay | Admitting: Internal Medicine

## 2014-06-28 ENCOUNTER — Ambulatory Visit (INDEPENDENT_AMBULATORY_CARE_PROVIDER_SITE_OTHER): Payer: Medicare Other | Admitting: Endocrinology

## 2014-06-28 ENCOUNTER — Encounter: Payer: Self-pay | Admitting: Endocrinology

## 2014-06-28 ENCOUNTER — Ambulatory Visit
Admission: RE | Admit: 2014-06-28 | Discharge: 2014-06-28 | Disposition: A | Discharge status: Home / Self Care | Payer: Medicare Other | Source: Ambulatory Visit | Attending: Endocrinology | Admitting: Endocrinology

## 2014-06-28 VITALS — BP 100/79 | HR 59 | Temp 97.9°F | Ht 62.0 in | Wt 225.0 lb

## 2014-06-28 DIAGNOSIS — M79644 Pain in right finger(s): Secondary | ICD-10-CM | POA: Diagnosis not present

## 2014-06-28 DIAGNOSIS — N289 Disorder of kidney and ureter, unspecified: Secondary | ICD-10-CM | POA: Diagnosis not present

## 2014-06-28 DIAGNOSIS — E1029 Type 1 diabetes mellitus with other diabetic kidney complication: Secondary | ICD-10-CM | POA: Diagnosis not present

## 2014-06-28 DIAGNOSIS — M79641 Pain in right hand: Secondary | ICD-10-CM | POA: Diagnosis not present

## 2014-06-28 DIAGNOSIS — E1065 Type 1 diabetes mellitus with hyperglycemia: Principal | ICD-10-CM

## 2014-06-28 DIAGNOSIS — IMO0002 Reserved for concepts with insufficient information to code with codable children: Secondary | ICD-10-CM

## 2014-06-28 DIAGNOSIS — K625 Hemorrhage of anus and rectum: Secondary | ICD-10-CM

## 2014-06-28 DIAGNOSIS — E785 Hyperlipidemia, unspecified: Secondary | ICD-10-CM | POA: Diagnosis not present

## 2014-06-28 LAB — BASIC METABOLIC PANEL
BUN: 75 mg/dL — ABNORMAL HIGH (ref 6–23)
CALCIUM: 9.7 mg/dL (ref 8.4–10.5)
CO2: 26 meq/L (ref 19–32)
Chloride: 97 mEq/L (ref 96–112)
Creatinine, Ser: 2.89 mg/dL — ABNORMAL HIGH (ref 0.40–1.20)
GFR: 17.39 mL/min — AB (ref >60.00)
Glucose, Bld: 114 mg/dL — ABNORMAL HIGH (ref 70–99)
Potassium: 4.4 mEq/L (ref 3.5–5.1)
SODIUM: 132 meq/L — AB (ref 135–145)

## 2014-06-28 LAB — SEDIMENTATION RATE: SED RATE: 23 mm/h — AB (ref 0–22)

## 2014-06-28 LAB — HEMOGLOBIN A1C: HEMOGLOBIN A1C: 7.3 % — AB (ref 4.6–6.5)

## 2014-06-28 LAB — CBC WITH DIFFERENTIAL/PLATELET
BASOS ABS: 0 10*3/uL (ref 0.0–0.1)
BASOS PCT: 0.6 % (ref 0.0–3.0)
EOS ABS: 0.4 10*3/uL (ref 0.0–0.7)
EOS PCT: 5 % (ref 0.0–5.0)
HEMATOCRIT: 44.5 % (ref 36.0–46.0)
Hemoglobin: 15 g/dL (ref 12.0–15.0)
LYMPHS ABS: 2.4 10*3/uL (ref 0.7–4.0)
LYMPHS PCT: 31.5 % (ref 12.0–46.0)
MCHC: 33.8 g/dL (ref 30.0–36.0)
MCV: 95.2 fl (ref 78.0–100.0)
Monocytes Absolute: 0.9 10*3/uL (ref 0.1–1.0)
Monocytes Relative: 11.1 % (ref 3.0–12.0)
NEUTROS ABS: 4 10*3/uL (ref 1.4–7.7)
NEUTROS PCT: 51.8 % (ref 43.0–77.0)
PLATELETS: 212 10*3/uL (ref 150.0–400.0)
RBC: 4.67 Mil/uL (ref 3.87–5.11)
RDW: 13.3 % (ref 11.5–15.5)
WBC: 7.7 10*3/uL (ref 4.0–10.5)

## 2014-06-28 LAB — LIPID PANEL
CHOL/HDL RATIO: 5
CHOLESTEROL: 157 mg/dL (ref 0–200)
HDL: 30.1 mg/dL — AB (ref >39.00)
NonHDL: 126.9
Triglycerides: 227 mg/dL — ABNORMAL HIGH (ref 0.0–149.0)
VLDL: 45.4 mg/dL — AB (ref 0.0–40.0)

## 2014-06-28 LAB — LDL CHOLESTEROL, DIRECT: LDL DIRECT: 89 mg/dL

## 2014-06-28 LAB — TSH: TSH: 4.01 u[IU]/mL (ref 0.35–4.50)

## 2014-06-28 NOTE — Patient Instructions (Addendum)
blood tests and x-rays are being requested for you today.  We'll contact you with results.   check your blood sugar twice a day.  vary the time of day when you check, between before the 3 meals, and at bedtime.  also check if you have symptoms of your blood sugar being too high or too low.  please keep a record of the readings and bring it to your next appointment here.  You can write it on any piece of paper.  please call us sooner if your blood sugar goes below 70, or if you have a lot of readings over 200. On this type of insulin schedule, you should eat meals on a regular schedule.  If a meal is missed or significantly delayed, your blood sugar could go low. Please come back for a follow-up appointment in 2 months.   Please see a kidney specialist.  you will receive a phone call, about a day and time for an appointment.

## 2014-06-28 NOTE — Progress Notes (Signed)
Subjective:    Patient ID: [name], female    DOB: 1950/12/09, 64 y.o.   MRN: [id number]  HPI Pt returns for f/u of diabetes mellitus: DM type: Insulin-requiring type 2 Dx'ed: 2001 Complications: polyneuropathy, nephropathy, CAD, PAD, and retinopathy Therapy: insulin since soon after dx.   GDM: never DKA: never Severe hypoglycemia: never Pancreatitis: never Other: she is on a simple and inexpensive insulin regimen, at her request.   Interval history: no cbg record, but states cbg's are well-controlled.  Pt states 1 month of moderate pain at the ip joint of the right thumb.  No assoc numbness.  No injury. Past Medical History  Diagnosis Date  . CHF (congestive heart failure)     EF 25%  . Cholelithiasis   . Diabetic retinopathy   . Diabetic nephropathy   . Hyperlipemia   . Diabetes mellitus   . Depression   . CAD (coronary artery disease)   . CVA (cerebral vascular accident)   . OSA (obstructive sleep apnea) 01/17/2011    Past Surgical History  Procedure Laterality Date  . Tonsillectomy    . Adenoidectomy    . Coronary artery bypass graft      X 5  . C-sections      x2  . Cholecystectomy    . Wisdom tooth extraction      x3  . Tracheostomy      History   Social History  . Marital Status: Divorced    Spouse Name: N/A  . Number of Children: 1  . Years of Education: N/A   Occupational History  . Teacher     retired   Social History Main Topics  . Smoking status: Former Smoker -- 1 years    Types: Cigarettes    Quit date: 09/13/1968  . Smokeless tobacco: Not on file     Comment: occasional  . Alcohol Use: No  . Drug Use: No  . Sexual Activity: Not on file   Other Topics Concern  . Not on file   Social History Narrative    Current Outpatient Prescriptions on File Prior to Visit  Medication Sig Dispense Refill  . albuterol (PROVENTIL HFA;VENTOLIN HFA) 108 (90 BASE) MCG/ACT inhaler Inhale 2 puffs into the lungs every 6 (six) hours  as needed for wheezing or shortness of breath. 1 Inhaler 0  . Ascorbic Acid (VITAMIN C) 500 MG tablet 2 tablets by mouth once daily    . aspirin 81 MG tablet Take 81 mg by mouth daily.      . Calcium-Phosphorus-Vitamin D (CALCIUM GUMMIES PO) Take 1 tablet by mouth daily.      . carvedilol (COREG) 3.125 MG tablet Take 1 tablet (3.125 mg total) by mouth 2 (two) times daily. (Patient taking differently: Take 3.125 mg by mouth once. ) 180 tablet 3  . enalapril (VASOTEC) 10 MG tablet TAKE ONE TABLET BY MOUTH TWICE DAILY  60 tablet 1  . furosemide (LASIX) 80 MG tablet Take 1 tablet (80 mg total) by mouth daily. 30 tablet 2  . insulin NPH-regular Human (NOVOLIN 70/30) (70-30) 100 UNIT/ML injection 15 units with breakfast, and 5 units with the evening meal.    . Multiple Vitamin (MULTIVITAMIN) tablet Take 1 tablet by mouth daily.      . nitroGLYCERIN (NITROSTAT) 0.4 MG SL tablet Place 1 tablet (0.4 mg total) under the tongue every 5 (five) minutes as needed for chest pain. 25 tablet 11  . NOVOLIN 70/30 RELION (70-30) 100 UNIT/ML injection INJECT  25 UNITS WITH BREAKFAST AND 20 UNITS WITH EVENING MEAL SUBCUTANEOUSLY 20 mL 1  . oxybutynin (DITROPAN-XL) 10 MG 24 hr tablet TAKE ONE TABLET BY MOUTH ONE TIME DAILY  30 tablet 8  . simvastatin (ZOCOR) 40 MG tablet Take 1 tablet (40 mg total) by mouth daily. 30 tablet 0  . traMADol-acetaminophen (ULTRACET) 37.5-325 MG per tablet TAKE ONE TABLET BY MOUTH EVERY FOUR HOURS AS NEEDED  100 tablet 5   No current facility-administered medications on file prior to visit.    Allergies  Allergen Reactions  . Okra     Family History  Problem Relation Age of Onset  . Hypertension Mother   . Diabetes Mother   . Heart disease Mother   . Heart disease Father     Cardiovascular accident    BP 100/79 mmHg  Pulse 59  Temp(Src) 97.9 F (36.6 C) (Oral)  Ht  (1.575 m)  Wt 225 lb (102.059 kg)  BMI 41.14 kg/m2  Review of Systems She denies hypoglycemia and  weight change.      Objective:   Physical Exam VITAL SIGNS:  See vs page GENERAL: no distress Pulses: dorsalis pedis intact bilat.   MSK: no deformity of the feet.  CV: no leg edema. Skin:  no ulcer on the feet.  normal color and temp on the feet. Neuro: sensation is intact to touch on the feet, but decreased from normal. Ext: right thumb ip joint is not swollen or tender.     Lab Results  Component Value Date   HGBA1C 7.3* 06/28/2014   Lab Results  Component Value Date   CREATININE 2.89* 06/28/2014   BUN 75* 06/28/2014   NA 132* 06/28/2014   K 4.4 06/28/2014   CL 97 06/28/2014   CO2 26 06/28/2014  hand X-Ray: NAD    Assessment & Plan:  Renal failure, stable. DM: this is the best control this pt should aim for, given this regimen, which does match insulin to her changing needs throughout the day.  Finger pain, new, uncertain etiology   Patient is advised the following: Patient Instructions  blood tests and x-rays are being requested for you today.  We'll contact you with results.   check your blood sugar twice a day.  vary the time of day when you check, between before the 3 meals, and at bedtime.  also check if you have symptoms of your blood sugar being too high or too low.  please keep a record of the readings and bring it to your next appointment here.  You can write it on any piece of paper.  please call us sooner if your blood sugar goes below 70, or if you have a lot of readings over 200. On this type of insulin schedule, you should eat meals on a regular schedule.  If a meal is missed or significantly delayed, your blood sugar could go low. Please come back for a follow-up appointment in 2 months.   Please see a kidney specialist.  you will receive a phone call, about a day and time for an appointment.    addendum: Please continue the same insulin

## 2014-07-16 ENCOUNTER — Other Ambulatory Visit: Payer: Self-pay | Admitting: Endocrinology

## 2014-07-28 ENCOUNTER — Other Ambulatory Visit: Payer: Self-pay | Admitting: Endocrinology

## 2014-08-04 ENCOUNTER — Other Ambulatory Visit: Payer: Self-pay | Admitting: Endocrinology

## 2014-08-10 ENCOUNTER — Other Ambulatory Visit: Payer: Self-pay | Admitting: Endocrinology

## 2014-08-26 ENCOUNTER — Encounter: Payer: Self-pay | Admitting: Internal Medicine

## 2014-08-27 ENCOUNTER — Ambulatory Visit (INDEPENDENT_AMBULATORY_CARE_PROVIDER_SITE_OTHER): Payer: Medicare Other | Admitting: Endocrinology

## 2014-08-27 ENCOUNTER — Encounter: Payer: Self-pay | Admitting: Endocrinology

## 2014-08-27 VITALS — BP 126/74 | HR 76 | Temp 98.2°F | Wt 229.0 lb

## 2014-08-27 DIAGNOSIS — M79644 Pain in right finger(s): Secondary | ICD-10-CM

## 2014-08-27 DIAGNOSIS — E1065 Type 1 diabetes mellitus with hyperglycemia: Principal | ICD-10-CM

## 2014-08-27 DIAGNOSIS — IMO0002 Reserved for concepts with insufficient information to code with codable children: Secondary | ICD-10-CM

## 2014-08-27 DIAGNOSIS — E1029 Type 1 diabetes mellitus with other diabetic kidney complication: Secondary | ICD-10-CM

## 2014-08-27 DIAGNOSIS — Z0000 Encounter for general adult medical examination without abnormal findings: Secondary | ICD-10-CM | POA: Diagnosis not present

## 2014-08-27 LAB — BASIC METABOLIC PANEL
BUN: 54 mg/dL — AB (ref 6–23)
CHLORIDE: 101 meq/L (ref 96–112)
CO2: 27 mEq/L (ref 19–32)
Calcium: 9.8 mg/dL (ref 8.4–10.5)
Creatinine, Ser: 2.18 mg/dL — ABNORMAL HIGH (ref 0.40–1.20)
GFR: 24.06 mL/min — ABNORMAL LOW (ref >60.00)
GLUCOSE: 120 mg/dL — AB (ref 70–99)
Potassium: 4.2 mEq/L (ref 3.5–5.1)
Sodium: 135 mEq/L (ref 135–145)

## 2014-08-27 LAB — HEMOGLOBIN A1C: HEMOGLOBIN A1C: 6.8 % — AB (ref 4.6–6.5)

## 2014-08-27 NOTE — Patient Instructions (Addendum)
good diet and exercise significantly improve the control of your diabetes.  please let me know if you wish to be referred to a dietician.  high blood sugar is very risky to your health.  you should see an eye doctor and dentist every year.  It is very important to get all recommended vaccinations.  please consider these measures for your health:  minimize alcohol.  do not use tobacco products.  have a colonoscopy at least every 10 years from age 65.  Women should have an annual mammogram from age 65.  keep firearms safely stored.  always use seat belts.  have working smoke alarms in your home.  see an eye doctor and dentist regularly.  never drive under the influence of alcohol or drugs (including prescription drugs).  those with fair skin should take precautions against the sun. it is critically important to prevent falling down (keep floor areas well-lit, dry, and free of loose objects.  If you have a cane, walker, or wheelchair, you should use it, even for short trips around the house.  Also, try not to rush).   blood tests are requested for you today.  We'll let you know about the results. Please come back for a follow-up appointment in 3 months.   Please see a hand specialist.  you will receive a phone call, about a day and time for an appointment

## 2014-08-27 NOTE — Progress Notes (Signed)
we discussed code status.  pt requests full code, but would not want to be started or maintained on artificial life-support measures if there was not a reasonable chance of recovery 

## 2014-08-27 NOTE — Progress Notes (Signed)
Subjective:    Patient ID: [name], female    DOB: 1950/09/14, 64 y.o.   MRN: [id number]  HPI Pt returns for f/u of diabetes mellitus: DM type: Insulin-requiring type 2 Dx'ed: 2001 Complications: polyneuropathy, nephropathy, CAD, PAD, and retinopathy Therapy: insulin since soon after dx.   GDM: never DKA: never Severe hypoglycemia: never Pancreatitis: never Other: she is on a simple and inexpensive insulin regimen, at her request.   Interval history: no cbg record, but states cbg's are well-controlled.  She takes 25 units qam, and none in the evening.  She denies hypoglycemia.   Finger pain persists. Past Medical History  Diagnosis Date  . CHF (congestive heart failure)     EF 25%  . Cholelithiasis   . Diabetic retinopathy   . Diabetic nephropathy   . Hyperlipemia   . Diabetes mellitus   . Depression   . CAD (coronary artery disease)   . CVA (cerebral vascular accident)   . OSA (obstructive sleep apnea) 01/17/2011    Past Surgical History  Procedure Laterality Date  . Tonsillectomy    . Adenoidectomy    . Coronary artery bypass graft      X 5  . C-sections      x2  . Cholecystectomy    . Wisdom tooth extraction      x3  . Tracheostomy      History   Social History  . Marital Status: Divorced    Spouse Name: N/A  . Number of Children: 1  . Years of Education: N/A   Occupational History  . Teacher     retired   Social History Main Topics  . Smoking status: Former Smoker -- 1 years    Types: Cigarettes    Quit date: 09/13/1968  . Smokeless tobacco: Not on file     Comment: occasional  . Alcohol Use: No  . Drug Use: No  . Sexual Activity: Not on file   Other Topics Concern  . Not on file   Social History Narrative    Current Outpatient Prescriptions on File Prior to Visit  Medication Sig Dispense Refill  . albuterol (PROVENTIL HFA;VENTOLIN HFA) 108 (90 BASE) MCG/ACT inhaler Inhale 2 puffs into the lungs every 6 (six) hours as  needed for wheezing or shortness of breath. 1 Inhaler 0  . Ascorbic Acid (VITAMIN C) 500 MG tablet 2 tablets by mouth once daily    . aspirin 81 MG tablet Take 81 mg by mouth daily.      . Calcium-Phosphorus-Vitamin D (CALCIUM GUMMIES PO) Take 1 tablet by mouth daily.      . carvedilol (COREG) 3.125 MG tablet Take 1 tablet (3.125 mg total) by mouth 2 (two) times daily. 180 tablet 3  . enalapril (VASOTEC) 10 MG tablet TAKE ONE TABLET BY MOUTH TWICE DAILY 60 tablet 1  . furosemide (LASIX) 80 MG tablet TAKE ONE TABLET BY MOUTH ONE TIME DAILY 30 tablet 2  . insulin NPH-regular Human (NOVOLIN 70/30) (70-30) 100 UNIT/ML injection 20 Units daily with breakfast.     . Multiple Vitamin (MULTIVITAMIN) tablet Take 1 tablet by mouth daily.      . nitroGLYCERIN (NITROSTAT) 0.4 MG SL tablet Place 1 tablet (0.4 mg total) under the tongue every 5 (five) minutes as needed for chest pain. 25 tablet 11  . oxybutynin (DITROPAN-XL) 10 MG 24 hr tablet TAKE ONE TABLET BY MOUTH ONE TIME DAILY  30 tablet 8  . simvastatin (ZOCOR) 40 MG tablet TAKE ONE  TABLET BY MOUTH ONE TIME DAILY 30 tablet 0  . traMADol-acetaminophen (ULTRACET) 37.5-325 MG per tablet TAKE ONE TABLET BY MOUTH EVERY FOUR HOURS AS NEEDED  100 tablet 5   No current facility-administered medications on file prior to visit.    Allergies  Allergen Reactions  . Okra     Family History  Problem Relation Age of Onset  . Hypertension Mother   . Diabetes Mother   . Heart disease Mother   . Heart disease Father     Cardiovascular accident    BP 126/74 mmHg  Pulse 76  Temp(Src) 98.2 F (36.8 C) (Oral)  Wt 229 lb (103.874 kg)  SpO2 95%     Review of Systems Denies weight change and sob    Objective:   Physical Exam VITAL SIGNS:  See vs page GENERAL: no distress Pulses: dorsalis pedis intact bilat.   MSK: no deformity of the feet.  CV: no leg edema.  Skin: no ulcer on the feet. normal color and temp on the feet.  Old healed surgical  scar (vein harvest) at the right leg Neuro: sensation is intact to touch on the feet, but decreased from normal.   Lab Results  Component Value Date   HGBA1C 6.8* 08/27/2014   Lab Results  Component Value Date   CREATININE 2.18* 08/27/2014   BUN 54* 08/27/2014   NA 135 08/27/2014   K 4.2 08/27/2014   CL 101 08/27/2014   CO2 27 08/27/2014       Assessment & Plan:  DM: overcontrolled: reduce the insulin to 20 units daily (it is best to take with the first meal of the day).  Finger pain: persistent: Please see a hand specialist.  you will receive a phone call, about a day and time for an appointment Renal failure: improved: as pt has not yet been given a nephrol appt, we'll follow for now.   Subjective:   Patient here for Medicare annual wellness visit and management of other chronic and acute problems.     Risk factors: advanced age    Roster of Physicians Providing Medical Care to Patient:  See "snapshot"   Activities of Daily Living: In your present state of health, do you have any difficulty performing the following activities?:  Preparing food and eating?: No  Bathing yourself: No  Getting dressed: No  Using the toilet:No  Moving around from place to place: No  In the past year have you fallen or had a near fall?:No    Home Safety: Has smoke detector and wears seat belts. No firearms. No excess sun exposure.  Diet and Exercise  Current exercise habits: pt says limited by health problems. Dietary issues discussed: pt reports a healthy diet.    Depression Screen  Q1: Over the past two weeks, have you felt down, depressed or hopeless?no  Q2: Over the past two weeks, have you felt little interest or pleasure in doing things? no   The following portions of the patient's history were reviewed and updated as appropriate: allergies, current medications, past family history, past medical history, past social history, past surgical history and problem list.   Review of  Systems  Denies hearing loss, and visual loss Objective:   Vision:  Sees opthalmologist Hearing: grossly normal Body mass index:  See vs page Msk: pt slowly performs "get-up-and-go" from a sitting position Cognitive Impairment Assessment: cognition, memory and judgment appear normal.  remembers 2/3 at 5 minutes (? Effort).  excellent recall.  can easily read  and write a sentence.  alert and oriented x 3   Assessment:   Medicare wellness utd on preventive parameters.    Plan:   During the course of the visit the patient was educated and counseled about appropriate screening and preventive services including:        Fall prevention   Screening mammography  Bone densitometry screening  Diabetes screening  Nutrition counseling   Vaccines / LABS Zostavax / Pneumococcal Vaccine  today   Patient Instructions (the written plan) was given to the patient.

## 2014-08-30 ENCOUNTER — Telehealth: Payer: Self-pay

## 2014-08-30 MED ORDER — FUROSEMIDE 80 MG PO TABS: 80.0000 mg | ORAL_TABLET | Freq: Two times a day (BID) | ORAL | Status: DC

## 2014-08-30 NOTE — Telephone Encounter (Signed)
Pt's pharmacy contacted the office and requested clarification on the patients Furosemide. Pt advised the pharmacy she is taking 80mg  of Furosemide BID. Please advise if this is correct. Current medication list stated the pt is taking 80 mg 1 time per day. Thanks!

## 2014-08-30 NOTE — Telephone Encounter (Signed)
Ok, i resent rx

## 2014-09-11 ENCOUNTER — Other Ambulatory Visit: Payer: Self-pay | Admitting: Endocrinology

## 2014-09-15 ENCOUNTER — Other Ambulatory Visit: Payer: Self-pay | Admitting: Endocrinology

## 2014-09-18 ENCOUNTER — Other Ambulatory Visit: Payer: Self-pay | Admitting: Endocrinology

## 2014-09-20 DIAGNOSIS — M65311 Trigger thumb, right thumb: Secondary | ICD-10-CM | POA: Diagnosis not present

## 2014-09-28 ENCOUNTER — Other Ambulatory Visit: Payer: Self-pay | Admitting: Endocrinology

## 2014-09-30 ENCOUNTER — Other Ambulatory Visit: Payer: Self-pay | Admitting: Endocrinology

## 2014-10-11 ENCOUNTER — Other Ambulatory Visit: Payer: Self-pay

## 2014-10-14 ENCOUNTER — Telehealth: Payer: Self-pay

## 2014-10-14 NOTE — Telephone Encounter (Signed)
I am informing you that this pt was contacted several times via [facility] and she has not returned call for appt.They have now stated that after so many attempts her referral is now expired.

## 2014-10-16 ENCOUNTER — Other Ambulatory Visit: Payer: Self-pay | Admitting: Endocrinology

## 2014-11-17 ENCOUNTER — Other Ambulatory Visit: Payer: Self-pay | Admitting: Endocrinology

## 2014-11-29 ENCOUNTER — Ambulatory Visit: Payer: Medicare Other | Admitting: Endocrinology

## 2014-12-02 ENCOUNTER — Other Ambulatory Visit: Payer: Self-pay | Admitting: Endocrinology

## 2015-01-01 ENCOUNTER — Other Ambulatory Visit: Payer: Self-pay | Admitting: Endocrinology

## 2015-01-06 ENCOUNTER — Other Ambulatory Visit: Payer: Self-pay | Admitting: Endocrinology

## 2015-01-15 ENCOUNTER — Other Ambulatory Visit: Payer: Self-pay | Admitting: Endocrinology

## 2015-01-17 NOTE — Telephone Encounter (Signed)
Please refill x 1 Ov is due  

## 2015-01-17 NOTE — Telephone Encounter (Signed)
Please advise if ok to refill. Medication is listed under a historical provider.  Thanks!  

## 2015-01-17 NOTE — Telephone Encounter (Signed)
Rx sent and appointment letter mailed to the pt.  

## 2015-01-27 ENCOUNTER — Other Ambulatory Visit: Payer: Self-pay | Admitting: Endocrinology

## 2015-02-02 ENCOUNTER — Other Ambulatory Visit: Payer: Self-pay | Admitting: Endocrinology

## 2015-02-02 NOTE — Telephone Encounter (Signed)
Please refill x 1 Ov is due  

## 2015-02-14 ENCOUNTER — Other Ambulatory Visit: Payer: Self-pay | Admitting: Endocrinology

## 2015-03-06 ENCOUNTER — Other Ambulatory Visit: Payer: Self-pay | Admitting: Endocrinology

## 2015-03-06 NOTE — Telephone Encounter (Signed)
Please review prescription

## 2015-03-19 ENCOUNTER — Other Ambulatory Visit: Payer: Self-pay | Admitting: Endocrinology

## 2015-03-19 NOTE — Telephone Encounter (Signed)
Please review

## 2015-04-05 ENCOUNTER — Other Ambulatory Visit: Payer: Self-pay | Admitting: Endocrinology

## 2015-04-19 ENCOUNTER — Other Ambulatory Visit: Payer: Self-pay | Admitting: Endocrinology

## 2015-04-21 ENCOUNTER — Telehealth: Payer: Self-pay | Admitting: Endocrinology

## 2015-04-21 MED ORDER — INSULIN NPH ISOPHANE & REGULAR (70-30) 100 UNIT/ML ~~LOC~~ SUSP: SUBCUTANEOUS | Status: DC

## 2015-04-21 NOTE — Telephone Encounter (Signed)
Patient need refill of her medication Novolin 70/30 [facility] [place]

## 2015-04-21 NOTE — Telephone Encounter (Signed)
Rx sent per pt's request.  

## 2015-05-14 ENCOUNTER — Other Ambulatory Visit: Payer: Self-pay | Admitting: Endocrinology

## 2015-05-25 ENCOUNTER — Other Ambulatory Visit: Payer: Self-pay | Admitting: Endocrinology

## 2015-06-24 ENCOUNTER — Other Ambulatory Visit: Payer: Self-pay | Admitting: Endocrinology

## 2015-06-25 ENCOUNTER — Other Ambulatory Visit: Payer: Self-pay | Admitting: Endocrinology

## 2015-06-29 ENCOUNTER — Other Ambulatory Visit: Payer: Self-pay | Admitting: Endocrinology

## 2015-07-02 ENCOUNTER — Other Ambulatory Visit: Payer: Self-pay | Admitting: Endocrinology

## 2015-07-19 ENCOUNTER — Telehealth: Payer: Self-pay | Admitting: Endocrinology

## 2015-07-19 NOTE — Telephone Encounter (Signed)
Ov is due i would need to see this in order to see what specialist you needed

## 2015-07-19 NOTE — Telephone Encounter (Signed)
Patient broke a tooth and it ripped up her tongue, she referred to a dentist. Please advise

## 2015-07-19 NOTE — Telephone Encounter (Signed)
See note below and please advise, Thanks! 

## 2015-07-20 NOTE — Telephone Encounter (Signed)
I contacted the pt and advised of note below. Pt voiced understanding and stated she would contact her daughter and have her call us back to make an appointment because she would be the one bringing her to her appointment.

## 2015-07-22 ENCOUNTER — Encounter: Payer: Self-pay | Admitting: Endocrinology

## 2015-07-22 ENCOUNTER — Ambulatory Visit (INDEPENDENT_AMBULATORY_CARE_PROVIDER_SITE_OTHER): Payer: Medicare Other | Admitting: Endocrinology

## 2015-07-22 VITALS — BP 132/74 | HR 83 | Temp 97.8°F | Ht 62.0 in | Wt 224.0 lb

## 2015-07-22 DIAGNOSIS — Z794 Long term (current) use of insulin: Secondary | ICD-10-CM

## 2015-07-22 DIAGNOSIS — N183 Chronic kidney disease, stage 3 unspecified: Secondary | ICD-10-CM

## 2015-07-22 DIAGNOSIS — E1122 Type 2 diabetes mellitus with diabetic chronic kidney disease: Secondary | ICD-10-CM | POA: Diagnosis not present

## 2015-07-22 DIAGNOSIS — R220 Localized swelling, mass and lump, head: Secondary | ICD-10-CM | POA: Diagnosis not present

## 2015-07-22 DIAGNOSIS — K140 Glossitis: Secondary | ICD-10-CM | POA: Diagnosis not present

## 2015-07-22 LAB — POCT GLYCOSYLATED HEMOGLOBIN (HGB A1C): Hemoglobin A1C: 6.6

## 2015-07-22 MED ORDER — INSULIN NPH ISOPHANE & REGULAR (70-30) 100 UNIT/ML ~~LOC~~ SUSP: 15.0000 [IU] | Freq: Every day | SUBCUTANEOUS | Status: DC

## 2015-07-22 NOTE — Addendum Note (Signed)
Addended by: Ann MakiBAILEY, Maleigh Bagot T on: 07/22/2015 11:37 AM   Modules accepted: Orders

## 2015-07-22 NOTE — Patient Instructions (Addendum)
Please reduce the insulin to 15 units each morning. good diet and exercise significantly improve the control of your diabetes.  please let me know if you wish to be referred to a dietician.  high blood sugar is very risky to your health.  you should see an eye doctor and dentist every year.  It is very important to get all recommended vaccinations.  Please come back for a follow-up appointment in 3 months.   check your blood sugar twice a day.  vary the time of day when you check, between before the 3 meals, and at bedtime.  also check if you have symptoms of your blood sugar being too high or too low.  please keep a record of the readings and bring it to your next appointment here (or you can bring the meter itself).  You can write it on any piece of paper.  please call us sooner if your blood sugar goes below 70, or if you have a lot of readings over 200. Please see an ENT specialist, today if possible.

## 2015-07-22 NOTE — Progress Notes (Signed)
Subjective:    Patient ID: [name], female    DOB: May 16, 1950, 65 y.o.   MRN: [id number]  HPI  Pt returns for f/u of diabetes mellitus: DM type: Insulin-requiring type 2 Dx'ed: 2001 Complications: polyneuropathy, renal insufficiency, CAD, PAD, and retinopathy Therapy: insulin since soon after dx.   GDM: never DKA: never Severe hypoglycemia: never.  Pancreatitis: never.   Other: she is on a simple and inexpensive insulin regimen, at her request.   Interval history: no cbg record, but states cbg's are well-controlled.  She takes 20 units qam, and none in the evening.  She denies hypoglycemia.   Pt states 2 weeks of moderate pain at the right lower dental area.  This started when she bit down, and broke a tooth.  She has assoc pain and swelling of the tongue.   Past Medical History  Diagnosis Date  . CHF (congestive heart failure) (HCC)     EF 25%  . Cholelithiasis   . Diabetic retinopathy   . Diabetic nephropathy (HCC)   . Hyperlipemia   . Diabetes mellitus   . Depression   . CAD (coronary artery disease)   . CVA (cerebral vascular accident) (HCC)   . OSA (obstructive sleep apnea) 01/17/2011    Past Surgical History  Procedure Laterality Date  . Tonsillectomy    . Adenoidectomy    . Coronary artery bypass graft      X 5  . C-sections      x2  . Cholecystectomy    . Wisdom tooth extraction      x3  . Tracheostomy      Social History   Social History  . Marital Status: Divorced    Spouse Name: N/A  . Number of Children: 1  . Years of Education: N/A   Occupational History  . Teacher     retired   Social History Main Topics  . Smoking status: Former Smoker -- 1 years    Types: Cigarettes    Quit date: 09/13/1968  . Smokeless tobacco: Not on file     Comment: occasional  . Alcohol Use: No  . Drug Use: No  . Sexual Activity: Not on file   Other Topics Concern  . Not on file   Social History Narrative    Current Outpatient  Prescriptions on File Prior to Visit  Medication Sig Dispense Refill  . albuterol (PROVENTIL HFA;VENTOLIN HFA) 108 (90 BASE) MCG/ACT inhaler Inhale 2 puffs into the lungs every 6 (six) hours as needed for wheezing or shortness of breath. 1 Inhaler 0  . aspirin 81 MG tablet Take 81 mg by mouth daily.      . carvedilol (COREG) 3.125 MG tablet TAKE ONE TABLET BY MOUTH TWICE DAILY 180 tablet 0  . enalapril (VASOTEC) 10 MG tablet TAKE ONE TABLET BY MOUTH TWICE DAILY 60 tablet 1  . furosemide (LASIX) 80 MG tablet TAKE ONE TABLET BY MOUTH ONE TIME DAILY 30 tablet 2  . nitroGLYCERIN (NITROSTAT) 0.4 MG SL tablet Place 1 tablet (0.4 mg total) under the tongue every 5 (five) minutes as needed for chest pain. 25 tablet 11  . oxybutynin (DITROPAN-XL) 10 MG 24 hr tablet TAKE ONE TABLET BY MOUTH ONE TIME DAILY 30 tablet 0  . simvastatin (ZOCOR) 40 MG tablet TAKE ONE TABLET BY MOUTH ONE TIME DAILY 30 tablet 0  . Ascorbic Acid (VITAMIN C) 500 MG tablet Reported on 07/22/2015    . Calcium-Phosphorus-Vitamin D (CALCIUM GUMMIES PO) Take 1 tablet  by mouth daily. Reported on 07/22/2015    . Multiple Vitamin (MULTIVITAMIN) tablet Take 1 tablet by mouth daily. Reported on 07/22/2015    . traMADol-acetaminophen (ULTRACET) 37.5-325 MG per tablet TAKE ONE TABLET BY MOUTH EVERY FOUR HOURS AS NEEDED  (Patient not taking: Reported on 07/22/2015) 100 tablet 5   No current facility-administered medications on file prior to visit.    Allergies  Allergen Reactions  . Okra     Family History  Problem Relation Age of Onset  . Hypertension Mother   . Diabetes Mother   . Heart disease Mother   . Heart disease Father     Cardiovascular accident    BP 132/74 mmHg  Pulse 83  Temp(Src) 97.8 F (36.6 C) (Oral)  Ht  (1.575 m)  Wt 224 lb (101.606 kg)  BMI 40.96 kg/m2  SpO2 94%  Review of Systems Denies fever and sob.     Objective:   Physical Exam VITAL SIGNS:  See vs page GENERAL: no distress Mouth: there is  moderate swelling and tenderness of the right lower dental area, tongue, and adjacent.   Pulses: dorsalis pedis intact bilat.   MSK: no deformity of the feet CV: trace bilat leg edema Skin:  no ulcer on the feet.  normal color and temp on the feet. Neuro: sensation is intact to touch on the feet, but decreased from normal   A1c=6.6%    Assessment & Plan:  DM: overcontrolled, given this regimen, which does match insulin to her changing needs throughout the day. Facial swelling, new, ? abscess.  Patient is advised the following: Patient Instructions  Please reduce the insulin to 15 units each morning. good diet and exercise significantly improve the control of your diabetes.  please let me know if you wish to be referred to a dietician.  high blood sugar is very risky to your health.  you should see an eye doctor and dentist every year.  It is very important to get all recommended vaccinations.  Please come back for a follow-up appointment in 3 months.   check your blood sugar twice a day.  vary the time of day when you check, between before the 3 meals, and at bedtime.  also check if you have symptoms of your blood sugar being too high or too low.  please keep a record of the readings and bring it to your next appointment here (or you can bring the meter itself).  You can write it on any piece of paper.  please call us sooner if your blood sugar goes below 70, or if you have a lot of readings over 200. Please see an ENT specialist, today if possible.

## 2015-07-25 ENCOUNTER — Telehealth: Payer: Self-pay | Admitting: Endocrinology

## 2015-07-25 MED ORDER — INSULIN NPH ISOPHANE & REGULAR (70-30) 100 UNIT/ML ~~LOC~~ SUSP: 15.0000 [IU] | Freq: Every day | SUBCUTANEOUS | Status: DC

## 2015-07-25 NOTE — Telephone Encounter (Signed)
Please read message below.  

## 2015-07-25 NOTE — Telephone Encounter (Signed)
Rx changed to Humulin 70/30.

## 2015-07-25 NOTE — Telephone Encounter (Signed)
[name] OhmChris for [facility] stated a E script was sent for Novolin 70/30 he asked can it be switch it out to Humulin 70/30 please advise [phone]

## 2015-08-03 ENCOUNTER — Other Ambulatory Visit: Payer: Self-pay | Admitting: Endocrinology

## 2015-08-24 ENCOUNTER — Other Ambulatory Visit: Payer: Self-pay | Admitting: Endocrinology

## 2015-09-01 ENCOUNTER — Other Ambulatory Visit: Payer: Self-pay | Admitting: Endocrinology

## 2015-09-05 ENCOUNTER — Encounter: Payer: Self-pay | Admitting: Endocrinology

## 2015-09-05 ENCOUNTER — Ambulatory Visit (INDEPENDENT_AMBULATORY_CARE_PROVIDER_SITE_OTHER): Payer: Medicare Other | Admitting: Endocrinology

## 2015-09-05 VITALS — BP 134/86 | HR 81 | Temp 97.7°F | Ht 62.0 in | Wt 245.0 lb

## 2015-09-05 DIAGNOSIS — N183 Chronic kidney disease, stage 3 (moderate): Secondary | ICD-10-CM | POA: Diagnosis not present

## 2015-09-05 DIAGNOSIS — R0600 Dyspnea, unspecified: Secondary | ICD-10-CM

## 2015-09-05 DIAGNOSIS — N289 Disorder of kidney and ureter, unspecified: Secondary | ICD-10-CM

## 2015-09-05 DIAGNOSIS — Z0000 Encounter for general adult medical examination without abnormal findings: Secondary | ICD-10-CM | POA: Diagnosis not present

## 2015-09-05 DIAGNOSIS — Z794 Long term (current) use of insulin: Secondary | ICD-10-CM

## 2015-09-05 DIAGNOSIS — I509 Heart failure, unspecified: Secondary | ICD-10-CM | POA: Diagnosis not present

## 2015-09-05 DIAGNOSIS — E1122 Type 2 diabetes mellitus with diabetic chronic kidney disease: Secondary | ICD-10-CM

## 2015-09-05 MED ORDER — METOLAZONE 2.5 MG PO TABS: 2.5000 mg | ORAL_TABLET | Freq: Every day | ORAL | Status: DC

## 2015-09-05 NOTE — Progress Notes (Signed)
we discussed code status.  pt requests DNR 

## 2015-09-05 NOTE — Patient Instructions (Addendum)
blood tests are requested for you today.  We'll let you know about the results. Please see a kidney specialist.  Also, please go back to see Dr Antoine PocheHochrein.  you will receive a phone call, about days and times for these 2 appointments good diet and exercise significantly improve the control of your diabetes.  please let me know if you wish to be referred to a dietician.  high blood sugar is very risky to your health.  you should see an eye doctor and dentist every year.  It is very important to get all recommended vaccinations.  please consider these measures for your health:  minimize alcohol.  do not use tobacco products.  have a colonoscopy at least every 10 years from age 66.  Women should have an annual mammogram from age 66.  keep firearms safely stored.  always use seat belts.  have working smoke alarms in your home.  see an eye doctor and dentist regularly.  never drive under the influence of alcohol or drugs (including prescription drugs).  those with fair skin should take precautions against the sun. it is critically important to prevent falling down (keep floor areas well-lit, dry, and free of loose objects.  If you have a cane, walker, or wheelchair, you should use it, even for short trips around the house.  Wear flat-soled shoes.  Also, try not to rush) Please come back for a follow-up appointment in 1 week.   check your blood sugar twice a day.  vary the time of day when you check, between before the 3 meals, and at bedtime.  also check if you have symptoms of your blood sugar being too high or too low.  please keep a record of the readings and bring it to your next appointment here (or you can bring the meter itself).  You can write it on any piece of paper.  please call us sooner if your blood sugar goes below 70, or if you have a lot of readings over 200.   It is important to never miss the furosemide.  In addition, for the next 3 days, take metolozone once a day (this helps the furosemide).  i  have sent a prescription to your pharmacy.

## 2015-09-05 NOTE — Progress Notes (Signed)
Subjective:    Patient ID: [name], female    DOB: [birth year], 65 y.o.   MRN: [id number]  HPI Pt returns for f/u of diabetes mellitus: DM type: Insulin-requiring type 2 Dx'ed: 2001 Complications: polyneuropathy, renal insufficiency, CAD, PAD, and retinopathy Therapy: insulin since soon after dx.   GDM: never DKA: never Severe hypoglycemia: never.  Pancreatitis: never.   Other: she is on a simple and inexpensive insulin regimen, at her request.   Interval history: she brings a record of her cbg's which i have reviewed today.  All are in the 100's.  There is no trend throughout the day.  She takes 15 units qam, and none in the evening.  She denies hypoglycemia.   Pt states 1 month of moderate increase of chronic swelling of the legs, and assoc doe.  She says she sometimes misses the lasix.  Past Medical History  Diagnosis Date  . CHF (congestive heart failure) (HCC)     EF 25%  . Cholelithiasis   . Diabetic retinopathy   . Diabetic nephropathy (HCC)   . Hyperlipemia   . Diabetes mellitus   . Depression   . CAD (coronary artery disease)   . CVA (cerebral vascular accident) (HCC)   . OSA (obstructive sleep apnea) 01/17/2011    Past Surgical History  Procedure Laterality Date  . Tonsillectomy    . Adenoidectomy    . Coronary artery bypass graft      X 5  . C-sections      x2  . Cholecystectomy    . Wisdom tooth extraction      x3  . Tracheostomy      Social History   Social History  . Marital Status: Divorced    Spouse Name: N/A  . Number of Children: 1  . Years of Education: N/A   Occupational History  . Teacher     retired   Social History Main Topics  . Smoking status: Former Smoker -- 1 years    Types: Cigarettes    Quit date: 09/13/1968  . Smokeless tobacco: Not on file     Comment: occasional  . Alcohol Use: No  . Drug Use: No  . Sexual Activity: Not on file   Other Topics Concern  . Not on file   Social History Narrative     Current Outpatient Prescriptions on File Prior to Visit  Medication Sig Dispense Refill  . aspirin 81 MG tablet Take 81 mg by mouth daily.      . Calcium-Phosphorus-Vitamin D (CALCIUM GUMMIES PO) Take 1 tablet by mouth daily. Reported on 07/22/2015    . carvedilol (COREG) 3.125 MG tablet TAKE ONE TABLET BY MOUTH TWICE DAILY 180 tablet 0  . enalapril (VASOTEC) 10 MG tablet TAKE ONE TABLET BY MOUTH TWICE DAILY 60 tablet 1  . furosemide (LASIX) 80 MG tablet TAKE ONE TABLET BY MOUTH ONE TIME DAILY (Patient taking differently: TAKE ONE TABLET BY MOUTH ON TWICE DAILY) 30 tablet 2  . insulin NPH-regular Human (HUMULIN 70/30) (70-30) 100 UNIT/ML injection Inject 15 Units into the skin daily with breakfast. 10 mL 2  . nitroGLYCERIN (NITROSTAT) 0.4 MG SL tablet Place 1 tablet (0.4 mg total) under the tongue every 5 (five) minutes as needed for chest pain. 25 tablet 11  . oxybutynin (DITROPAN-XL) 10 MG 24 hr tablet TAKE ONE TABLET BY MOUTH ONE TIME DAILY 30 tablet 0  . simvastatin (ZOCOR) 40 MG tablet TAKE ONE TABLET BY MOUTH ONE TIME DAILY 30  tablet 0  . albuterol (PROVENTIL HFA;VENTOLIN HFA) 108 (90 BASE) MCG/ACT inhaler Inhale 2 puffs into the lungs every 6 (six) hours as needed for wheezing or shortness of breath. (Patient not taking: Reported on 09/05/2015) 1 Inhaler 0  . Ascorbic Acid (VITAMIN C) 500 MG tablet Reported on 09/05/2015    . Multiple Vitamin (MULTIVITAMIN) tablet Take 1 tablet by mouth daily. Reported on 09/05/2015    . traMADol-acetaminophen (ULTRACET) 37.5-325 MG per tablet TAKE ONE TABLET BY MOUTH EVERY FOUR HOURS AS NEEDED  (Patient not taking: Reported on 07/22/2015) 100 tablet 5   No current facility-administered medications on file prior to visit.    Allergies  Allergen Reactions  . Okra     Family History  Problem Relation Age of Onset  . Hypertension Mother   . Diabetes Mother   . Heart disease Mother   . Heart disease Father     Cardiovascular accident    BP 134/86  mmHg  Pulse 81  Temp(Src) 97.7 F (36.5 C) (Oral)  Ht 5\' 2"  (1.575 m)  Wt 245 lb (111.131 kg)  BMI 44.80 kg/m2  SpO2 93%  Review of Systems She denies hypoglycemia.  She has gained weight    Objective:   Physical Exam VITAL SIGNS:  See vs page GENERAL: no distress LUNGS:  Clear to auscultation, except for rales at the bases Pulses: dorsalis pedis intact bilat.   MSK: no deformity of the feet CV: 2+ bilat leg edema Skin:  no ulcer on the feet.  normal color and temp on the feet.  Old healed surgical scar (vein harvest) on the right leg.  Neuro: sensation is intact to touch on the feet, but decreased from normal.    Lab Results  Component Value Date   CREATININE 1.91* 09/05/2015   BUN 44* 09/05/2015   NA 140 09/05/2015   K 4.0 09/05/2015   CL 105 09/05/2015   CO2 28 09/05/2015   BNP=high Fructosamine=350.    Assessment & Plan:  DM: this is the best control this pt should aim for, given this regimen, which does match insulin to her changing needs throughout the day CHF: worse Renal insuff: slightly improved, possibly due to decreased diuretic rx.    Please see a kidney specialist.  Also, please go back to see Dr Antoine PocheHochrein.  It is important to never miss the furosemide.  In addition, for the next 3 days, take metolazone once a day (this helps the furosemide).  i have sent a prescription to your pharmacy.   Subjective:   Patient here for Medicare annual wellness visit and management of other chronic and acute problems.     Risk factors: advanced age    Roster of Physicians Providing Medical Care to Patient:  See "snapshot"   Activities of Daily Living: In your present state of health, do you have any difficulty performing the following activities?:  Preparing food and eating?: No  Bathing yourself: No  Getting dressed: No  Using the toilet:No  Moving around from place to place: No  In the past year have you fallen or had a near fall?:No    Home Safety: Has smoke  detector and wears seat belts. No firearms. No excess sun exposure.  Diet and Exercise  Current exercise habits: limited by health problems Dietary issues discussed: pt reports a healthy diet   Depression Screen  Q1: Over the past two weeks, have you felt down, depressed or hopeless? no  Q2: Over the past two weeks, have  you felt little interest or pleasure in doing things? no   The following portions of the patient's history were reviewed and updated as appropriate: allergies, current medications, past family history, past medical history, past social history, past surgical history and problem list.   Review of Systems  Denies hearing loss, and visual loss.  Objective:   Vision:  Sees optometrist, and declines [facility] Hearing: grossly normal Body mass index:  See vs page Msk: pt easily and quickly performs "get-up-and-go" from a sitting position Cognitive Impairment Assessment: cognition, memory and judgment appear normal.  remembers 3/3 at 5 minutes.  excellent recall.  can easily read and write a sentence.  alert and oriented x 3.    Assessment:   Medicare wellness utd on preventive parameters    Plan:   During the course of the visit the patient was educated and counseled about appropriate screening and preventive services including:       Fall prevention   Screening mammography  Bone densitometry screening  Diabetes screening  Nutrition counseling   Vaccines / LABS Zostavax / Pneumococcal Vaccine today   Patient Instructions (the written plan) was given to the patient.

## 2015-09-06 LAB — BASIC METABOLIC PANEL
BUN: 44 mg/dL — AB (ref 6–23)
CO2: 28 meq/L (ref 19–32)
Calcium: 10.3 mg/dL (ref 8.4–10.5)
Chloride: 105 mEq/L (ref 96–112)
Creatinine, Ser: 1.91 mg/dL — ABNORMAL HIGH (ref 0.40–1.20)
GFR: 27.94 mL/min — AB (ref >60.00)
GLUCOSE: 79 mg/dL (ref 70–99)
POTASSIUM: 4 meq/L (ref 3.5–5.1)
Sodium: 140 mEq/L (ref 135–145)

## 2015-09-06 LAB — BRAIN NATRIURETIC PEPTIDE: Pro B Natriuretic peptide (BNP): 2773 pg/mL — ABNORMAL HIGH (ref 0.0–100.0)

## 2015-09-06 LAB — FRUCTOSAMINE: Fructosamine: 350 umol/L — ABNORMAL HIGH (ref 0–285)

## 2015-09-08 ENCOUNTER — Telehealth: Payer: Self-pay | Admitting: Endocrinology

## 2015-09-08 NOTE — Telephone Encounter (Signed)
New Message ° °This message is to inform you that we have made 3 consecutive attempts to contact the patient. We have also mailed a letter to the patient to inform them to call in and schedule. Although we were unsuccessful in these attempts we wanted you to be aware of our efforts. Will remove the patient from our work queue at this time. ° ° °Shanti °[facility] ° °

## 2015-09-25 ENCOUNTER — Other Ambulatory Visit: Payer: Self-pay | Admitting: Endocrinology

## 2015-10-02 ENCOUNTER — Other Ambulatory Visit: Payer: Self-pay | Admitting: Endocrinology

## 2015-10-04 NOTE — Telephone Encounter (Signed)
I contacted the pt's daughter and left a vm advising her to call [facility] back to schedule her appointment.  [facility] provided to the pt's daughter.

## 2015-10-04 NOTE — Telephone Encounter (Signed)
Patient called asking you to call her daughter to set up an type of appointments in the near future.  Elon JesterMichele [name] ([place]) [phone]

## 2015-10-06 ENCOUNTER — Other Ambulatory Visit: Payer: Self-pay | Admitting: Endocrinology

## 2015-10-12 IMAGING — CR DG CHEST 2V
2 series · 2 of 2 positions shown · non-contrast
Comparison: 09/04/2013

CLINICAL DATA: Dyspnea.

EXAM:
CHEST - 2 VIEW

[w chest pa]
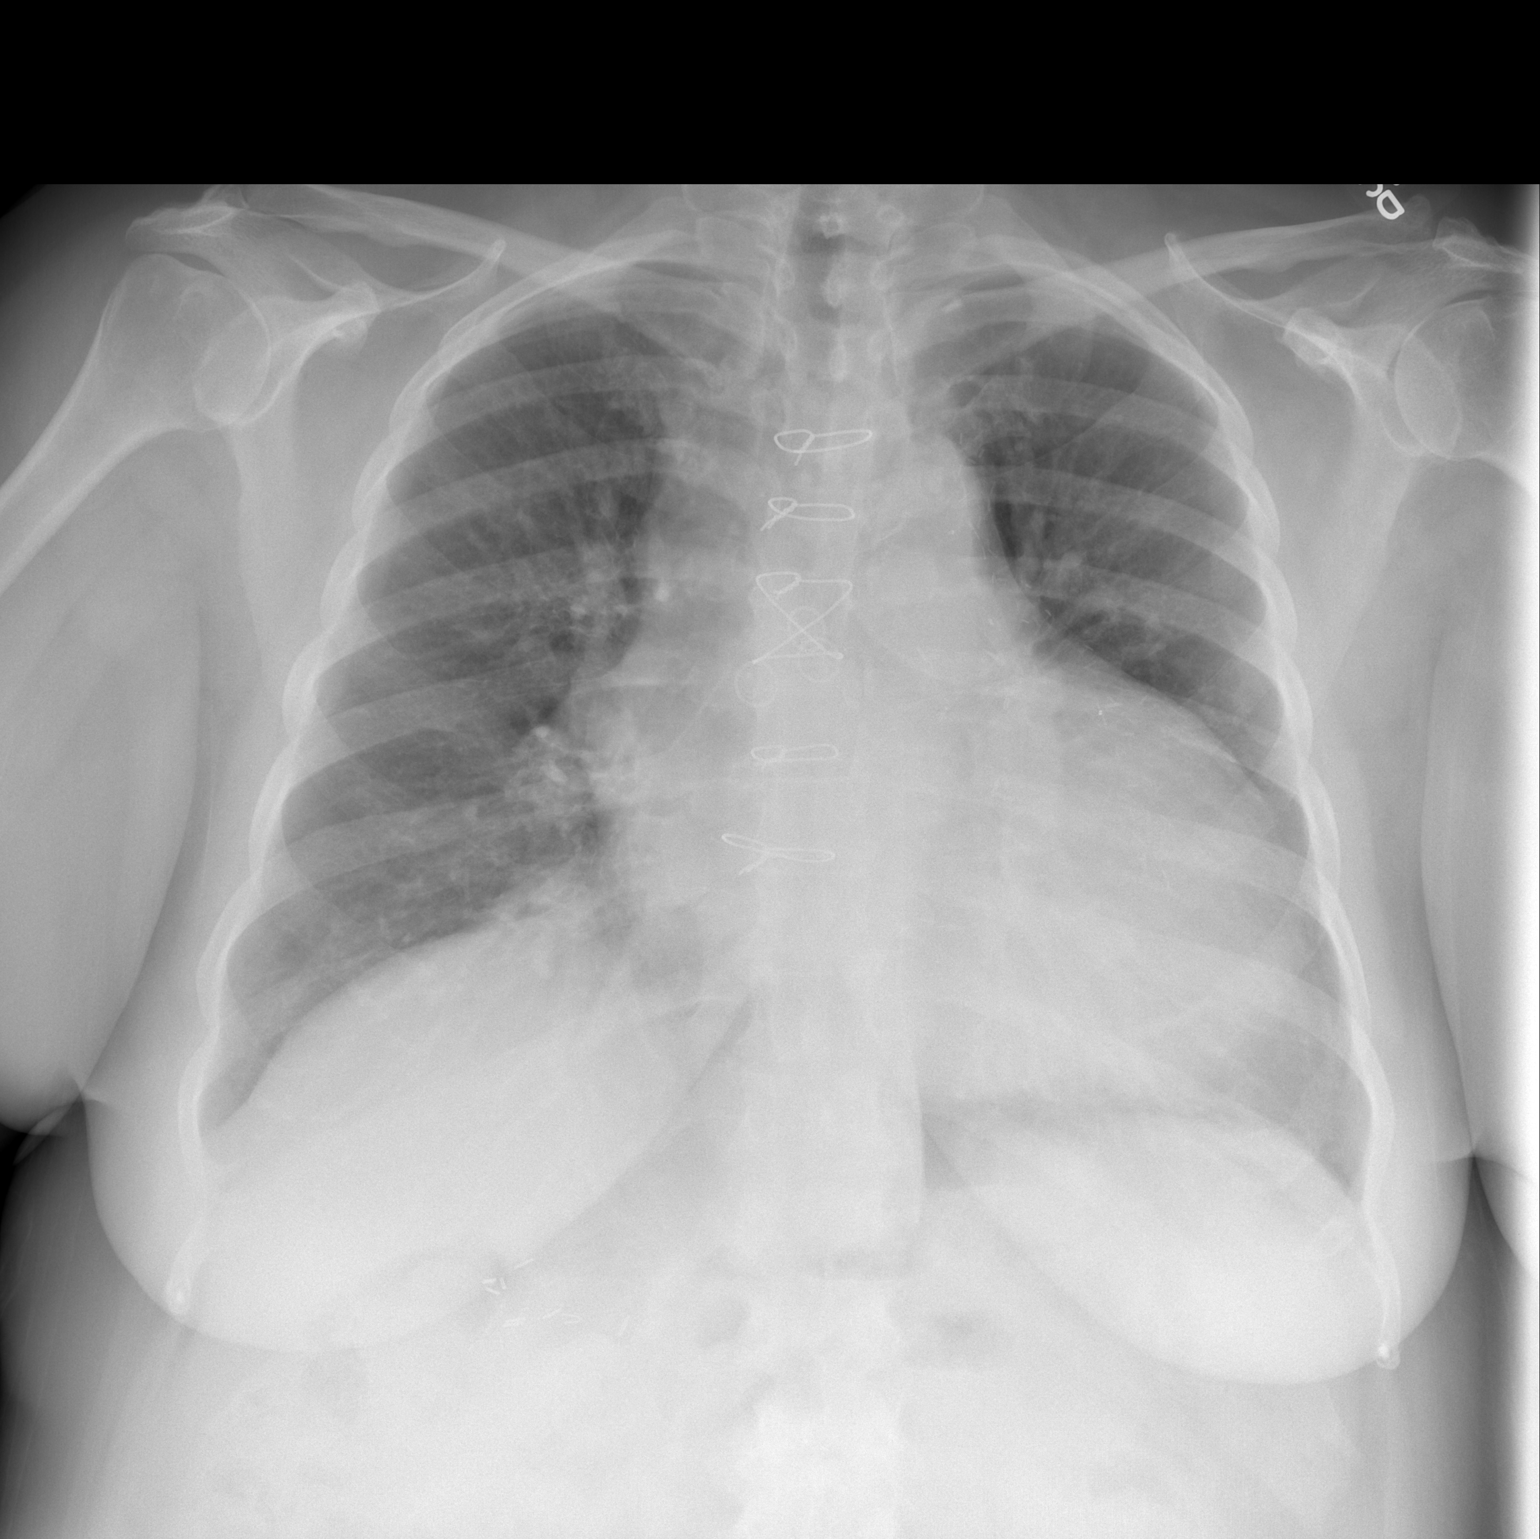

[w chest lat]
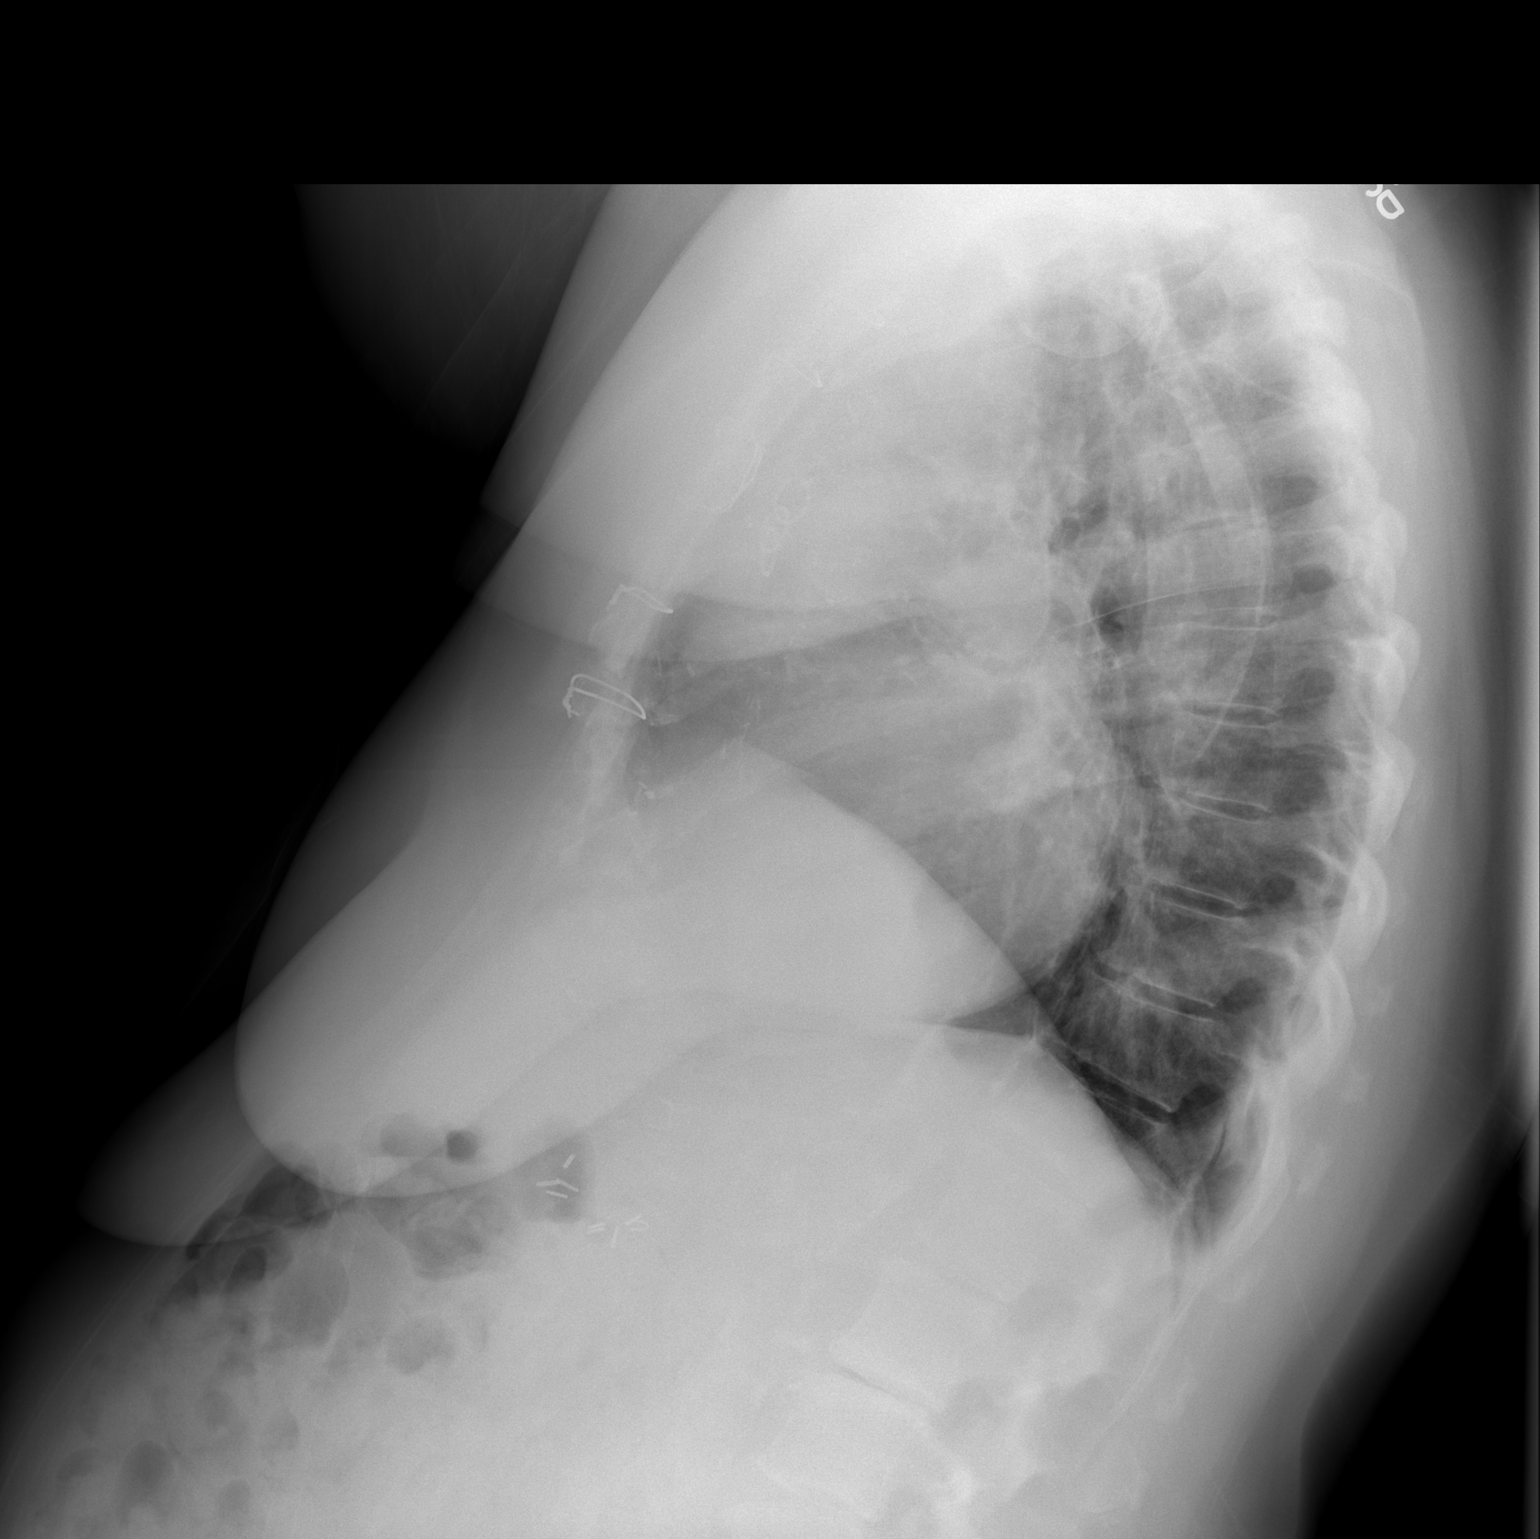

[2 of 2 positions shown; findings below may reference images not displayed]

FINDINGS: Stable cardiomegaly and evidence of prior CABG. There is no evidence
of pulmonary edema, consolidation, pneumothorax, nodule or pleural
fluid. The bony thorax is unremarkable.
IMPRESSION: Stable cardiomegaly.  No acute findings.

## 2015-10-21 ENCOUNTER — Ambulatory Visit: Payer: Medicare Other | Admitting: Endocrinology

## 2015-11-01 ENCOUNTER — Other Ambulatory Visit: Payer: Self-pay | Admitting: Endocrinology

## 2015-11-12 ENCOUNTER — Other Ambulatory Visit: Payer: Self-pay | Admitting: Endocrinology

## 2015-11-28 ENCOUNTER — Other Ambulatory Visit: Payer: Self-pay | Admitting: Endocrinology

## 2015-12-10 ENCOUNTER — Other Ambulatory Visit: Payer: Self-pay | Admitting: Endocrinology

## 2015-12-26 ENCOUNTER — Telehealth: Payer: Self-pay | Admitting: Endocrinology

## 2015-12-26 MED ORDER — OXYBUTYNIN CHLORIDE ER 10 MG PO TB24: 10.0000 mg | ORAL_TABLET | Freq: Every day | ORAL | 2 refills | Status: DC

## 2015-12-26 NOTE — Telephone Encounter (Signed)
Pt needs refills of oxybutane called to [facility]

## 2015-12-26 NOTE — Telephone Encounter (Signed)
Refill submitted. 

## 2016-01-07 ENCOUNTER — Other Ambulatory Visit: Payer: Self-pay | Admitting: Endocrinology

## 2016-02-11 ENCOUNTER — Other Ambulatory Visit: Payer: Self-pay | Admitting: Endocrinology

## 2016-02-11 NOTE — Telephone Encounter (Signed)
Please refill x 1 Ov is due  

## 2016-02-25 ENCOUNTER — Other Ambulatory Visit: Payer: Self-pay | Admitting: Endocrinology

## 2016-03-04 ENCOUNTER — Other Ambulatory Visit: Payer: Self-pay | Admitting: Endocrinology

## 2016-03-14 ENCOUNTER — Other Ambulatory Visit: Payer: Self-pay | Admitting: Endocrinology

## 2016-03-29 ENCOUNTER — Other Ambulatory Visit: Payer: Self-pay | Admitting: Endocrinology

## 2016-03-31 ENCOUNTER — Other Ambulatory Visit: Payer: Self-pay | Admitting: Endocrinology

## 2016-04-10 ENCOUNTER — Other Ambulatory Visit: Payer: Self-pay | Admitting: Endocrinology

## 2016-04-14 ENCOUNTER — Other Ambulatory Visit: Payer: Self-pay | Admitting: Endocrinology

## 2016-05-11 ENCOUNTER — Other Ambulatory Visit: Payer: Self-pay | Admitting: Endocrinology

## 2016-06-09 ENCOUNTER — Other Ambulatory Visit: Payer: Self-pay | Admitting: Endocrinology

## 2016-06-11 ENCOUNTER — Other Ambulatory Visit: Payer: Self-pay

## 2016-06-11 MED ORDER — INSULIN NPH ISOPHANE & REGULAR (70-30) 100 UNIT/ML ~~LOC~~ SUSP: SUBCUTANEOUS | 2 refills | Status: DC

## 2016-07-13 ENCOUNTER — Other Ambulatory Visit: Payer: Self-pay | Admitting: Endocrinology

## 2016-07-13 NOTE — Telephone Encounter (Signed)
Please refill x 1 Ov is due  

## 2016-08-17 ENCOUNTER — Other Ambulatory Visit: Payer: Self-pay | Admitting: Endocrinology

## 2016-08-20 ENCOUNTER — Other Ambulatory Visit: Payer: Self-pay | Admitting: Endocrinology

## 2016-08-30 ENCOUNTER — Ambulatory Visit (INDEPENDENT_AMBULATORY_CARE_PROVIDER_SITE_OTHER): Payer: Medicare Other | Admitting: Endocrinology

## 2016-08-30 ENCOUNTER — Encounter: Payer: Self-pay | Admitting: Endocrinology

## 2016-08-30 VITALS — BP 106/68 | HR 69 | Ht 62.0 in | Wt 235.0 lb

## 2016-08-30 DIAGNOSIS — N183 Chronic kidney disease, stage 3 unspecified: Secondary | ICD-10-CM

## 2016-08-30 DIAGNOSIS — D649 Anemia, unspecified: Secondary | ICD-10-CM | POA: Insufficient documentation

## 2016-08-30 DIAGNOSIS — Z794 Long term (current) use of insulin: Secondary | ICD-10-CM | POA: Diagnosis not present

## 2016-08-30 DIAGNOSIS — N289 Disorder of kidney and ureter, unspecified: Secondary | ICD-10-CM

## 2016-08-30 DIAGNOSIS — Z0000 Encounter for general adult medical examination without abnormal findings: Secondary | ICD-10-CM

## 2016-08-30 DIAGNOSIS — I5022 Chronic systolic (congestive) heart failure: Secondary | ICD-10-CM

## 2016-08-30 DIAGNOSIS — E876 Hypokalemia: Secondary | ICD-10-CM | POA: Diagnosis not present

## 2016-08-30 DIAGNOSIS — N184 Chronic kidney disease, stage 4 (severe): Secondary | ICD-10-CM

## 2016-08-30 DIAGNOSIS — E1122 Type 2 diabetes mellitus with diabetic chronic kidney disease: Secondary | ICD-10-CM | POA: Diagnosis not present

## 2016-08-30 DIAGNOSIS — D631 Anemia in chronic kidney disease: Secondary | ICD-10-CM

## 2016-08-30 LAB — LIPID PANEL
Cholesterol: 99 mg/dL (ref 0–200)
HDL: 31.2 mg/dL — AB (ref >39.00)
LDL Cholesterol: 53 mg/dL (ref 0–99)
NONHDL: 67.43
TRIGLYCERIDES: 71 mg/dL (ref 0.0–149.0)
Total CHOL/HDL Ratio: 3
VLDL: 14.2 mg/dL (ref 0.0–40.0)

## 2016-08-30 LAB — CBC WITH DIFFERENTIAL/PLATELET
BASOS ABS: 0.1 10*3/uL (ref 0.0–0.1)
Basophils Relative: 1.1 % (ref 0.0–3.0)
Eosinophils Absolute: 0.2 10*3/uL (ref 0.0–0.7)
Eosinophils Relative: 4.3 % (ref 0.0–5.0)
HEMATOCRIT: 36.5 % (ref 36.0–46.0)
Hemoglobin: 11.9 g/dL — ABNORMAL LOW (ref 12.0–15.0)
LYMPHS ABS: 1.1 10*3/uL (ref 0.7–4.0)
LYMPHS PCT: 19.2 % (ref 12.0–46.0)
MCHC: 32.6 g/dL (ref 30.0–36.0)
MCV: 96 fl (ref 78.0–100.0)
MONOS PCT: 9.9 % (ref 3.0–12.0)
Monocytes Absolute: 0.5 10*3/uL (ref 0.1–1.0)
NEUTROS PCT: 65.5 % (ref 43.0–77.0)
Neutro Abs: 3.6 10*3/uL (ref 1.4–7.7)
Platelets: 158 10*3/uL (ref 150.0–400.0)
RBC: 3.81 Mil/uL — AB (ref 3.87–5.11)
RDW: 16.6 % — ABNORMAL HIGH (ref 11.5–15.5)
WBC: 5.5 10*3/uL (ref 4.0–10.5)

## 2016-08-30 LAB — BASIC METABOLIC PANEL
BUN: 70 mg/dL — AB (ref 6–23)
CHLORIDE: 103 meq/L (ref 96–112)
CO2: 27 mEq/L (ref 19–32)
Calcium: 9.7 mg/dL (ref 8.4–10.5)
Creatinine, Ser: 2.97 mg/dL — ABNORMAL HIGH (ref 0.40–1.20)
GFR: 16.74 mL/min — AB (ref >60.00)
Glucose, Bld: 121 mg/dL — ABNORMAL HIGH (ref 70–99)
POTASSIUM: 4 meq/L (ref 3.5–5.1)
Sodium: 137 mEq/L (ref 135–145)

## 2016-08-30 LAB — TSH: TSH: 3.4 u[IU]/mL (ref 0.35–4.50)

## 2016-08-30 LAB — HEPATIC FUNCTION PANEL
ALBUMIN: 4 g/dL (ref 3.5–5.2)
ALT: 15 U/L (ref 0–35)
AST: 19 U/L (ref 0–37)
Alkaline Phosphatase: 52 U/L (ref 39–117)
Bilirubin, Direct: 0.3 mg/dL (ref 0.0–0.3)
TOTAL PROTEIN: 7.3 g/dL (ref 6.0–8.3)
Total Bilirubin: 0.8 mg/dL (ref 0.2–1.2)

## 2016-08-30 LAB — BRAIN NATRIURETIC PEPTIDE: Pro B Natriuretic peptide (BNP): 1539 pg/mL — ABNORMAL HIGH (ref 0.0–100.0)

## 2016-08-30 LAB — POCT GLYCOSYLATED HEMOGLOBIN (HGB A1C): HEMOGLOBIN A1C: 6.7

## 2016-08-30 MED ORDER — FUROSEMIDE 80 MG PO TABS: 80.0000 mg | ORAL_TABLET | Freq: Two times a day (BID) | ORAL | 3 refills | Status: DC

## 2016-08-30 MED ORDER — INSULIN NPH ISOPHANE & REGULAR (70-30) 100 UNIT/ML ~~LOC~~ SUSP: 12.0000 [IU] | Freq: Every day | SUBCUTANEOUS | 2 refills | Status: AC

## 2016-08-30 NOTE — Patient Instructions (Addendum)
Please come back for a follow-up appointment in 2 months. Please reduce the insulin to 12 units with breakfast. Please go back to see the heart a specialist.  you will receive a phone call, about a day and time for an appointment.

## 2016-08-30 NOTE — Progress Notes (Signed)
Subjective:    Patient ID: [name], female    DOB: [birth year], 66 y.o.   MRN: [id number]  HPI Pt returns for f/u of diabetes mellitus: DM type: Insulin-requiring type 2 Dx'ed: 2001 Complications: polyneuropathy, renal insufficiency, CAD, PAD, and retinopathy.   Therapy: insulin since soon after dx.   GDM: never DKA: never Severe hypoglycemia: never.  Pancreatitis: never.   Other: she is on a QD inexpensive insulin regimen, at her request.   Interval history: she brings a record of her cbg's which i have reviewed today.  It varies from 109-200's.  There is no trend throughout the day.  She takes 15 units qam, and none in the evening.  She denies hypoglycemia.   Past Medical History:  Diagnosis Date  . CAD (coronary artery disease)   . CHF (congestive heart failure) (HCC)    EF 25%  . Cholelithiasis   . CVA (cerebral vascular accident) (HCC)   . Depression   . Diabetes mellitus   . Diabetic nephropathy (HCC)   . Diabetic retinopathy   . Hyperlipemia   . OSA (obstructive sleep apnea) 01/17/2011    Past Surgical History:  Procedure Laterality Date  . ADENOIDECTOMY    . c-sections     x2  . CHOLECYSTECTOMY    . CORONARY ARTERY BYPASS GRAFT     X 5  . TONSILLECTOMY    . TRACHEOSTOMY    . WISDOM TOOTH EXTRACTION     x3    Social History   Social History  . Marital status: Divorced    Spouse name: N/A  . Number of children: 1  . Years of education: N/A   Occupational History  . Teacher Unemployed    retired   Social History Main Topics  . Smoking status: Former Smoker    Years: 1.00    Types: Cigarettes    Quit date: 09/13/1968  . Smokeless tobacco: Never Used     Comment: occasional  . Alcohol use No  . Drug use: No  . Sexual activity: Not on file   Other Topics Concern  . Not on file   Social History Narrative  . No narrative on file    Current Outpatient Prescriptions on File Prior to Visit  Medication Sig Dispense Refill  .  Ascorbic Acid (VITAMIN C) 500 MG tablet Reported on 09/05/2015    . aspirin 81 MG tablet Take 81 mg by mouth daily.      . Calcium-Phosphorus-Vitamin D (CALCIUM GUMMIES PO) Take 1 tablet by mouth daily. Reported on 07/22/2015    . carvedilol (COREG) 3.125 MG tablet TAKE ONE TABLET BY MOUTH TWICE DAILY 180 tablet 0  . enalapril (VASOTEC) 10 MG tablet TAKE ONE TABLET BY MOUTH TWICE DAILY 60 tablet 0  . metolazone (ZAROXOLYN) 2.5 MG tablet TAKE 1 TABLET EVERY DAY 30 tablet 0  . Multiple Vitamin (MULTIVITAMIN) tablet Take 1 tablet by mouth daily. Reported on 09/05/2015    . nitroGLYCERIN (NITROSTAT) 0.4 MG SL tablet Place 1 tablet (0.4 mg total) under the tongue every 5 (five) minutes as needed for chest pain. 25 tablet 11  . oxybutynin (DITROPAN-XL) 10 MG 24 hr tablet TAKE 1 TABLET (10 MG TOTAL) BY MOUTH DAILY. 30 tablet 2  . simvastatin (ZOCOR) 40 MG tablet TAKE ONE TABLET BY MOUTH ONE TIME DAILY 30 tablet 0  . vitamin E 100 UNIT capsule Take by mouth daily.    Marland Kitchen oxybutynin (DITROPAN-XL) 10 MG 24 hr tablet TAKE 1 TABLET (  10 MG TOTAL) BY MOUTH DAILY. (Patient not taking: Reported on 08/30/2016) 30 tablet 2  . simvastatin (ZOCOR) 40 MG tablet TAKE ONE TABLET BY MOUTH ONCE DAILY. (Patient not taking: Reported on 08/30/2016) 30 tablet 0   No current facility-administered medications on file prior to visit.     Allergies  Allergen Reactions  . Okra     Family History  Problem Relation Age of Onset  . Hypertension Mother   . Diabetes Mother   . Heart disease Mother   . Heart disease Father        Cardiovascular accident    BP 106/68   Pulse 69   Ht 5\' 2"  (1.575 m)   Wt 235 lb (106.6 kg)   SpO2 96%   BMI 42.98 kg/m    Review of Systems She denies hypoglycemia. She has chronic bilat foot pain (she feels related to edema).      Objective:   Physical Exam VITAL SIGNS:  See vs page GENERAL: no distress Pulses: dorsalis pedis intact bilat.   MSK: no deformity of the feet CV: 2+ bilat leg  edema Skin:  no ulcer on the feet.  normal color and temp on the feet.  Old healed surgical scar (vein harvest) on the right leg.  Neuro: sensation is intact to touch on the feet, but decreased from normal.    A1c=6.7%  I personally reviewed electrocardiogram tracing (today): Indication: DM Impression: NSR.  No MI.  No hypertrophy. LBBB Compared to 11/24/13: no change  Lab Results  Component Value Date   CREATININE 2.97 (H) 08/30/2016   BUN 70 (H) 08/30/2016   NA 137 08/30/2016   K 4.0 08/30/2016   CL 103 08/30/2016   CO2 27 08/30/2016      Assessment & Plan:  Insulin-requiring type 2 DM: overcontrolled, given this regimen, which does match insulin to her changing needs throughout the day. CHF: persistent Chronic foot pain: we recheck this when and if edema is improved.  Renal failure: we can't increase diuretic rx now.  I'll defer to cardiol.   Patient Instructions  Please come back for a follow-up appointment in 2 months. Please reduce the insulin to 12 units with breakfast. Please go back to see the heart a specialist.  you will receive a phone call, about a day and time for an appointment.

## 2016-09-05 ENCOUNTER — Telehealth: Payer: Self-pay

## 2016-09-05 NOTE — Telephone Encounter (Signed)
Called patient to let her know that the paperwork to be excused from jury duty was ready and she requested I mail it to her- sent on 09/05/2016

## 2016-09-11 ENCOUNTER — Telehealth: Payer: Self-pay | Admitting: Endocrinology

## 2016-09-11 ENCOUNTER — Other Ambulatory Visit: Payer: Self-pay | Admitting: Endocrinology

## 2016-09-11 NOTE — Telephone Encounter (Signed)
Patient's daughter Elon JesterMichele called to inquire about kidney specialist. I advised her that the referral was for [facility] and transferred her to their direct line for scheduling.

## 2016-09-13 DIAGNOSIS — E1129 Type 2 diabetes mellitus with other diabetic kidney complication: Secondary | ICD-10-CM | POA: Diagnosis not present

## 2016-09-13 DIAGNOSIS — I5022 Chronic systolic (congestive) heart failure: Secondary | ICD-10-CM | POA: Diagnosis not present

## 2016-09-13 DIAGNOSIS — N2581 Secondary hyperparathyroidism of renal origin: Secondary | ICD-10-CM | POA: Diagnosis not present

## 2016-09-13 DIAGNOSIS — D649 Anemia, unspecified: Secondary | ICD-10-CM | POA: Diagnosis not present

## 2016-09-13 DIAGNOSIS — N184 Chronic kidney disease, stage 4 (severe): Secondary | ICD-10-CM | POA: Diagnosis not present

## 2016-09-13 DIAGNOSIS — Z6841 Body Mass Index (BMI) 40.0 and over, adult: Secondary | ICD-10-CM | POA: Diagnosis not present

## 2016-09-16 ENCOUNTER — Other Ambulatory Visit: Payer: Self-pay | Admitting: Endocrinology

## 2016-09-21 ENCOUNTER — Other Ambulatory Visit: Payer: Self-pay | Admitting: Endocrinology

## 2016-09-28 DIAGNOSIS — I5022 Chronic systolic (congestive) heart failure: Secondary | ICD-10-CM | POA: Diagnosis not present

## 2016-10-18 ENCOUNTER — Other Ambulatory Visit: Payer: Self-pay | Admitting: Endocrinology

## 2016-10-19 ENCOUNTER — Other Ambulatory Visit: Payer: Self-pay | Admitting: Endocrinology

## 2016-10-27 ENCOUNTER — Other Ambulatory Visit: Payer: Self-pay | Admitting: Endocrinology

## 2016-10-30 ENCOUNTER — Telehealth: Payer: Self-pay

## 2016-10-30 NOTE — Telephone Encounter (Signed)
Patient called in stating she believed to be having an allergic reaction to a medication she had just started. Patient stated the medication was Torsemide (which is not on med list), I asked patient what symptoms she was having. Patient stated that she had swelling in her feet, and a bright red bumpy rash that is going up her forearm and it itches, patient also sounds SOB on the phone. Patient advised that she stopped taking her blood pressure medication a week ago, after talking with the patient I advised her to go to the ED, patient stated that her son was unable to take her there and she could not go, I then advised patient to call 911 as it sounded like she could be having a reaction, patient refused to go and states that "the rash looks bad, but it isnt", and also stated that "this was not that serious, she just wanted Dr.Ellison to give her some medication", I kept advising patient to call 911, and explained it could get worse, but patient refused, then patient asked if her refills had been called in. Patient then stated she would "watch to see if the rash gets worse", and hung up.

## 2016-10-30 NOTE — Telephone Encounter (Signed)
please call patient: Needs f/u ov next available appt

## 2016-10-30 NOTE — Telephone Encounter (Signed)
Patient called and declined an appt for Thursday. Will call back if anything changes.

## 2016-10-31 ENCOUNTER — Emergency Department (HOSPITAL_COMMUNITY): Payer: Medicare Other

## 2016-10-31 ENCOUNTER — Inpatient Hospital Stay (HOSPITAL_COMMUNITY)
Admission: EM | Admit: 2016-10-31 | Discharge: 2016-11-04 | DRG: 291 | Disposition: A | Discharge status: Home / Self Care | Payer: Medicare Other | Attending: Internal Medicine | Admitting: Internal Medicine

## 2016-10-31 ENCOUNTER — Ambulatory Visit: Payer: Medicare Other | Admitting: Endocrinology

## 2016-10-31 ENCOUNTER — Encounter (HOSPITAL_COMMUNITY): Payer: Self-pay | Admitting: Emergency Medicine

## 2016-10-31 DIAGNOSIS — Z6841 Body Mass Index (BMI) 40.0 and over, adult: Secondary | ICD-10-CM | POA: Diagnosis not present

## 2016-10-31 DIAGNOSIS — Z794 Long term (current) use of insulin: Secondary | ICD-10-CM

## 2016-10-31 DIAGNOSIS — E1122 Type 2 diabetes mellitus with diabetic chronic kidney disease: Secondary | ICD-10-CM | POA: Diagnosis present

## 2016-10-31 DIAGNOSIS — I10 Essential (primary) hypertension: Secondary | ICD-10-CM | POA: Diagnosis present

## 2016-10-31 DIAGNOSIS — E1121 Type 2 diabetes mellitus with diabetic nephropathy: Secondary | ICD-10-CM | POA: Diagnosis present

## 2016-10-31 DIAGNOSIS — Z8249 Family history of ischemic heart disease and other diseases of the circulatory system: Secondary | ICD-10-CM

## 2016-10-31 DIAGNOSIS — G4733 Obstructive sleep apnea (adult) (pediatric): Secondary | ICD-10-CM | POA: Diagnosis present

## 2016-10-31 DIAGNOSIS — R778 Other specified abnormalities of plasma proteins: Secondary | ICD-10-CM | POA: Diagnosis present

## 2016-10-31 DIAGNOSIS — Z8673 Personal history of transient ischemic attack (TIA), and cerebral infarction without residual deficits: Secondary | ICD-10-CM

## 2016-10-31 DIAGNOSIS — Z79899 Other long term (current) drug therapy: Secondary | ICD-10-CM

## 2016-10-31 DIAGNOSIS — N189 Chronic kidney disease, unspecified: Secondary | ICD-10-CM

## 2016-10-31 DIAGNOSIS — N184 Chronic kidney disease, stage 4 (severe): Secondary | ICD-10-CM | POA: Diagnosis not present

## 2016-10-31 DIAGNOSIS — E785 Hyperlipidemia, unspecified: Secondary | ICD-10-CM | POA: Diagnosis present

## 2016-10-31 DIAGNOSIS — F329 Major depressive disorder, single episode, unspecified: Secondary | ICD-10-CM | POA: Diagnosis present

## 2016-10-31 DIAGNOSIS — R296 Repeated falls: Secondary | ICD-10-CM | POA: Diagnosis present

## 2016-10-31 DIAGNOSIS — I2510 Atherosclerotic heart disease of native coronary artery without angina pectoris: Secondary | ICD-10-CM | POA: Diagnosis present

## 2016-10-31 DIAGNOSIS — I5023 Acute on chronic systolic (congestive) heart failure: Secondary | ICD-10-CM | POA: Diagnosis present

## 2016-10-31 DIAGNOSIS — D649 Anemia, unspecified: Secondary | ICD-10-CM | POA: Diagnosis present

## 2016-10-31 DIAGNOSIS — E119 Type 2 diabetes mellitus without complications: Secondary | ICD-10-CM

## 2016-10-31 DIAGNOSIS — I130 Hypertensive heart and chronic kidney disease with heart failure and stage 1 through stage 4 chronic kidney disease, or unspecified chronic kidney disease: Secondary | ICD-10-CM | POA: Diagnosis not present

## 2016-10-31 DIAGNOSIS — Z7982 Long term (current) use of aspirin: Secondary | ICD-10-CM

## 2016-10-31 DIAGNOSIS — N179 Acute kidney failure, unspecified: Secondary | ICD-10-CM | POA: Diagnosis present

## 2016-10-31 DIAGNOSIS — Z833 Family history of diabetes mellitus: Secondary | ICD-10-CM

## 2016-10-31 DIAGNOSIS — R7989 Other specified abnormal findings of blood chemistry: Secondary | ICD-10-CM | POA: Diagnosis present

## 2016-10-31 DIAGNOSIS — Z951 Presence of aortocoronary bypass graft: Secondary | ICD-10-CM

## 2016-10-31 DIAGNOSIS — B372 Candidiasis of skin and nail: Secondary | ICD-10-CM | POA: Diagnosis present

## 2016-10-31 DIAGNOSIS — R110 Nausea: Secondary | ICD-10-CM | POA: Diagnosis not present

## 2016-10-31 DIAGNOSIS — R601 Generalized edema: Secondary | ICD-10-CM | POA: Diagnosis not present

## 2016-10-31 DIAGNOSIS — E11319 Type 2 diabetes mellitus with unspecified diabetic retinopathy without macular edema: Secondary | ICD-10-CM | POA: Diagnosis present

## 2016-10-31 DIAGNOSIS — N289 Disorder of kidney and ureter, unspecified: Secondary | ICD-10-CM

## 2016-10-31 DIAGNOSIS — D631 Anemia in chronic kidney disease: Secondary | ICD-10-CM | POA: Diagnosis present

## 2016-10-31 DIAGNOSIS — I5021 Acute systolic (congestive) heart failure: Secondary | ICD-10-CM | POA: Diagnosis not present

## 2016-10-31 DIAGNOSIS — I447 Left bundle-branch block, unspecified: Secondary | ICD-10-CM | POA: Diagnosis not present

## 2016-10-31 DIAGNOSIS — Z87891 Personal history of nicotine dependence: Secondary | ICD-10-CM

## 2016-10-31 DIAGNOSIS — M7989 Other specified soft tissue disorders: Secondary | ICD-10-CM | POA: Diagnosis not present

## 2016-10-31 DIAGNOSIS — Z91018 Allergy to other foods: Secondary | ICD-10-CM

## 2016-10-31 LAB — URINALYSIS, ROUTINE W REFLEX MICROSCOPIC
Bilirubin Urine: NEGATIVE
Glucose, UA: NEGATIVE mg/dL
HGB URINE DIPSTICK: NEGATIVE
Ketones, ur: NEGATIVE mg/dL
Leukocytes, UA: NEGATIVE
Nitrite: NEGATIVE
PROTEIN: NEGATIVE mg/dL
Specific Gravity, Urine: 1.01 (ref 1.005–1.030)
pH: 5 (ref 5.0–8.0)

## 2016-10-31 LAB — CBC
HCT: 36.8 % (ref 36.0–46.0)
Hemoglobin: 11.9 g/dL — ABNORMAL LOW (ref 12.0–15.0)
MCH: 31 pg (ref 26.0–34.0)
MCHC: 32.3 g/dL (ref 30.0–36.0)
MCV: 95.8 fL (ref 78.0–100.0)
PLATELETS: 159 10*3/uL (ref 150–400)
RBC: 3.84 MIL/uL — AB (ref 3.87–5.11)
RDW: 16.4 % — ABNORMAL HIGH (ref 11.5–15.5)
WBC: 5.6 10*3/uL (ref 4.0–10.5)

## 2016-10-31 LAB — COMPREHENSIVE METABOLIC PANEL
ALK PHOS: 42 U/L (ref 38–126)
ALT: 18 U/L (ref 14–54)
AST: 40 U/L (ref 15–41)
Albumin: 4 g/dL (ref 3.5–5.0)
Anion gap: 11 (ref 5–15)
BILIRUBIN TOTAL: 1.1 mg/dL (ref 0.3–1.2)
BUN: 96 mg/dL — ABNORMAL HIGH (ref 6–20)
CALCIUM: 9.2 mg/dL (ref 8.9–10.3)
CO2: 21 mmol/L — AB (ref 22–32)
CREATININE: 3.32 mg/dL — AB (ref 0.44–1.00)
Chloride: 107 mmol/L (ref 101–111)
GFR, EST AFRICAN AMERICAN: 15 mL/min — AB (ref >60)
GFR, EST NON AFRICAN AMERICAN: 13 mL/min — AB (ref >60)
Glucose, Bld: 79 mg/dL (ref 65–99)
Potassium: 4.7 mmol/L (ref 3.5–5.1)
Sodium: 139 mmol/L (ref 135–145)
TOTAL PROTEIN: 7.8 g/dL (ref 6.5–8.1)

## 2016-10-31 LAB — GLUCOSE, CAPILLARY
Glucose-Capillary: 70 mg/dL (ref 65–99)
Glucose-Capillary: 99 mg/dL (ref 65–99)

## 2016-10-31 LAB — TROPONIN I
TROPONIN I: 0.05 ng/mL — AB (ref <0.03)
Troponin I: 0.05 ng/mL (ref <0.03)

## 2016-10-31 LAB — LIPASE, BLOOD: Lipase: 36 U/L (ref 11–51)

## 2016-10-31 LAB — BRAIN NATRIURETIC PEPTIDE: B NATRIURETIC PEPTIDE 5: 1780.5 pg/mL — AB (ref 0.0–100.0)

## 2016-10-31 MED ORDER — INSULIN ASPART 100 UNIT/ML ~~LOC~~ SOLN
0.0000 [IU] | Freq: Three times a day (TID) | SUBCUTANEOUS | Status: DC
  Administered 2016-11-01: 1 [IU] via SUBCUTANEOUS
  Administered 2016-11-02: 2 [IU] via SUBCUTANEOUS
  Administered 2016-11-02: 1 [IU] via SUBCUTANEOUS
  Administered 2016-11-03: 3 [IU] via SUBCUTANEOUS
  Administered 2016-11-03: 2 [IU] via SUBCUTANEOUS

## 2016-10-31 MED ORDER — IPRATROPIUM BROMIDE 0.02 % IN SOLN: 0.5000 mg | Freq: Four times a day (QID) | RESPIRATORY_TRACT | Status: DC | PRN

## 2016-10-31 MED ORDER — DEXTROSE 5 % IV SOLN
160.0000 mg | Freq: Once | INTRAVENOUS | Status: AC
  Administered 2016-10-31: 160 mg via INTRAVENOUS
  Filled 2016-10-31: qty 16

## 2016-10-31 MED ORDER — NITROGLYCERIN 0.4 MG SL SUBL: 0.4000 mg | SUBLINGUAL_TABLET | SUBLINGUAL | Status: DC | PRN

## 2016-10-31 MED ORDER — ASPIRIN EC 81 MG PO TBEC
81.0000 mg | DELAYED_RELEASE_TABLET | Freq: Every day | ORAL | Status: DC
  Administered 2016-11-01 – 2016-11-04 (×3): 81 mg via ORAL
  Filled 2016-10-31 (×4): qty 1

## 2016-10-31 MED ORDER — ALBUTEROL SULFATE (2.5 MG/3ML) 0.083% IN NEBU: 2.5000 mg | INHALATION_SOLUTION | Freq: Four times a day (QID) | RESPIRATORY_TRACT | Status: DC | PRN

## 2016-10-31 MED ORDER — CHLORHEXIDINE GLUCONATE 0.12 % MT SOLN
15.0000 mL | Freq: Two times a day (BID) | OROMUCOSAL | Status: DC
  Administered 2016-10-31 – 2016-11-01 (×2): 15 mL via OROMUCOSAL
  Filled 2016-10-31 (×5): qty 15

## 2016-10-31 MED ORDER — INSULIN ASPART PROT & ASPART (70-30 MIX) 100 UNIT/ML ~~LOC~~ SUSP
12.0000 [IU] | Freq: Every day | SUBCUTANEOUS | Status: DC
  Administered 2016-11-01 – 2016-11-04 (×4): 12 [IU] via SUBCUTANEOUS
  Filled 2016-10-31: qty 10

## 2016-10-31 MED ORDER — SIMVASTATIN 40 MG PO TABS
40.0000 mg | ORAL_TABLET | Freq: Every day | ORAL | Status: DC
  Administered 2016-11-01 – 2016-11-03 (×3): 40 mg via ORAL
  Filled 2016-10-31 (×4): qty 1

## 2016-10-31 MED ORDER — OXYBUTYNIN CHLORIDE ER 5 MG PO TB24
10.0000 mg | ORAL_TABLET | Freq: Every day | ORAL | Status: DC
  Administered 2016-10-31 – 2016-11-03 (×4): 10 mg via ORAL
  Filled 2016-10-31 (×4): qty 2

## 2016-10-31 MED ORDER — ORAL CARE MOUTH RINSE
15.0000 mL | Freq: Two times a day (BID) | OROMUCOSAL | Status: DC
  Administered 2016-11-01 (×2): 15 mL via OROMUCOSAL

## 2016-10-31 MED ORDER — HEPARIN SODIUM (PORCINE) 5000 UNIT/ML IJ SOLN
5000.0000 [IU] | Freq: Three times a day (TID) | INTRAMUSCULAR | Status: DC
  Administered 2016-10-31 – 2016-11-04 (×11): 5000 [IU] via SUBCUTANEOUS
  Filled 2016-10-31 (×11): qty 1

## 2016-10-31 NOTE — ED Triage Notes (Signed)
Pt complaint of bilateral leg swelling, nausea, and SOB worsening over the past few weeks.

## 2016-10-31 NOTE — H&P (Signed)
History and Physical    [name] ZOX:[id number] DOB: 1951-04-01 DOA: 10/31/2016  PCP: Romero Belling, MD   Patient coming from: Home.  I have personally briefly reviewed patient's old medical records in [facility]  Chief Complaint: Shortness of breath and leg swelling.  HPI: [name] is a 66 y.o. female with medical history significant of CAD/CABG, chronic systolic CHF with an EF of about 25-30% (per patient, this was told to her during cardiologist in office echo) CVA, depression, type 2 diabetes, diabetic nephropathy, diabetic retinopathy, hyperlipidemia, OSA not on CPAP who is coming to the emergency department with complaints of progressively worse shortness of breath and lower extremity edema for several weeks that has not responded to oral furosemide and most recently 2 torsemide which she started about 9 days ago. Associated symptoms are orthopnea, PND, palpitations, dizziness and fatigue. She also complains of diarrhea for the past 2-3 days and decreased urine volume for several days. She denies chest pain, diaphoresis, abdominal pain, emesis, melena, hematochezia, dysuria, frequency or hematuria. She has been having pruritus on her forearms.  ED Course: Workup in the emergency department shows normal urinalysis. WBC 5.6, hemoglobin 11.9 g/L and platelets of 159. Her EKG is sinus rhythm, troponin level 0.05 ng/mL, BMP 1780.5 pg/mL. Her CMP shows a BUN of 96 and a creatinine of 3.32 mg/dL (increased from 8.11 in May), the rest of the CMP is unremarkable. Lipase level was 36. Her chest radiograph shows cardiomegaly with pulmonary vascular congestion.  Review of Systems: As per HPI otherwise 10 point review of systems negative.    Past Medical History:  Diagnosis Date  . CAD (coronary artery disease)   . CHF (congestive heart failure) (HCC)    EF 25%  . Cholelithiasis   . CVA (cerebral vascular accident) (HCC)   . Depression   . Diabetes mellitus     . Diabetic nephropathy (HCC)   . Diabetic retinopathy   . Hyperlipemia   . OSA (obstructive sleep apnea) 01/17/2011    Past Surgical History:  Procedure Laterality Date  . ADENOIDECTOMY    . c-sections     x2  . CHOLECYSTECTOMY    . CORONARY ARTERY BYPASS GRAFT     X 5  . TONSILLECTOMY    . TRACHEOSTOMY    . WISDOM TOOTH EXTRACTION     x3     reports that she quit smoking about 48 years ago. Her smoking use included Cigarettes. She quit after 1.00 year of use. She has never used smokeless tobacco. She reports that she does not drink alcohol or use drugs.  Allergies  Allergen Reactions  . Okra Hives    Family History  Problem Relation Age of Onset  . Hypertension Mother   . Diabetes Mother   . Heart disease Mother   . Heart disease Father        Cardiovascular accident    Prior to Admission medications   Medication Sig Start Date End Date Taking? Authorizing Provider  Ascorbic Acid (VITAMIN C) 500 MG tablet Take 500 mg by mouth daily. Reported on 09/05/2015   Yes [provider]  aspirin 81 MG tablet Take 81 mg by mouth daily.     Yes [provider]  Calcium-Phosphorus-Vitamin D (CALCIUM GUMMIES PO) Take 1 tablet by mouth daily. Reported on 07/22/2015   Yes [provider]  carvedilol (COREG) 3.125 MG tablet TAKE ONE TABLET BY MOUTH TWICE DAILY 05/25/15  Yes Romero Belling, MD  enalapril (VASOTEC)  10 MG tablet TAKE 1 TABLET BY MOUTH TWICE A DAY 10/19/16  Yes Romero BellingEllison, Sean, MD  insulin NPH-regular Human (HUMULIN 70/30) (70-30) 100 UNIT/ML injection Inject 12 Units into the skin daily with breakfast. 08/30/16  Yes Romero BellingEllison, Sean, MD  Multiple Vitamin (MULTIVITAMIN) tablet Take 1 tablet by mouth daily. Reported on 09/05/2015   Yes [provider]  nitroGLYCERIN (NITROSTAT) 0.4 MG SL tablet Place 1 tablet (0.4 mg total) under the tongue every 5 (five) minutes as needed for chest pain. 04/30/12  Yes Rollene RotundaHochrein, James, MD  oxybutynin (DITROPAN-XL) 10 MG  24 hr tablet TAKE 1 TABLET (10 MG TOTAL) BY MOUTH DAILY. 10/18/16  Yes Romero BellingEllison, Sean, MD  simvastatin (ZOCOR) 40 MG tablet TAKE ONE TABLET BY MOUTH ONE TIME DAILY 09/01/15  Yes Romero BellingEllison, Sean, MD  torsemide (DEMADEX) 100 MG tablet Take 100 mg by mouth daily. 10/18/16  Yes [provider]  vitamin E 400 UNIT capsule Take 400 Units by mouth daily.    Yes [provider]  furosemide (LASIX) 80 MG tablet Take 1 tablet (80 mg total) by mouth 2 (two) times daily. Patient not taking: Reported on 10/31/2016 08/30/16   Romero BellingEllison, Sean, MD  metolazone (ZAROXOLYN) 2.5 MG tablet TAKE 1 TABLET EVERY DAY Patient not taking: Reported on 10/31/2016 10/03/15   Romero BellingEllison, Sean, MD    Physical Exam: Vitals:   10/31/16 1220 10/31/16 1536 10/31/16 1714 10/31/16 1755  BP: (!) 114/53 (!) 132/48  (!) 96/38  Pulse: 71 73  70  Resp: (!) 22 (!) 24  18  Temp: 97.7 F (36.5 C)     TempSrc: Oral     SpO2: 97% 95%  93%  Weight:   113.1 kg (249 lb 7 oz)   Height:   5\' 2"  (1.575 m)     Constitutional: NAD, calm, comfortable Eyes: PERRL, lids and conjunctivae normal ENMT: Mucous membranes are moist. Posterior pharynx clear of any exudate or lesions. Neck: normal, supple, no masses, no thyromegaly Respiratory: Decreased breath sounds on bases with rales, no wheezing, no rhonchi. Normal respiratory effort. No accessory muscle use.  Cardiovascular: Regular rate and rhythm, frequent PVCs, no murmurs / rubs / gallops. 2 + lower extremity edema. 2+ pedal pulses. No carotid bruits.  Abdomen: Obese, anasarca, soft, no tenderness, no masses palpated. No hepatosplenomegaly. Bowel sounds positive.  Musculoskeletal: no clubbing / cyanosis.  Good ROM, no contractures. Normal muscle tone.  Skin: Positive ecchymosis on forearm areas, apparently from the patient's scratching on them.. Neurologic: CN 2-12 grossly intact. Sensation intact, DTR normal. Strength 5/5 in all 4.  Psychiatric: Normal judgment and insight. Alert and  oriented x 4.. Normal mood.    Labs on Admission: I have personally reviewed following labs and imaging studies  CBC:  Recent Labs Lab 10/31/16 1551  WBC 5.6  HGB 11.9*  HCT 36.8  MCV 95.8  PLT 159   Basic Metabolic Panel:  Recent Labs Lab 10/31/16 1551  NA 139  K 4.7  CL 107  CO2 21*  GLUCOSE 79  BUN 96*  CREATININE 3.32*  CALCIUM 9.2   GFR: Estimated Creatinine Clearance: 19.5 mL/min (A) (by C-G formula based on SCr of 3.32 mg/dL (H)). Liver Function Tests:  Recent Labs Lab 10/31/16 1551  AST 40  ALT 18  ALKPHOS 42  BILITOT 1.1  PROT 7.8  ALBUMIN 4.0    Recent Labs Lab 10/31/16 1551  LIPASE 36   No results for input(s): AMMONIA in the last 168 hours. Coagulation Profile: No  results for input(s): INR, PROTIME in the last 168 hours. Cardiac Enzymes:  Recent Labs Lab 10/31/16 1551  TROPONINI 0.05*   BNP (last 3 results)  Recent Labs  08/30/16 1454  PROBNP 1,539.0*   HbA1C: No results for input(s): HGBA1C in the last 72 hours. CBG: No results for input(s): GLUCAP in the last 168 hours. Lipid Profile: No results for input(s): CHOL, HDL, LDLCALC, TRIG, CHOLHDL, LDLDIRECT in the last 72 hours. Thyroid Function Tests: No results for input(s): TSH, T4TOTAL, FREET4, T3FREE, THYROIDAB in the last 72 hours. Anemia Panel: No results for input(s): VITAMINB12, FOLATE, FERRITIN, TIBC, IRON, RETICCTPCT in the last 72 hours. Urine analysis:    Component Value Date/Time   COLORURINE YELLOW 10/31/2016 1353   APPEARANCEUR CLEAR 10/31/2016 1353   LABSPEC 1.010 10/31/2016 1353   PHURINE 5.0 10/31/2016 1353   GLUCOSEU NEGATIVE 10/31/2016 1353   GLUCOSEU NEGATIVE 09/04/2013 1025   HGBUR NEGATIVE 10/31/2016 1353   BILIRUBINUR NEGATIVE 10/31/2016 1353   KETONESUR NEGATIVE 10/31/2016 1353   PROTEINUR NEGATIVE 10/31/2016 1353   UROBILINOGEN 0.2 09/04/2013 1025   NITRITE NEGATIVE 10/31/2016 1353   LEUKOCYTESUR NEGATIVE 10/31/2016 1353    Radiological  Exams on Admission: Dg Chest 2 View  Result Date: 10/31/2016 CLINICAL DATA:  Bilateral leg swelling with nausea EXAM: CHEST  2 VIEW COMPARISON:  09/09/2013 FINDINGS: Chronic cardiopericardial enlargement. Status post CABG. Chronic vascular pedicle widening. Pulmonary vascular congestion without Kerley lines or effusion. No air bronchogram or pneumothorax. IMPRESSION: Cardiomegaly and vascular congestion. Electronically Signed   By: Marnee Spring M.D.   On: 10/31/2016 13:04    EKG: Independently reviewed Vent. rate 73 BPM PR interval * ms QRS duration 179 ms QT/QTc 484/534 ms P-R-T axes 71 270 87 Age not entered, assumed to be 66 years old for purpose of ECG interpretation Sinus rhythm Left bundle branch block  Assessment/Plan Principal Problem:   Acute on chronic systolic congestive heart failure (HCC) Admit to telemetry/observation. Continue supplemental oxygen. Fluid restriction to 1500 mL daily. Continue furosemide 80 mg IVP every 8 hours. Monitor intake and output. Daily weights. Monitor renal function and electrolytes. Trend troponin levels Check echocardiogram in a.m.  Active Problems:   Acute on chronic renal insufficiency Per patient, she has been following with nephrology recently. Hold enalapril. Follow-up renal function and electrolytes in a.m.      CAD (coronary artery disease) Continue aspirin. Hold carvedilol due to acute CHF. Resume carvedilol once clinically better. Continue simvastatin 40 mg by mouth daily.    Elevated troponin Trend troponin levels. Echocardiogram to be done in the morning.    Essential hypertension, benign Hold carvedilol and lisinopril. Monitor blood pressure, electrolytes and renal function.    OSA (obstructive sleep apnea) Not on CPAP or nocturnal oxygen at home. Per patient, she discussed with her PCP getting sleep studies as an outpatient.    Anemia Monitor hematocrit and hemoglobin. Normal iron studies in  2015. Recheck anemia panel.    Hyperlipidemia Continue simvastatin 40 mg by mouth daily. Monitor LFTs periodically.    Type 2 diabetes mellitus (HCC) Recently her long-acting insulin has been decreased to 12 units to avoid hypoglycemia. Continue carbohydrate modified diet. CBG monitoring a regular insulin sliding scale.    DVT prophylaxis: Heparin SQ. Code Status: Full code. Family Communication: Her daughter [name] was pressing in the emergency department. Disposition Plan: Admit for CHF treatment and evaluation. Consults called:  Admission status: Inpatient/MedSurg   Bobette Mo MD [facility] Pager [phone]  If 7PM-7AM, please  contact night-coverage [email or web address] Password [facility]  10/31/2016, 7:45 PM

## 2016-10-31 NOTE — ED Provider Notes (Signed)
  Face-to-face evaluation   History: She presents for shortness of breath, dizziness, rash on arms, and leg swelling, which have not improved and worsened, since starting torsemide, 9 days ago.  She has chronic kidney disease.  She was changed from Lasix to torsemide 9 days ago.  Physical exam: Obese elderly appearing female.  Lungs with scattered rhonchi.  Extremities mild peripheral edema.  Arms with nonspecific petechiae, which have excoriations after patient scratched them.  Medical screening examination/treatment/procedure(s) were conducted as a shared visit with non-physician practitioner(s) and myself.  I personally evaluated the patient during the encounter   Mancel BaleWentz, Shari Natt, MD 11/01/16 1719

## 2016-10-31 NOTE — ED Notes (Signed)
Bed: [facility] Expected date:  Expected time:  Means of arrival:  Comments: 

## 2016-10-31 NOTE — ED Provider Notes (Signed)
[facility]-EMERGENCY DEPT Provider Note   CSN: [id number] Arrival date & time: 10/31/16  1150   History   Chief Complaint Chief Complaint  Patient presents with  . Leg Swelling  . Shortness of Breath    HPI [name] is a 66 y.o. female who presents with SOB and bilateral leg swelling. PMH significant for for CAD s/p CABG, CHF EF 15-20% (no AICD), hx of CVA, insulin dependent DM. She is with her daughter who she lives with. She states she has had worsening swelling in her bilateral legs over the past month. She had been increasing her Lasix with no success. She saw her PCP who referred her to Nephrology since her SCr had jumped to 2.97. She saw Dr. Signe Colt at the beginning of July who changed her Lasix to Torsemide 100mg  1QD. She has been taking this with minimal relief as well. The swelling extends from her feet to her thighs and now she has abdominal swelling and worsening SOB with activity. This morning she felt palpitations when she got up to eat breakfast and it took 30 minutes to bring her HR down after resting. She denies fever, chest pain, cough, abdominal pain, N/V/D, dysuria. Daughter also notes a rash on the patient's bilateral forearms which is itchy. They are concerned about a rash from the Torsemide. Weight is up from 233 in May to 249 today.  PCP: Dr. Everardo All Cardiologist: Dr. Antoine Poche Kidney: Dr. Signe Colt  HPI  Past Medical History:  Diagnosis Date  . CAD (coronary artery disease)   . CHF (congestive heart failure) (HCC)    EF 25%  . Cholelithiasis   . CVA (cerebral vascular accident) (HCC)   . Depression   . Diabetes mellitus   . Diabetic nephropathy (HCC)   . Diabetic retinopathy   . Hyperlipemia   . OSA (obstructive sleep apnea) 01/17/2011    Patient Active Problem List   Diagnosis Date Noted  . Anemia 08/30/2016  . Tongue swelling 07/22/2015  . Pain of right thumb 06/28/2014  . Rectal bleed 01/26/2014  . Chest pain, unspecified 09/08/2013  .  Right-sided chest wall pain 09/04/2013  . Unspecified disorder of urethra and urinary tract 09/04/2013  . Menopausal state 08/19/2013  . Skin nodule 08/19/2013  . Diabetes (HCC) 05/11/2013  . Dyspnea 04/21/2012  . Low back pain 08/13/2011  . Renal insufficiency 05/02/2011  . Encounter for long-term (current) use of other medications 05/02/2011  . Confusional arousals 03/14/2011  . Facial ecchymoses in the setting of coagulopathy 01/30/2011  . OSA (obstructive sleep apnea) 01/17/2011  . Coagulopathy (HCC) 01/17/2011  . Falls frequently 01/17/2011  . ESSENTIAL HYPERTENSION, BENIGN 11/02/2009  . SKIN DISORDER 10/07/2009  . CAD 12/03/2008  . CAROTID BRUIT 12/03/2008  . HYPOKALEMIA 10/26/2008  . BEN HYPERTENSIVE HEART DISEASE W/HEART FAILURE 08/12/2008  . CHRONIC SYSTOLIC HEART FAILURE 08/12/2008  . CVA 08/12/2008  . DISTURBANCE OF SKIN SENSATION 08/12/2008  . Congestive heart failure (HCC) 04/23/2007  . NEPHROPATHY, DIABETIC 01/10/2007  . DIABETIC  RETINOPATHY 01/10/2007  . HYPERLIPIDEMIA 01/10/2007  . MORBID OBESITY 01/10/2007  . DEPRESSION 01/10/2007  . CHOLELITHIASIS 01/10/2007    Past Surgical History:  Procedure Laterality Date  . ADENOIDECTOMY    . c-sections     x2  . CHOLECYSTECTOMY    . CORONARY ARTERY BYPASS GRAFT     X 5  . TONSILLECTOMY    . TRACHEOSTOMY    . WISDOM TOOTH EXTRACTION     x3    OB History  No data available       Home Medications    Prior to Admission medications   Medication Sig Start Date End Date Taking? Authorizing Provider  Ascorbic Acid (VITAMIN C) 500 MG tablet Take 500 mg by mouth daily. Reported on 09/05/2015   Yes [provider]  aspirin 81 MG tablet Take 81 mg by mouth daily.     Yes [provider]  Calcium-Phosphorus-Vitamin D (CALCIUM GUMMIES PO) Take 1 tablet by mouth daily. Reported on 07/22/2015   Yes [provider]  carvedilol (COREG) 3.125 MG tablet TAKE ONE TABLET BY MOUTH TWICE DAILY  05/25/15  Yes Romero Belling, MD  enalapril (VASOTEC) 10 MG tablet TAKE 1 TABLET BY MOUTH TWICE A DAY 10/19/16  Yes Romero Belling, MD  insulin NPH-regular Human (HUMULIN 70/30) (70-30) 100 UNIT/ML injection Inject 12 Units into the skin daily with breakfast. 08/30/16  Yes Romero Belling, MD  Multiple Vitamin (MULTIVITAMIN) tablet Take 1 tablet by mouth daily. Reported on 09/05/2015   Yes [provider]  nitroGLYCERIN (NITROSTAT) 0.4 MG SL tablet Place 1 tablet (0.4 mg total) under the tongue every 5 (five) minutes as needed for chest pain. 04/30/12  Yes Rollene Rotunda, MD  oxybutynin (DITROPAN-XL) 10 MG 24 hr tablet TAKE 1 TABLET (10 MG TOTAL) BY MOUTH DAILY. 10/18/16  Yes Romero Belling, MD  simvastatin (ZOCOR) 40 MG tablet TAKE ONE TABLET BY MOUTH ONE TIME DAILY 09/01/15  Yes Romero Belling, MD  torsemide (DEMADEX) 100 MG tablet Take 100 mg by mouth daily. 10/18/16  Yes [provider]  vitamin E 400 UNIT capsule Take 400 Units by mouth daily.    Yes [provider]  furosemide (LASIX) 80 MG tablet Take 1 tablet (80 mg total) by mouth 2 (two) times daily. Patient not taking: Reported on 10/31/2016 08/30/16   Romero Belling, MD  metolazone (ZAROXOLYN) 2.5 MG tablet TAKE 1 TABLET EVERY DAY Patient not taking: Reported on 10/31/2016 10/03/15   Romero Belling, MD    Family History Family History  Problem Relation Age of Onset  . Hypertension Mother   . Diabetes Mother   . Heart disease Mother   . Heart disease Father        Cardiovascular accident    Social History Social History  Substance Use Topics  . Smoking status: Former Smoker    Years: 1.00    Types: Cigarettes    Quit date: 09/13/1968  . Smokeless tobacco: Never Used     Comment: occasional  . Alcohol use No     Allergies   Okra   Review of Systems Review of Systems  Constitutional: Negative for fever.  Respiratory: Positive for shortness of breath. Negative for cough.   Cardiovascular: Positive for leg  swelling. Negative for chest pain and palpitations.  Gastrointestinal: Positive for abdominal distention. Negative for abdominal pain, diarrhea, nausea and vomiting.  Genitourinary: Negative for dysuria.  Skin: Positive for rash.       +itching     Physical Exam Updated Vital Signs BP (!) 132/48 (BP Location: Right Arm)   Pulse 73   Temp 97.7 F (36.5 C) (Oral)   Resp (!) 24   SpO2 95%   Physical Exam  Constitutional: She is oriented to person, place, and time. She appears well-developed and well-nourished. No distress.  Obese, pleasant, NAD. Anasarca noted  HENT:  Head: Normocephalic and atraumatic.  Eyes: Pupils are equal, round, and reactive to light. Conjunctivae are normal. Right eye exhibits no discharge. Left  eye exhibits no discharge. No scleral icterus.  Neck: Normal range of motion.  Cardiovascular: Normal rate and regular rhythm.  Exam reveals no gallop and no friction rub.   No murmur heard. Pulmonary/Chest: Tachypnea (mild) noted. No respiratory distress. She has no wheezes. She has rales (bilateral bibasilar rales). She exhibits no tenderness.  Abdominal: Soft. Bowel sounds are normal. She exhibits distension. She exhibits no mass. There is no tenderness. There is no rebound and no guarding. No hernia.  Musculoskeletal:  Marked edema of bilateral legs extending in to thighs  Neurological: She is alert and oriented to person, place, and time.  Skin: Skin is warm and dry. Rash (petechial rash on bilateral forearms) noted.  Psychiatric: She has a normal mood and affect. Her behavior is normal.  Nursing note and vitals reviewed.    ED Treatments / Results  Labs (all labs ordered are listed, but only abnormal results are displayed) Labs Reviewed  COMPREHENSIVE METABOLIC PANEL - Abnormal; Notable for the following:       Result Value   CO2 21 (*)    BUN 96 (*)    Creatinine, Ser 3.32 (*)    GFR calc non Af Amer 13 (*)    GFR calc Af Amer 15 (*)    All other  components within normal limits  CBC - Abnormal; Notable for the following:    RBC 3.84 (*)    Hemoglobin 11.9 (*)    RDW 16.4 (*)    All other components within normal limits  BRAIN NATRIURETIC PEPTIDE - Abnormal; Notable for the following:    B Natriuretic Peptide 1,780.5 (*)    All other components within normal limits  TROPONIN I - Abnormal; Notable for the following:    Troponin I 0.05 (*)    All other components within normal limits  LIPASE, BLOOD  URINALYSIS, ROUTINE W REFLEX MICROSCOPIC    EKG  EKG Interpretation None       Radiology Dg Chest 2 View  Result Date: 10/31/2016 CLINICAL DATA:  Bilateral leg swelling with nausea EXAM: CHEST  2 VIEW COMPARISON:  09/09/2013 FINDINGS: Chronic cardiopericardial enlargement. Status post CABG. Chronic vascular pedicle widening. Pulmonary vascular congestion without Kerley lines or effusion. No air bronchogram or pneumothorax. IMPRESSION: Cardiomegaly and vascular congestion. Electronically Signed   By: Marnee SpringJonathon  Watts M.D.   On: 10/31/2016 13:04    Procedures Procedures (including critical care time)  Medications Ordered in ED Medications  furosemide (LASIX) 160 mg in dextrose 5 % 50 mL IVPB (160 mg Intravenous New Bag/Given 10/31/16 1730)     Initial Impression / Assessment and Plan / ED Course  I have reviewed the triage vital signs and the nursing notes.  Pertinent labs & imaging results that were available during my care of the patient were reviewed by me and considered in my medical decision making (see chart for details).  66 year old female presents with anasarca which is likely multifactorial due to CHF and worsening CKD. CBC is remarkable for mild anemia. CMP remarkable for worsening kidney function. SCr was 2.97 in May and is now 3.3. BNP is 1780 with no previous value, Trop is 0.05. Do not suspect ACS. EKG shows SR with LBBB. CXR shows cardiomegaly with vascular congestion. UA is clean. Her weight is up and swelling  is worsening despite diuretics. Shared visit with Dr. Effie ShyWentz. Will admit for diuresis. Spoke with Dr. Robb Matarrtiz who will admit.  Final Clinical Impressions(s) / ED Diagnoses   Final diagnoses:  Anasarca  Chronic kidney disease (CKD), stage IV (severe) (HCC)  Acute systolic congestive heart failure [facility])    New Prescriptions New Prescriptions   No medications on file     Beryle Quant 11/01/16 0041    Mancel Bale, MD 11/01/16 1718

## 2016-11-01 ENCOUNTER — Observation Stay (HOSPITAL_COMMUNITY): Payer: Medicare Other

## 2016-11-01 DIAGNOSIS — Z8249 Family history of ischemic heart disease and other diseases of the circulatory system: Secondary | ICD-10-CM | POA: Diagnosis not present

## 2016-11-01 DIAGNOSIS — I2510 Atherosclerotic heart disease of native coronary artery without angina pectoris: Secondary | ICD-10-CM | POA: Diagnosis present

## 2016-11-01 DIAGNOSIS — N289 Disorder of kidney and ureter, unspecified: Secondary | ICD-10-CM | POA: Diagnosis not present

## 2016-11-01 DIAGNOSIS — E785 Hyperlipidemia, unspecified: Secondary | ICD-10-CM | POA: Diagnosis present

## 2016-11-01 DIAGNOSIS — Z79899 Other long term (current) drug therapy: Secondary | ICD-10-CM | POA: Diagnosis not present

## 2016-11-01 DIAGNOSIS — I340 Nonrheumatic mitral (valve) insufficiency: Secondary | ICD-10-CM

## 2016-11-01 DIAGNOSIS — R748 Abnormal levels of other serum enzymes: Secondary | ICD-10-CM | POA: Diagnosis not present

## 2016-11-01 DIAGNOSIS — Z794 Long term (current) use of insulin: Secondary | ICD-10-CM | POA: Diagnosis not present

## 2016-11-01 DIAGNOSIS — I5023 Acute on chronic systolic (congestive) heart failure: Secondary | ICD-10-CM

## 2016-11-01 DIAGNOSIS — N184 Chronic kidney disease, stage 4 (severe): Secondary | ICD-10-CM | POA: Diagnosis not present

## 2016-11-01 DIAGNOSIS — Z833 Family history of diabetes mellitus: Secondary | ICD-10-CM | POA: Diagnosis not present

## 2016-11-01 DIAGNOSIS — E1129 Type 2 diabetes mellitus with other diabetic kidney complication: Secondary | ICD-10-CM | POA: Diagnosis not present

## 2016-11-01 DIAGNOSIS — R296 Repeated falls: Secondary | ICD-10-CM | POA: Diagnosis present

## 2016-11-01 DIAGNOSIS — M7989 Other specified soft tissue disorders: Secondary | ICD-10-CM | POA: Diagnosis not present

## 2016-11-01 DIAGNOSIS — E11319 Type 2 diabetes mellitus with unspecified diabetic retinopathy without macular edema: Secondary | ICD-10-CM | POA: Diagnosis present

## 2016-11-01 DIAGNOSIS — I10 Essential (primary) hypertension: Secondary | ICD-10-CM | POA: Diagnosis not present

## 2016-11-01 DIAGNOSIS — B372 Candidiasis of skin and nail: Secondary | ICD-10-CM | POA: Diagnosis not present

## 2016-11-01 DIAGNOSIS — N179 Acute kidney failure, unspecified: Secondary | ICD-10-CM | POA: Diagnosis present

## 2016-11-01 DIAGNOSIS — Z951 Presence of aortocoronary bypass graft: Secondary | ICD-10-CM | POA: Diagnosis not present

## 2016-11-01 DIAGNOSIS — Z6841 Body Mass Index (BMI) 40.0 and over, adult: Secondary | ICD-10-CM | POA: Diagnosis not present

## 2016-11-01 DIAGNOSIS — F3289 Other specified depressive episodes: Secondary | ICD-10-CM | POA: Diagnosis not present

## 2016-11-01 DIAGNOSIS — D631 Anemia in chronic kidney disease: Secondary | ICD-10-CM | POA: Diagnosis present

## 2016-11-01 DIAGNOSIS — Z8673 Personal history of transient ischemic attack (TIA), and cerebral infarction without residual deficits: Secondary | ICD-10-CM | POA: Diagnosis not present

## 2016-11-01 DIAGNOSIS — F329 Major depressive disorder, single episode, unspecified: Secondary | ICD-10-CM | POA: Diagnosis present

## 2016-11-01 DIAGNOSIS — E119 Type 2 diabetes mellitus without complications: Secondary | ICD-10-CM

## 2016-11-01 DIAGNOSIS — N189 Chronic kidney disease, unspecified: Secondary | ICD-10-CM | POA: Diagnosis not present

## 2016-11-01 DIAGNOSIS — E1122 Type 2 diabetes mellitus with diabetic chronic kidney disease: Secondary | ICD-10-CM | POA: Diagnosis present

## 2016-11-01 DIAGNOSIS — I130 Hypertensive heart and chronic kidney disease with heart failure and stage 1 through stage 4 chronic kidney disease, or unspecified chronic kidney disease: Secondary | ICD-10-CM | POA: Diagnosis present

## 2016-11-01 DIAGNOSIS — R809 Proteinuria, unspecified: Secondary | ICD-10-CM

## 2016-11-01 DIAGNOSIS — G4733 Obstructive sleep apnea (adult) (pediatric): Secondary | ICD-10-CM | POA: Diagnosis present

## 2016-11-01 DIAGNOSIS — E1121 Type 2 diabetes mellitus with diabetic nephropathy: Secondary | ICD-10-CM | POA: Diagnosis present

## 2016-11-01 DIAGNOSIS — I447 Left bundle-branch block, unspecified: Secondary | ICD-10-CM | POA: Diagnosis present

## 2016-11-01 DIAGNOSIS — Z7982 Long term (current) use of aspirin: Secondary | ICD-10-CM | POA: Diagnosis not present

## 2016-11-01 LAB — BASIC METABOLIC PANEL
Anion gap: 11 (ref 5–15)
BUN: 96 mg/dL — ABNORMAL HIGH (ref 6–20)
CHLORIDE: 106 mmol/L (ref 101–111)
CO2: 23 mmol/L (ref 22–32)
CREATININE: 3.25 mg/dL — AB (ref 0.44–1.00)
Calcium: 9 mg/dL (ref 8.9–10.3)
GFR calc non Af Amer: 14 mL/min — ABNORMAL LOW (ref >60)
GFR, EST AFRICAN AMERICAN: 16 mL/min — AB (ref >60)
Glucose, Bld: 86 mg/dL (ref 65–99)
POTASSIUM: 3.9 mmol/L (ref 3.5–5.1)
SODIUM: 140 mmol/L (ref 135–145)

## 2016-11-01 LAB — RETICULOCYTES
RBC.: 3.59 MIL/uL — ABNORMAL LOW (ref 3.87–5.11)
RETIC COUNT ABSOLUTE: 64.6 10*3/uL (ref 19.0–186.0)
Retic Ct Pct: 1.8 % (ref 0.4–3.1)

## 2016-11-01 LAB — GLUCOSE, CAPILLARY
GLUCOSE-CAPILLARY: 138 mg/dL — AB (ref 65–99)
GLUCOSE-CAPILLARY: 101 mg/dL — AB (ref 65–99)
Glucose-Capillary: 78 mg/dL (ref 65–99)
Glucose-Capillary: 102 mg/dL — ABNORMAL HIGH (ref 65–99)

## 2016-11-01 LAB — FERRITIN: Ferritin: 93 ng/mL (ref 11–307)

## 2016-11-01 LAB — ECHOCARDIOGRAM COMPLETE
HEIGHTINCHES: 62 in
WEIGHTICAEL: 3982.39 [oz_av]

## 2016-11-01 LAB — SODIUM, URINE, RANDOM: SODIUM UR: 57 mmol/L

## 2016-11-01 LAB — TROPONIN I
Troponin I: 0.05 ng/mL (ref <0.03)
Troponin I: 0.05 ng/mL (ref <0.03)

## 2016-11-01 LAB — IRON AND TIBC
IRON: 38 ug/dL (ref 28–170)
SATURATION RATIOS: 12 % (ref 10.4–31.8)
TIBC: 329 ug/dL (ref 250–450)
UIBC: 291 ug/dL

## 2016-11-01 LAB — VITAMIN B12
VITAMIN B 12: 1451 pg/mL — AB (ref 180–914)
Vitamin B-12: 1391 pg/mL — ABNORMAL HIGH (ref 180–914)

## 2016-11-01 LAB — MAGNESIUM: Magnesium: 2.3 mg/dL (ref 1.7–2.4)

## 2016-11-01 LAB — FOLATE: FOLATE: 26 ng/mL (ref >5.9)

## 2016-11-01 MED ORDER — METOLAZONE 5 MG PO TABS
5.0000 mg | ORAL_TABLET | Freq: Every day | ORAL | Status: DC
  Administered 2016-11-01 – 2016-11-02 (×2): 5 mg via ORAL
  Filled 2016-11-01 (×4): qty 1

## 2016-11-01 MED ORDER — ACETAMINOPHEN 325 MG PO TABS
650.0000 mg | ORAL_TABLET | Freq: Four times a day (QID) | ORAL | Status: DC | PRN
  Administered 2016-11-01 – 2016-11-03 (×3): 650 mg via ORAL
  Filled 2016-11-01 (×3): qty 2

## 2016-11-01 MED ORDER — FUROSEMIDE 10 MG/ML IJ SOLN
80.0000 mg | Freq: Three times a day (TID) | INTRAMUSCULAR | Status: DC
  Administered 2016-11-01 – 2016-11-02 (×5): 80 mg via INTRAVENOUS
  Filled 2016-11-01 (×5): qty 8

## 2016-11-01 NOTE — Progress Notes (Signed)
  Echocardiogram 2D Echocardiogram has been performed.  Pieter PartridgeBrooke S Francy Mcilvaine 11/01/2016, 2:33 PM

## 2016-11-01 NOTE — Evaluation (Signed)
Physical Therapy Evaluation Patient Details Name: [name] MuraRobin M [name] MRN: [id number] DOB: Jul 19, 1950 Today's Date: 11/01/2016   History of Present Illness  66 y.o. female past medical history coronary artery C status post CABG, chronic systolic heart failure with an EF of 30%, diabetes mellitus type 2 with diabetic nephropathy and rectum adenopathy comes to the emergency room complaining of worsening shortness of breath and lower extremity edema, associated with orthopnea and fatigue  Clinical Impression  Pt admitted with above diagnosis. Pt currently with functional limitations due to the deficits listed below (see PT Problem List). Pt ambulated 50' with RW, distance limited by fatigue. HHPT recommended.  Pt will benefit from skilled PT to increase their independence and safety with mobility to allow discharge to the venue listed below.       Follow Up Recommendations Home health PT    Equipment Recommendations  None recommended by PT    Recommendations for Other Services       Precautions / Restrictions Precautions Precautions: Fall Precaution Comments: h/o 3 falls in past 1 year Restrictions Weight Bearing Restrictions: No      Mobility  Bed Mobility               General bed mobility comments: up on commode  Transfers Overall transfer level: Needs assistance Equipment used: Rolling walker (2 wheeled) Transfers: Sit to/from Stand Sit to Stand: Supervision         General transfer comment: supervision for safety   Ambulation/Gait Ambulation/Gait assistance: Min guard Ambulation Distance (Feet): 50 Feet Assistive device: Rolling walker (2 wheeled) Gait Pattern/deviations: Step-through pattern;Decreased step length - right;Decreased step length - left   Gait velocity interpretation: Below normal speed for age/gender General Gait Details: 3 standing rest breaks 2* fatigue, SaO2 92% on RA, 2/4 dyspnea  Stairs            Wheelchair Mobility     Modified Rankin (Stroke Patients Only)       Balance Overall balance assessment: History of Falls;Needs assistance   Sitting balance-Leahy Scale: Good       Standing balance-Leahy Scale: Fair                               Pertinent Vitals/Pain Pain Assessment: No/denies pain    Home Living Family/patient expects to be discharged to:: Private residence Living Arrangements: Children Available Help at Discharge: Family         Home Layout: Two level;Bed/bath upstairs Home Equipment: Shower seat;Grab bars - tub/shower;Walker - 2 wheels      Prior Function Level of Independence: Needs assistance         Comments: holds onto walls/furniture (doesn't use RW), daughter assists with shower transfer; lives with daughter who works during day     Higher education careers adviserHand Dominance        Extremity/Trunk Assessment   Upper Extremity Assessment Upper Extremity Assessment: Overall WFL for tasks assessed    Lower Extremity Assessment Lower Extremity Assessment: Overall WFL for tasks assessed (B knee ext & ankle DF 4/5)    Cervical / Trunk Assessment Cervical / Trunk Assessment: Normal  Communication   Communication: No difficulties  Cognition Arousal/Alertness: Awake/alert Behavior During Therapy: WFL for tasks assessed/performed Overall Cognitive Status: Within Functional Limits for tasks assessed  General Comments      Exercises     Assessment/Plan    PT Assessment Patient needs continued PT services  PT Problem List Decreased strength;Decreased activity tolerance;Decreased balance;Decreased mobility;Obesity;Cardiopulmonary status limiting activity       PT Treatment Interventions DME instruction;Gait training;Stair training;Therapeutic exercise;Therapeutic activities;Patient/family education    PT Goals (Current goals can be found in the Care Plan section)  Acute Rehab PT Goals Patient Stated Goal: go  home PT Goal Formulation: With patient Time For Goal Achievement: 11/15/16 Potential to Achieve Goals: Good    Frequency Min 3X/week   Barriers to discharge        Co-evaluation               AM-PAC PT "6 Clicks" Daily Activity  Outcome Measure Difficulty turning over in bed (including adjusting bedclothes, sheets and blankets)?: A Little Difficulty moving from lying on back to sitting on the side of the bed? : A Little Difficulty sitting down on and standing up from a chair with arms (e.g., wheelchair, bedside commode, etc,.)?: A Little Help needed moving to and from a bed to chair (including a wheelchair)?: A Little Help needed walking in hospital room?: A Little Help needed climbing 3-5 steps with a railing? : A Lot 6 Click Score: 17    End of Session Equipment Utilized During Treatment: Gait belt Activity Tolerance: Patient tolerated treatment well Patient left: in chair;with call bell/phone within reach Nurse Communication: Mobility status PT Visit Diagnosis: History of falling (Z91.81);Difficulty in walking, not elsewhere classified (R26.2)    Time: 1610-9604 PT Time Calculation (min) (ACUTE ONLY): 20 min   Charges:   PT Evaluation $PT Eval Moderate Complexity: 1 Procedure     PT G Codes:          Tamala Ser 11/01/2016, 12:59 PM [phone]

## 2016-11-01 NOTE — Progress Notes (Signed)
CRITICAL VALUE ALERT  Critical Value:  Troponin 0.05  Date & Time Notied:  11/01/16  Provider Notified: David StallFeliz Ortiz  Orders Received/Actions taken: Waiting for response

## 2016-11-01 NOTE — Progress Notes (Signed)
11/01/16  MD called back. No new orders for elevated Troponin 0.05

## 2016-11-01 NOTE — Progress Notes (Signed)
[facility] PROGRESS NOTE    Progress Note  [name]  [id number] DOB: 1951/03/16 DOA: 10/31/2016 PCP: Romero BellingEllison, Sean, MD     Brief Narrative:   [name] is an 66 y.o. female past medical history coronary artery C status post CABG, chronic systolic heart failure with an EF of 30%, diabetes mellitus type 2 with diabetic nephropathy and rectum adenopathy comes to the emergency room complaining of worsening shortness of breath and lower extremity edema, associated with orthopnea and fatigue  Assessment/Plan:   Acute on chronic systolic congestive heart failure (HCC) Unknown dry weight going back to the chart her lowest weight 103 kg. Baseline creatinine is around 1.9-2.5, on admission it was 3.2. He was started on IV Lasix every 8 hours. With poor urine output will add metolazone. She still short of breath has crackles in her lungs bilaterally and positive JVD. She's had no events on telemetry, continue fluid restrictions her cousin  Acute kidney injury on chronic renal disease stage III: Hold enalapril. Her baseline creatinine is around 1.9-2.5 on admission was 3.2. Question of due to cardiorenal syndrome continue IV diuresis.  Elevated troponins: In the setting of chronic renal disease with new acute decompensated systolic heart failure. She is chest pain-free. They have remained flat. EKG showed old left bundle branch block.  Hyperlipidemia Continue statins currently asymptomatic.  Essential hypertension, benign Seems to be controlled continue current regimen. Hold enalapril.  OSA (obstructive sleep apnea) Continue C-pap at night.  Normocytic  Anemia: MCV of 95 and elevated RDW, check B-12 and folate.  Type 2 diabetes mellitus (HCC) Continue long-acting insulin plus sliding scale.   DVT prophylaxis: lovenox Family Communication:none Disposition Plan/Barrier to D/C: home on 3-4 days Code Status:     Code Status Orders        Start     Ordered   10/31/16 2107  Full code  Continuous     10/31/16 2107    Code Status History    Date Active Date Inactive Code Status Order ID Comments User Context   This patient has a current code status but no historical code status.    Advance Directive Documentation     Most Recent Value  Type of Advance Directive  Healthcare Power of Attorney, Living will [[facility]]  Pre-existing out of facility DNR order (yellow form or pink MOST form)  -  "MOST" Form in Place?  -        IV Access:    Peripheral IV   Procedures and diagnostic studies:   Dg Chest 2 View  Result Date: 10/31/2016 CLINICAL DATA:  Bilateral leg swelling with nausea EXAM: CHEST  2 VIEW COMPARISON:  09/09/2013 FINDINGS: Chronic cardiopericardial enlargement. Status post CABG. Chronic vascular pedicle widening. Pulmonary vascular congestion without Kerley lines or effusion. No air bronchogram or pneumothorax. IMPRESSION: Cardiomegaly and vascular congestion. Electronically Signed   By: Marnee SpringJonathon  Watts M.D.   On: 10/31/2016 13:04     Medical Consultants:    None.  Anti-Infectives:   None*  Subjective:    [name] she relates her breathing is unchanged.  Objective:    Vitals:   10/31/16 1953 10/31/16 2102 11/01/16 0300 11/01/16 0500  BP: 107/63 112/67 (!) 113/59   Pulse: 67 70 68   Resp: 20 16 17    Temp: 98.1 F (36.7 C) 98.1 F (36.7 C) 98 F (36.7 C)   TempSrc: Oral Oral Oral   SpO2:  94% 99% 100%   Weight:    112.9 kg (248 lb 14.4 oz)  Height:  5\' 2"  (1.575 m)      Intake/Output Summary (Last 24 hours) at 11/01/16 0805 Last data filed at 11/01/16 0205  Gross per 24 hour  Intake               66 ml  Output              300 ml  Net             -234 ml   Filed Weights   10/31/16 1714 11/01/16 0500  Weight: 113.1 kg (249 lb 7 oz) 112.9 kg (248 lb 14.4 oz)    Exam: General exam: In no acute  distress. Respiratory system: Good air movement clear to auscultation. Cardiovascular system: Regular rate and rhythm with positive S1-S2, positive JVD. Positive hepatojugular reflux Gastrointestinal system: Abdomen is soft nontender nondistended Central nervous system: Awake alert and oriented 3, nonfocal Extremities: 2+ edema. Skin: No rashes or ulcerations. Psychiatry: Judgment and insight appear normal.   Data Reviewed:    Labs: Basic Metabolic Panel:  Recent Labs Lab 10/31/16 1551 11/01/16 0611  NA 139 140  K 4.7 3.9  CL 107 106  CO2 21* 23  GLUCOSE 79 86  BUN 96* 96*  CREATININE 3.32* 3.25*  CALCIUM 9.2 9.0  MG  --  2.3   GFR Estimated Creatinine Clearance: 19.9 mL/min (A) (by C-G formula based on SCr of 3.25 mg/dL (H)). Liver Function Tests:  Recent Labs Lab 10/31/16 1551  AST 40  ALT 18  ALKPHOS 42  BILITOT 1.1  PROT 7.8  ALBUMIN 4.0    Recent Labs Lab 10/31/16 1551  LIPASE 36   No results for input(s): AMMONIA in the last 168 hours. Coagulation profile No results for input(s): INR, PROTIME in the last 168 hours.  CBC:  Recent Labs Lab 10/31/16 1551  WBC 5.6  HGB 11.9*  HCT 36.8  MCV 95.8  PLT 159   Cardiac Enzymes:  Recent Labs Lab 10/31/16 1551 10/31/16 2238 11/01/16 0611  TROPONINI 0.05* 0.05* 0.05*   BNP (last 3 results)  Recent Labs  08/30/16 1454  PROBNP 1,539.0*   CBG:  Recent Labs Lab 10/31/16 2111 10/31/16 2301 11/01/16 0734  GLUCAP 70 99 78   D-Dimer: No results for input(s): DDIMER in the last 72 hours. Hgb A1c: No results for input(s): HGBA1C in the last 72 hours. Lipid Profile: No results for input(s): CHOL, HDL, LDLCALC, TRIG, CHOLHDL, LDLDIRECT in the last 72 hours. Thyroid function studies: No results for input(s): TSH, T4TOTAL, T3FREE, THYROIDAB in the last 72 hours.  Invalid input(s): FREET3 Anemia work up:  Recent Labs  11/01/16 0611  RETICCTPCT 1.8   Sepsis Labs:  Recent Labs Lab  10/31/16 1551  WBC 5.6   Microbiology No results found for this or any previous visit (from the past 240 hour(s)).   Medications:   . aspirin EC  81 mg Oral Daily  . chlorhexidine  15 mL Mouth Rinse BID  . furosemide  80 mg Intravenous Q8H  . heparin  5,000 Units Subcutaneous Q8H  . insulin aspart  0-9 Units Subcutaneous TID WC  . insulin aspart protamine- aspart  12 Units Subcutaneous Q breakfast  . mouth rinse  15 mL Mouth Rinse q12n4p  . oxybutynin  10 mg Oral QHS  . simvastatin  40 mg Oral q1800   Continuous Infusions:   LOS: 0 days  Marinda Elk  [facility] Pager [phone]  *Please refer to [email or web address], password [email or web address] to get updated schedule on who will round on this patient, as hospitalists switch teams weekly. If 7PM-7AM, please contact night-coverage at [email or web address], password [email or web address] for any overnight needs.  11/01/2016, 8:05 AM

## 2016-11-02 DIAGNOSIS — B372 Candidiasis of skin and nail: Secondary | ICD-10-CM

## 2016-11-02 DIAGNOSIS — F3289 Other specified depressive episodes: Secondary | ICD-10-CM

## 2016-11-02 LAB — FOLATE RBC
Folate, RBC: >1792 ng/mL (ref >498)
Hematocrit: 34.6 % (ref 34.0–46.6)

## 2016-11-02 LAB — BASIC METABOLIC PANEL
ANION GAP: 9 (ref 5–15)
BUN: 99 mg/dL — ABNORMAL HIGH (ref 6–20)
CO2: 23 mmol/L (ref 22–32)
Calcium: 9.1 mg/dL (ref 8.9–10.3)
Chloride: 106 mmol/L (ref 101–111)
Creatinine, Ser: 3.3 mg/dL — ABNORMAL HIGH (ref 0.44–1.00)
GFR calc Af Amer: 16 mL/min — ABNORMAL LOW (ref >60)
GFR, EST NON AFRICAN AMERICAN: 13 mL/min — AB (ref >60)
GLUCOSE: 103 mg/dL — AB (ref 65–99)
Potassium: 4 mmol/L (ref 3.5–5.1)
Sodium: 138 mmol/L (ref 135–145)

## 2016-11-02 LAB — GLUCOSE, CAPILLARY
GLUCOSE-CAPILLARY: 142 mg/dL — AB (ref 65–99)
GLUCOSE-CAPILLARY: 86 mg/dL (ref 65–99)
GLUCOSE-CAPILLARY: 112 mg/dL — AB (ref 65–99)
Glucose-Capillary: 181 mg/dL — ABNORMAL HIGH (ref 65–99)

## 2016-11-02 MED ORDER — FUROSEMIDE 10 MG/ML IJ SOLN
160.0000 mg | Freq: Three times a day (TID) | INTRAMUSCULAR | Status: DC
  Filled 2016-11-02: qty 16

## 2016-11-02 MED ORDER — PAROXETINE HCL 20 MG PO TABS: 20.0000 mg | ORAL_TABLET | Freq: Every day | ORAL | Status: DC

## 2016-11-02 MED ORDER — NYSTATIN 100000 UNIT/GM EX POWD
Freq: Three times a day (TID) | CUTANEOUS | Status: DC
  Administered 2016-11-02 (×2): via TOPICAL
  Administered 2016-11-03: 1 g via TOPICAL
  Administered 2016-11-04: 10:00:00 via TOPICAL
  Filled 2016-11-02: qty 15

## 2016-11-02 MED ORDER — ONDANSETRON HCL 4 MG/2ML IJ SOLN
4.0000 mg | Freq: Four times a day (QID) | INTRAMUSCULAR | Status: DC | PRN
  Administered 2016-11-02 – 2016-11-03 (×2): 4 mg via INTRAVENOUS
  Filled 2016-11-02 (×2): qty 2

## 2016-11-02 MED ORDER — SERTRALINE HCL 25 MG PO TABS
25.0000 mg | ORAL_TABLET | Freq: Every day | ORAL | Status: DC
  Administered 2016-11-02 – 2016-11-04 (×2): 25 mg via ORAL
  Filled 2016-11-02 (×3): qty 1

## 2016-11-02 MED ORDER — FUROSEMIDE 10 MG/ML IJ SOLN
160.0000 mg | Freq: Three times a day (TID) | INTRAVENOUS | Status: AC
  Administered 2016-11-02 (×2): 160 mg via INTRAVENOUS
  Filled 2016-11-02 (×2): qty 10

## 2016-11-02 NOTE — Progress Notes (Signed)
Physical Therapy Treatment Patient Details Name: [name] MRN: [id number] DOB: 04-Jan-1951 Today's Date: 11/02/2016    History of Present Illness 66 y.o. female past medical history coronary artery C status post CABG, chronic systolic heart failure with an EF of 30%, diabetes mellitus type 2 with diabetic nephropathy and rectum adenopathy comes to the emergency room complaining of worsening shortness of breath and lower extremity edema, associated with orthopnea and fatigue    PT Comments    Progressing slowly with mobility. Pt could stand to be more active. Recommend ambulation to/from bathroom with nursing assistance instead of BSC. O2 sats 94% on RA at rest, 91% on RA during ambulation, dyspnea 2/4. Encouraged pt to spend more time out of bed.     Follow Up Recommendations  Home health PT     Equipment Recommendations  None recommended by PT    Recommendations for Other Services       Precautions / Restrictions Precautions Precautions: Fall Precaution Comments: h/o 3 falls in past 1 year Restrictions Weight Bearing Restrictions: No    Mobility  Bed Mobility Overal bed mobility: Needs Assistance Bed Mobility: Sit to Supine       Sit to supine: Mod assist   General bed mobility comments: Assist for bil LEs onto bed.   Transfers Overall transfer level: Needs assistance Equipment used: Rolling walker (2 wheeled) Transfers: Sit to/from Stand Sit to Stand: Supervision         General transfer comment: supervision for safety   Ambulation/Gait Ambulation/Gait assistance: Min guard Ambulation Distance (Feet): 50 Feet Assistive device: Rolling walker (2 wheeled) Gait Pattern/deviations: Step-through pattern;Decreased stride length;Decreased dorsiflexion - left;Decreased dorsiflexion - right     General Gait Details: Little to no heel-toe gait pattern. Multple brief standing rest breaks taken due to fatigue, dyspnea. O2 sats 91% on RA, dyspnea  2/4   Stairs            Wheelchair Mobility    Modified Rankin (Stroke Patients Only)       Balance Overall balance assessment: Needs assistance           Standing balance-Leahy Scale: Poor Standing balance comment: requiring RW                            Cognition Arousal/Alertness: Awake/alert Behavior During Therapy: WFL for tasks assessed/performed Overall Cognitive Status: Within Functional Limits for tasks assessed                                        Exercises      General Comments        Pertinent Vitals/Pain Pain Assessment: No/denies pain    Home Living                      Prior Function            PT Goals (current goals can now be found in the care plan section) Progress towards PT goals: Progressing toward goals    Frequency    Min 3X/week      PT Plan Current plan remains appropriate    Co-evaluation              AM-PAC PT "6 Clicks" Daily Activity  Outcome Measure  Difficulty turning over in bed (including adjusting bedclothes, sheets and blankets)?: A Lot Difficulty moving  from lying on back to sitting on the side of the bed? : Total Difficulty sitting down on and standing up from a chair with arms (e.g., wheelchair, bedside commode, etc,.)?: A Little Help needed moving to and from a bed to chair (including a wheelchair)?: A Little Help needed walking in hospital room?: A Little Help needed climbing 3-5 steps with a railing? : A Lot 6 Click Score: 14    End of Session Equipment Utilized During Treatment: Gait belt Activity Tolerance: Patient limited by fatigue Patient left: in bed;with call bell/phone within reach   PT Visit Diagnosis: Muscle weakness (generalized) (M62.81);Difficulty in walking, not elsewhere classified (R26.2)     Time: [id number] PT Time Calculation (min) (ACUTE ONLY): 17 min  Charges:  $Gait Training: 8-22 mins                    G Codes:           Rebeca AlertJannie Cameren Earnest, MPT Pager: [phone]

## 2016-11-02 NOTE — Progress Notes (Addendum)
[facility] PROGRESS NOTE    Progress Note  [name]  [id number] DOB: 1951/01/28 DOA: 10/31/2016 PCP: Romero Belling, MD     Brief Narrative:   [name] is an 66 y.o. female past medical history coronary artery C status post CABG, chronic systolic heart failure with an EF of 30%, diabetes mellitus type 2 with diabetic nephropathy and rectum adenopathy comes to the emergency room complaining of worsening shortness of breath and lower extremity edema, associated with orthopnea and fatigue  Assessment/Plan:   Acute on chronic systolic congestive heart failure (HCC) Unknown dry weight going back to the chart her lowest weight 103 kg.her weight continues to improve slowly today is 111 kg, on admission it was 113 kg. Cr has remained stable at 3.2. He was started on IV Lasix every 8 hours. With poor urine output will add metolazone. She still short of breath has crackles in her lungs bilaterally and positive JVD. She's had no events on telemetry, continue fluid restrictions her cousin  Acute kidney injury on chronic renal disease stage III: Hold enalapril.With her low GFR she's not a candidate for ACE inhibitor at this point. Her baseline creatinine is around 2.0-2.5 on admission was 3.2.  Elevated troponins: In the setting of chronic renal disease with new acute decompensated systolic heart failure. She denied chest pain or shortness of breath today. EKG showed old left bundle branch block.  Hyperlipidemia Continue statins currently asymptomatic.  Essential hypertension, benign Seems to be controlled continue current regimen. Hold enalapril.  OSA (obstructive sleep apnea) Continue C-pap at night.  Normocytic  Anemia: MCV of 95 and elevated RDW, check B-12 and folate.  Type 2 diabetes mellitus (HCC) Continue long-acting insulin plus sliding scale. Good control.  New Depression unspecified: Started on Zoloft reevaluated by PCP in 6 weeks  and titrate as tolerated. Contacted the Child psychotherapist for social engagement in the community.  Candida intertrigo: Her groin area and behind her left knee start a statin powder 3 times a day.  Morbidly obese: Body mass index is 45.12 kg/m.   DVT prophylaxis: lovenox Family Communication:none Disposition Plan/Barrier to D/C: home on 3-4 days Code Status:     Code Status Orders        Start     Ordered   10/31/16 2107  Full code  Continuous     10/31/16 2107    Code Status History    Date Active Date Inactive Code Status Order ID Comments User Context   This patient has a current code status but no historical code status.    Advance Directive Documentation     Most Recent Value  Type of Advance Directive  Healthcare Power of Attorney, Living will [daughter states this was done [facility] years [facility]]  Pre-existing out of facility DNR order (yellow form or pink MOST form)  -  "MOST" Form in Place?  -        IV Access:    Peripheral IV   Procedures and diagnostic studies:   Dg Chest 2 View  Result Date: 10/31/2016 CLINICAL DATA:  Bilateral leg swelling with nausea EXAM: CHEST  2 VIEW COMPARISON:  09/09/2013 FINDINGS: Chronic cardiopericardial enlargement. Status post CABG. Chronic vascular pedicle widening. Pulmonary vascular congestion without Kerley lines or effusion. No air bronchogram or pneumothorax. IMPRESSION: Cardiomegaly and vascular congestion. Electronically Signed   By: Marnee Spring M.D.   On: 10/31/2016 13:04     Medical Consultants:    None.  Anti-Infectives:  None*  Subjective:    Lum BabeRobin M [name] she relates her breathing is much better compared to yesterday. She does express feelings of anhedonia  Objective:    Vitals:   11/01/16 1501 11/01/16 2322 11/02/16 0705 11/02/16 0711  BP: (!) 107/53 (!) 111/41 (!) 108/48   Pulse: 74 72 71   Resp: 18 20    Temp: 97.7 F (36.5 C) 97.8 F (36.6 C) 98.1 F (36.7 C)     TempSrc: Oral Oral Oral   SpO2: 97% 96% 95%   Weight:    111.9 kg (246 lb 11.1 oz)  Height:        Intake/Output Summary (Last 24 hours) at 11/02/16 0904 Last data filed at 11/02/16 0700  Gross per 24 hour  Intake             1160 ml  Output             1100 ml  Net               60 ml   Filed Weights   10/31/16 1714 11/01/16 0500 11/02/16 0711  Weight: 113.1 kg (249 lb 7 oz) 112.9 kg (248 lb 14.4 oz) 111.9 kg (246 lb 11.1 oz)    Exam: General exam: In no acute distress Respiratory system: Good air movement clear to auscultation. Cardiovascular system: She still have positive hepatojugular reflex, positive JVD with a regular rate and rhythm positive S1-S2 Gastrointestinal system: Positive bowel sounds soft nontender nondistended Central nervous system: Awake alert and oriented 3 Extremities: 2+ edema. Skin: Has rash probably Candida intertrigo behind hernia in her groin Psychiatry: Judgment and insight appeared normal.   Data Reviewed:    Labs: Basic Metabolic Panel:  Recent Labs Lab 10/31/16 1551 11/01/16 0611 11/02/16 0442  NA 139 140 138  K 4.7 3.9 4.0  CL 107 106 106  CO2 21* 23 23  GLUCOSE 79 86 103*  BUN 96* 96* 99*  CREATININE 3.32* 3.25* 3.30*  CALCIUM 9.2 9.0 9.1  MG  --  2.3  --    GFR Estimated Creatinine Clearance: 19.5 mL/min (A) (by C-G formula based on SCr of 3.3 mg/dL (H)). Liver Function Tests:  Recent Labs Lab 10/31/16 1551  AST 40  ALT 18  ALKPHOS 42  BILITOT 1.1  PROT 7.8  ALBUMIN 4.0    Recent Labs Lab 10/31/16 1551  LIPASE 36   No results for input(s): AMMONIA in the last 168 hours. Coagulation profile No results for input(s): INR, PROTIME in the last 168 hours.  CBC:  Recent Labs Lab 10/31/16 1551  WBC 5.6  HGB 11.9*  HCT 36.8  MCV 95.8  PLT 159   Cardiac Enzymes:  Recent Labs Lab 10/31/16 1551 10/31/16 2238 11/01/16 0611 11/01/16 1257  TROPONINI 0.05* 0.05* 0.05* 0.05*   BNP (last 3  results)  Recent Labs  08/30/16 1454  PROBNP 1,539.0*   CBG:  Recent Labs Lab 11/01/16 0734 11/01/16 1155 11/01/16 1650 11/01/16 2318 11/02/16 0817  GLUCAP 78 138* 101* 102* 142*   D-Dimer: No results for input(s): DDIMER in the last 72 hours. Hgb A1c: No results for input(s): HGBA1C in the last 72 hours. Lipid Profile: No results for input(s): CHOL, HDL, LDLCALC, TRIG, CHOLHDL, LDLDIRECT in the last 72 hours. Thyroid function studies: No results for input(s): TSH, T4TOTAL, T3FREE, THYROIDAB in the last 72 hours.  Invalid input(s): FREET3 Anemia work up:  Recent Labs  11/01/16 0611 11/01/16 0940  VITAMINB12 1,391* 1,451*  FOLATE  26.0  --   FERRITIN 93  --   TIBC 329  --   IRON 38  --   RETICCTPCT 1.8  --    Sepsis Labs:  Recent Labs Lab 10/31/16 1551  WBC 5.6   Microbiology No results found for this or any previous visit (from the past 240 hour(s)).   Medications:   . aspirin EC  81 mg Oral Daily  . chlorhexidine  15 mL Mouth Rinse BID  . furosemide  80 mg Intravenous Q8H  . heparin  5,000 Units Subcutaneous Q8H  . insulin aspart  0-9 Units Subcutaneous TID WC  . insulin aspart protamine- aspart  12 Units Subcutaneous Q breakfast  . mouth rinse  15 mL Mouth Rinse q12n4p  . metolazone  5 mg Oral Daily  . oxybutynin  10 mg Oral QHS  . simvastatin  40 mg Oral q1800   Continuous Infusions:   LOS: 1 day   Marinda Elk  [facility] Pager [phone]  *Please refer to [email or web address], password [email or web address] to get updated schedule on who will round on this patient, as hospitalists switch teams weekly. If 7PM-7AM, please contact night-coverage at [email or web address], password [email or web address] for any overnight needs.  11/02/2016, 9:04 AM

## 2016-11-02 NOTE — Care Management Note (Signed)
Case Management Note  Patient Details  Name: [name] MRN: [id number] Date of Birth: January 07, 1951  Subjective/Objective: 66 y/o f admitted w/Acute on chronic CHF. From home. PT-recc HHPT-provided w/HHC agency list-await choice. CSW cons-Adult Day Care resources for depression/socialization services.                  Action/Plan:d/c home w/HHPT.   Expected Discharge Date:   (unknown)               Expected Discharge Plan:  Home w Home Health Services  In-House Referral:     Discharge planning Services  CM Consult  Post Acute Care Choice:  Durable Medical Equipment (rw) Choice offered to:  Patient  DME Arranged:    DME Agency:     HH Arranged:    HH Agency:     Status of Service:  In process, will continue to follow  If discussed at Long Length of Stay Meetings, dates discussed:    Additional Comments:  Lanier ClamMahabir, Mikaella Escalona, RN 11/02/2016, 11:50 AM

## 2016-11-03 DIAGNOSIS — N189 Chronic kidney disease, unspecified: Secondary | ICD-10-CM

## 2016-11-03 DIAGNOSIS — N289 Disorder of kidney and ureter, unspecified: Secondary | ICD-10-CM

## 2016-11-03 LAB — BASIC METABOLIC PANEL
Anion gap: 12 (ref 5–15)
BUN: 93 mg/dL — AB (ref 6–20)
CHLORIDE: 103 mmol/L (ref 101–111)
CO2: 22 mmol/L (ref 22–32)
CREATININE: 3.25 mg/dL — AB (ref 0.44–1.00)
Calcium: 9.2 mg/dL (ref 8.9–10.3)
GFR calc Af Amer: 16 mL/min — ABNORMAL LOW (ref >60)
GFR calc non Af Amer: 14 mL/min — ABNORMAL LOW (ref >60)
GLUCOSE: 111 mg/dL — AB (ref 65–99)
POTASSIUM: 4.3 mmol/L (ref 3.5–5.1)
Sodium: 137 mmol/L (ref 135–145)

## 2016-11-03 LAB — GLUCOSE, CAPILLARY
GLUCOSE-CAPILLARY: 129 mg/dL — AB (ref 65–99)
Glucose-Capillary: 105 mg/dL — ABNORMAL HIGH (ref 65–99)
Glucose-Capillary: 183 mg/dL — ABNORMAL HIGH (ref 65–99)
Glucose-Capillary: 166 mg/dL — ABNORMAL HIGH (ref 65–99)
Glucose-Capillary: 154 mg/dL — ABNORMAL HIGH (ref 65–99)

## 2016-11-03 MED ORDER — FUROSEMIDE 10 MG/ML IJ SOLN
160.0000 mg | Freq: Three times a day (TID) | INTRAVENOUS | Status: DC
  Administered 2016-11-03: 160 mg via INTRAVENOUS
  Filled 2016-11-03 (×2): qty 16

## 2016-11-03 MED ORDER — DEXTROSE 5 % IV SOLN
160.0000 mg | Freq: Three times a day (TID) | INTRAVENOUS | Status: DC
  Filled 2016-11-03: qty 16

## 2016-11-03 MED ORDER — SODIUM CHLORIDE 0.9 % IV SOLN
8.0000 mg | Freq: Four times a day (QID) | INTRAVENOUS | Status: DC | PRN
  Filled 2016-11-03: qty 4

## 2016-11-03 NOTE — Progress Notes (Signed)
[facility] PROGRESS NOTE    Progress Note  Zyliah Schier [name]  UEA:[id number] DOB: 12/10/1950 DOA: 10/31/2016 PCP: Romero Belling, MD     Brief Narrative:   [name] is an 66 y.o. female past medical history coronary artery C status post CABG, chronic systolic heart failure with an EF of 30%, diabetes mellitus type 2 with diabetic nephropathy and rectum adenopathy comes to the emergency room complaining of worsening shortness of breath and lower extremity edema, associated with orthopnea and fatigue  Assessment/Plan:   Acute on chronic systolic congestive heart failure (HCC) Unknown dry weight, lowest weight 103 kg. On admission it was 113 kg. Cr has remained stable at 3.2. Increase Lasix and continue metolazone,  urine output has improved . She put out about 2 L. He relates breathing is better. Cont Fluid restrictions and strict I's and O's. Physical therapy evaluated the patient recommended home health PT.  Acute kidney injury on chronic renal disease stage III: Hold enalapril.With her low GFR she's not a candidate for ACE inhibitor at this point. Her baseline creatinine is around 2.0-2.5 on admission was 3.2. Cannot start BiDil borderline blood pressure. Check orthostatics.  Elevated troponins: In the setting of chronic renal disease with new acute decompensated systolic heart failure. She denied chest pain or shortness of breath today. EKG showed old left bundle branch block.  Hyperlipidemia Continue statins currently asymptomatic.  Essential hypertension, benign Seems to be controlled continue current regimen. Hold enalapril.  OSA (obstructive sleep apnea) Continue C-pap at night.  Normocytic  Anemia: MCV of 95 and elevated RDW, check B-12 and folate.  Type 2 diabetes mellitus (HCC) Continue long-acting insulin plus sliding scale. Good control.  New Depression unspecified: Started on Zoloft reevaluated by PCP in 6 weeks and titrate as  tolerated. Contacted the Child psychotherapist for social engagement in the community.  Candida intertrigo: Her groin area and behind her left knee start a statin powder 3 times a day.  Morbidly obese: Body mass index is 45.16 kg/m.   DVT prophylaxis: lovenox Family Communication:none Disposition Plan/Barrier to D/C: home on 3-4 days Code Status:     Code Status Orders        Start     Ordered   10/31/16 2107  Full code  Continuous     10/31/16 2107    Code Status History    Date Active Date Inactive Code Status Order ID Comments User Context   This patient has a current code status but no historical code status.    Advance Directive Documentation     Most Recent Value  Type of Advance Directive  Healthcare Power of Attorney, Living will [daughter states this was done at [facility] several years ago]  Pre-existing out of facility DNR order (yellow form or pink MOST form)  -  "MOST" Form in Place?  -        IV Access:    Peripheral IV   Procedures and diagnostic studies:   No results found.   Medical Consultants:    None.  Anti-Infectives:   None*  Subjective:    Cienna M [name] no new complaints today.  Objective:    Vitals:   11/02/16 1300 11/02/16 2110 11/03/16 0525 11/03/16 0642  BP: (!) 98/51 (!) 101/49 95/68   Pulse: 80 93 78   Resp: 15 20 18    Temp: (!) 97.5 F (36.4 C) 98.2 F (36.8 C) 98.4 F (36.9 C)   TempSrc: Oral Oral Oral   SpO2: 95%  91% 94%   Weight:    112 kg (246 lb 14.6 oz)  Height:        Intake/Output Summary (Last 24 hours) at 11/03/16 0835 Last data filed at 11/03/16 0643  Gross per 24 hour  Intake              540 ml  Output             1900 ml  Net            -1360 ml   Filed Weights   11/01/16 0500 11/02/16 0711 11/03/16 0642  Weight: 112.9 kg (248 lb 14.4 oz) 111.9 kg (246 lb 11.1 oz) 112 kg (246 lb 14.6 oz)    Exam: General exam: in No acute distress Respiratory system: Good air movement and  clear to auscultation. Cardiovascular system: She still have positive hepatojugular reflex, -  JVD with a regular rate and rhythm positive S1-S2 Gastrointestinal system: Positive bowel sounds soft nontender nondistended. Central nervous system: Awake alert oriented 3. Extremities: 2+ edema. Skin: Has rash probably Candida intertrigo behind hernia in her groin Psychiatry: Judgment and insight appear normal, mood and affect are appropriate.   Data Reviewed:    Labs: Basic Metabolic Panel:  Recent Labs Lab 10/31/16 1551 11/01/16 0611 11/02/16 0442 11/03/16 0422  NA 139 140 138 137  K 4.7 3.9 4.0 4.3  CL 107 106 106 103  CO2 21* 23 23 22   GLUCOSE 79 86 103* 111*  BUN 96* 96* 99* 93*  CREATININE 3.32* 3.25* 3.30* 3.25*  CALCIUM 9.2 9.0 9.1 9.2  MG  --  2.3  --   --    GFR Estimated Creatinine Clearance: 19.9 mL/min (A) (by C-G formula based on SCr of 3.25 mg/dL (H)). Liver Function Tests:  Recent Labs Lab 10/31/16 1551  AST 40  ALT 18  ALKPHOS 42  BILITOT 1.1  PROT 7.8  ALBUMIN 4.0    Recent Labs Lab 10/31/16 1551  LIPASE 36   No results for input(s): AMMONIA in the last 168 hours. Coagulation profile No results for input(s): INR, PROTIME in the last 168 hours.  CBC:  Recent Labs Lab 10/31/16 1551 11/01/16 0940  WBC 5.6  --   HGB 11.9*  --   HCT 36.8 34.6  MCV 95.8  --   PLT 159  --    Cardiac Enzymes:  Recent Labs Lab 10/31/16 1551 10/31/16 2238 11/01/16 0611 11/01/16 1257  TROPONINI 0.05* 0.05* 0.05* 0.05*   BNP (last 3 results)  Recent Labs  08/30/16 1454  PROBNP 1,539.0*   CBG:  Recent Labs Lab 11/02/16 0817 11/02/16 1241 11/02/16 1734 11/02/16 2138 11/03/16 0739  GLUCAP 142* 181* 86 112* 105*   D-Dimer: No results for input(s): DDIMER in the last 72 hours. Hgb A1c: No results for input(s): HGBA1C in the last 72 hours. Lipid Profile: No results for input(s): CHOL, HDL, LDLCALC, TRIG, CHOLHDL, LDLDIRECT in the last 72  hours. Thyroid function studies: No results for input(s): TSH, T4TOTAL, T3FREE, THYROIDAB in the last 72 hours.  Invalid input(s): FREET3 Anemia work up:  Recent Labs  11/01/16 0611 11/01/16 0940  VITAMINB12 1,391* 1,451*  FOLATE 26.0  --   FERRITIN 93  --   TIBC 329  --   IRON 38  --   RETICCTPCT 1.8  --    Sepsis Labs:  Recent Labs Lab 10/31/16 1551  WBC 5.6   Microbiology No results found for this or any previous visit (from the  past 240 hour(s)).   Medications:   . aspirin EC  81 mg Oral Daily  . chlorhexidine  15 mL Mouth Rinse BID  . heparin  5,000 Units Subcutaneous Q8H  . insulin aspart  0-9 Units Subcutaneous TID WC  . insulin aspart protamine- aspart  12 Units Subcutaneous Q breakfast  . mouth rinse  15 mL Mouth Rinse q12n4p  . metolazone  5 mg Oral Daily  . nystatin   Topical TID  . oxybutynin  10 mg Oral QHS  . sertraline  25 mg Oral Daily  . simvastatin  40 mg Oral q1800   Continuous Infusions: . furosemide       LOS: 2 days   Marinda ElkFELIZ ORTIZ, ABRAHAM  [facility] Pager [phone]  *Please refer to [email or web address], password [email or web address] to get updated schedule on who will round on this patient, as hospitalists switch teams weekly. If 7PM-7AM, please contact night-coverage at [email or web address], password [email or web address] for any overnight needs.  11/03/2016, 8:35 AM

## 2016-11-03 NOTE — Progress Notes (Signed)
After measuring pt for TED hose she needs a 3xl short.  Materials mgmt reports we do not have that size nore any size close to that.  Pt does not want PAS hose.  Have instructed to elevate.

## 2016-11-04 LAB — BASIC METABOLIC PANEL
ANION GAP: 9 (ref 5–15)
BUN: 102 mg/dL — ABNORMAL HIGH (ref 6–20)
CALCIUM: 9.5 mg/dL (ref 8.9–10.3)
CHLORIDE: 102 mmol/L (ref 101–111)
CO2: 24 mmol/L (ref 22–32)
CREATININE: 3.85 mg/dL — AB (ref 0.44–1.00)
GFR calc Af Amer: 13 mL/min — ABNORMAL LOW (ref >60)
GFR calc non Af Amer: 11 mL/min — ABNORMAL LOW (ref >60)
GLUCOSE: 119 mg/dL — AB (ref 65–99)
Potassium: 4.2 mmol/L (ref 3.5–5.1)
Sodium: 135 mmol/L (ref 135–145)

## 2016-11-04 LAB — GLUCOSE, CAPILLARY
GLUCOSE-CAPILLARY: 98 mg/dL (ref 65–99)
Glucose-Capillary: 142 mg/dL — ABNORMAL HIGH (ref 65–99)

## 2016-11-04 LAB — CBC
HCT: 36.2 % (ref 36.0–46.0)
Hemoglobin: 11.5 g/dL — ABNORMAL LOW (ref 12.0–15.0)
MCH: 30.7 pg (ref 26.0–34.0)
MCHC: 31.8 g/dL (ref 30.0–36.0)
MCV: 96.8 fL (ref 78.0–100.0)
PLATELETS: 140 10*3/uL — AB (ref 150–400)
RBC: 3.74 MIL/uL — AB (ref 3.87–5.11)
RDW: 16.4 % — AB (ref 11.5–15.5)
WBC: 5.7 10*3/uL (ref 4.0–10.5)

## 2016-11-04 MED ORDER — SODIUM CHLORIDE 0.9 % IV BOLUS (SEPSIS)
250.0000 mL | Freq: Once | INTRAVENOUS | Status: AC
  Administered 2016-11-04: 250 mL via INTRAVENOUS

## 2016-11-04 MED ORDER — NYSTATIN 100000 UNIT/GM EX POWD: Freq: Three times a day (TID) | CUTANEOUS | 0 refills | Status: AC

## 2016-11-04 MED ORDER — CARVEDILOL 3.125 MG PO TABS
3.1250 mg | ORAL_TABLET | Freq: Two times a day (BID) | ORAL | Status: DC
  Administered 2016-11-04: 3.125 mg via ORAL
  Filled 2016-11-04: qty 1

## 2016-11-04 NOTE — Care Management Important Message (Signed)
Important Message  Patient Details  Name: [name] MuraRobin M [name] MRN: [id number] Date of Birth: 07/30/1950   Medicare Important Message Given:  Yes    Elliot CousinShavis, Pressley Tadesse Ellen, RN 11/04/2016, 12:12 PM

## 2016-11-04 NOTE — Progress Notes (Signed)
Pt d/c home with daughter. D/c instructions given to daughter. Went over instructions with both patient and daughter. IV removed . Cath intact. Site without compromise. Pt stable for d/c bp 108/68 after bolus.

## 2016-11-04 NOTE — Care Management Note (Addendum)
Case Management Note  Patient Details  Name: [name] MRN: [id number] Date of Birth: [birth year]  Subjective/Objective:     CHF, DM, HTN                 Action/Plan: Discharge Planning: NCM spoke to pt. Offered choice for HH/list provided. Pt requested [facility] for [facility] PT and aide. States her dtr will borrow a RW for her from the church. States she does need 3n1 bedside commode. Contacted [facility] rep for 3n1 bedside commode for home and [facility].   PCP Romero BellingELLISON, SEAN MD  1229 NCM spoke to dtr, Marcelino DusterMichelle and she is requesting a tub bench. Pt has a shower chair at home. Updated [facility]., explained also that some DME may not be covered by her insurance. Dtr verbalized understanding.    Expected Discharge Date:  11/04/16               Expected Discharge Plan:  Home w Home Health Services  In-House Referral:  NA  Discharge planning Services  CM Consult  Post Acute Care Choice:  Durable Medical Equipment (rw) Choice offered to:  Patient  DME Arranged:  3-N-1 DME Agency:  [facility]  HH Arranged:  PT, Nurse's Aide HH Agency:  [facility]  Status of Service:  Completed, signed off  If discussed at Long Length of Stay Meetings, dates discussed:    Additional Comments:  Elliot CousinShavis, Tersa Fotopoulos Ellen, RN 11/04/2016, 12:13 PM

## 2016-11-04 NOTE — Discharge Summary (Signed)
Physician Discharge Summary  [name] ZOX:[id number] DOB: Oct 02, 1950 DOA: 10/31/2016  PCP: Romero Belling, MD  Admit date: 10/31/2016 Discharge date: 11/04/2016  Admitted From: Home Disposition:  Home  Recommendations for Outpatient Follow-up:  1. Follow up with Nephrologgist in 1-2 weeks, Will need to talk about fistula and dialysis at this appointment. 2. Check blood pressure and titrate antihypertensive medications as tolerated. 3. Please obtain BMP/CBC in one week   Home Health:Yes Equipment/Devices:None  Discharge Condition:stable CODE STATUS:full Diet recommendation: Heart Healthy   Brief/Interim Summary: 66 y.o. female past medical history coronary artery C status post CABG, chronic systolic heart failure with an EF of 30%, diabetes mellitus type 2 with diabetic nephropathy and rectum adenopathy comes to the emergency room complaining of worsening shortness of breath and lower extremity edema, associated with orthopnea and fatigue  Discharge Diagnoses:  Principal Problem:   Acute on chronic systolic congestive heart failure (HCC) Active Problems:   Hyperlipidemia   Morbid obesity (HCC)   Essential hypertension, benign   OSA (obstructive sleep apnea)   Anemia   CAD (coronary artery disease)   Acute on chronic renal insufficiency   Elevated troponin   Type 2 diabetes mellitus (HCC)   Acute on chronic systolic heart failure, NYHA class 3 (HCC)   Chronic kidney disease (CKD), stage IV (severe) (HCC)  Acute on chronic systolic congestive heart failure: She was started on IV diuretics which has to be increased and started on metolazone and she had good urine output her dyspnea and shortness of breath improved. She was fluid restricted and weighted daily her weight came down to 110 kg. She will continue torsemide as an outpatient. Physical therapy evaluated her the recommended home health PT.  Chronic renal disease stage IV: Her enalapril was DC'd due to low  GFR she's not a candidate for ACE inhibitor at this point. Her new baseline is around 3.2. Her blood pressure remains borderline in the 120s to 110s, orthostatics were checked which were negative. She will need to follow-up with her nephrologist an outpatient.  Elevated troponins: Is setting of chronic renal disease and new acute decompensated systolic heart failure: She denies any chest pain. EKG showed an old left bundle branch block her cardiac biomarkers remain flat.  Hyperlipidemia: Continue statins.  Essential hypertension: Her blood pressure was stable on torsemide. Her enalapril was DC'd due to low GFR.  Obstructive sleep apnea: She does not wear CPAP at home.  Normocytic anemia: Probably due to her renal dysfunction only to follow-up with nephrologist an outpatient.  Diabetes mellitus type 2: No changes were made.  New onset depression unspecified: The patient has anhedonia denies waking up early  in the morning. She will she doesn't have much to live on, she was started on Zoloft, so she will go was consulted for social engagement in the community. She needs to follow-up with PCP in 6 week and titrate her Zoloft as tolerated.  Candida intertrigo: In the groin area and left knee she was started on nystatin powder 3 times a day for 7 days.  Morbidly obese: Body mass index is 44.74 kg/m.    Discharge Instructions  Discharge Instructions    Diet - low sodium heart healthy    Complete by:  As directed    Increase activity slowly    Complete by:  As directed      Allergies as of 11/04/2016      Reactions   Okra Hives      Medication List  STOP taking these medications   enalapril 10 MG tablet Commonly known as:  VASOTEC   furosemide 80 MG tablet Commonly known as:  LASIX   metolazone 2.5 MG tablet Commonly known as:  ZAROXOLYN     TAKE these medications   aspirin 81 MG tablet Take 81 mg by mouth daily.   CALCIUM GUMMIES PO Take 1 tablet by  mouth daily. Reported on 07/22/2015   carvedilol 3.125 MG tablet Commonly known as:  COREG TAKE ONE TABLET BY MOUTH TWICE DAILY   insulin NPH-regular Human (70-30) 100 UNIT/ML injection Commonly known as:  HUMULIN 70/30 Inject 12 Units into the skin daily with breakfast.   multivitamin tablet Take 1 tablet by mouth daily. Reported on 09/05/2015   nitroGLYCERIN 0.4 MG SL tablet Commonly known as:  NITROSTAT Place 1 tablet (0.4 mg total) under the tongue every 5 (five) minutes as needed for chest pain.   nystatin powder Commonly known as:  MYCOSTATIN/NYSTOP Apply topically 3 (three) times daily. Applied to the posterior left knee and groin area.   oxybutynin 10 MG 24 hr tablet Commonly known as:  DITROPAN-XL TAKE 1 TABLET (10 MG TOTAL) BY MOUTH DAILY.   simvastatin 40 MG tablet Commonly known as:  ZOCOR TAKE ONE TABLET BY MOUTH ONE TIME DAILY   torsemide 100 MG tablet Commonly known as:  DEMADEX Take 100 mg by mouth daily.   vitamin C 500 MG tablet Commonly known as:  ASCORBIC ACID Take 500 mg by mouth daily. Reported on 09/05/2015   vitamin E 400 UNIT capsule Take 400 Units by mouth daily.      Follow-up Information    Bufford ButtnerUpton, Elizabeth, MD Follow up in 1 week(s).   Specialty:  Nephrology Why:  hospital follow up Contact information: [address] [place] [address] [phone]          Allergies  Allergen Reactions  . Okra Hives    Consultations:  None   Procedures/Studies: Dg Chest 2 View  Result Date: 10/31/2016 CLINICAL DATA:  Bilateral leg swelling with nausea EXAM: CHEST  2 VIEW COMPARISON:  09/09/2013 FINDINGS: Chronic cardiopericardial enlargement. Status post CABG. Chronic vascular pedicle widening. Pulmonary vascular congestion without Kerley lines or effusion. No air bronchogram or pneumothorax. IMPRESSION: Cardiomegaly and vascular congestion. Electronically Signed   By: Marnee SpringJonathon  Watts M.D.   On: 10/31/2016 13:04    Subjective:  She  relates she feels back to baseline. Will like to go home. Discharge Exam: Vitals:   11/03/16 2015 11/04/16 0436  BP: (!) 105/59 125/72  Pulse: 85 88  Resp: 19 19  Temp: 97.8 F (36.6 C) 97.7 F (36.5 C)   Vitals:   11/03/16 2015 11/03/16 2050 11/04/16 0435 11/04/16 0436  BP: (!) 105/59   125/72  Pulse: 85   88  Resp: 19   19  Temp: 97.8 F (36.6 C)   97.7 F (36.5 C)  TempSrc: Oral   Oral  SpO2: 91% 95%  97%  Weight:   110.9 kg (244 lb 9.6 oz)   Height:        General: In no acute distress. Cardiovascular: Regular rate and rhythm. Respiratory: Good air movement and clear to auscultation. Abdominal: Bowel sounds soft nontender nondistended. Extremities: No lower extremity edema.    The results of significant diagnostics from this hospitalization (including imaging, microbiology, ancillary and laboratory) are listed below for reference.     Microbiology: No results found for this or any previous visit (from the past 240 hour(s)).  Labs: BNP (last 3 results)  Recent Labs  10/31/16 1551  BNP 1,780.5*   Basic Metabolic Panel:  Recent Labs Lab 10/31/16 1551 11/01/16 0611 11/02/16 0442 11/03/16 0422 11/04/16 0457  NA 139 140 138 137 135  K 4.7 3.9 4.0 4.3 4.2  CL 107 106 106 103 102  CO2 21* 23 23 22 24   GLUCOSE 79 86 103* 111* 119*  BUN 96* 96* 99* 93* 102*  CREATININE 3.32* 3.25* 3.30* 3.25* 3.85*  CALCIUM 9.2 9.0 9.1 9.2 9.5  MG  --  2.3  --   --   --    Liver Function Tests:  Recent Labs Lab 10/31/16 1551  AST 40  ALT 18  ALKPHOS 42  BILITOT 1.1  PROT 7.8  ALBUMIN 4.0    Recent Labs Lab 10/31/16 1551  LIPASE 36   No results for input(s): AMMONIA in the last 168 hours. CBC:  Recent Labs Lab 10/31/16 1551 11/01/16 0940 11/04/16 0457  WBC 5.6  --  5.7  HGB 11.9*  --  11.5*  HCT 36.8 34.6 36.2  MCV 95.8  --  96.8  PLT 159  --  140*   Cardiac Enzymes:  Recent Labs Lab 10/31/16 1551 10/31/16 2238 11/01/16 0611  11/01/16 1257  TROPONINI 0.05* 0.05* 0.05* 0.05*   BNP: Invalid input(s): POCBNP CBG:  Recent Labs Lab 11/03/16 1133 11/03/16 1319 11/03/16 1733 11/03/16 2127 11/04/16 0735  GLUCAP 183* 166* 154* 129* 98   D-Dimer No results for input(s): DDIMER in the last 72 hours. Hgb A1c No results for input(s): HGBA1C in the last 72 hours. Lipid Profile No results for input(s): CHOL, HDL, LDLCALC, TRIG, CHOLHDL, LDLDIRECT in the last 72 hours. Thyroid function studies No results for input(s): TSH, T4TOTAL, T3FREE, THYROIDAB in the last 72 hours.  Invalid input(s): FREET3 Anemia work up  Recent Labs  11/01/16 0940  VITAMINB12 1,451*   Urinalysis    Component Value Date/Time   COLORURINE YELLOW 10/31/2016 1353   APPEARANCEUR CLEAR 10/31/2016 1353   LABSPEC 1.010 10/31/2016 1353   PHURINE 5.0 10/31/2016 1353   GLUCOSEU NEGATIVE 10/31/2016 1353   GLUCOSEU NEGATIVE 09/04/2013 1025   HGBUR NEGATIVE 10/31/2016 1353   BILIRUBINUR NEGATIVE 10/31/2016 1353   KETONESUR NEGATIVE 10/31/2016 1353   PROTEINUR NEGATIVE 10/31/2016 1353   UROBILINOGEN 0.2 09/04/2013 1025   NITRITE NEGATIVE 10/31/2016 1353   LEUKOCYTESUR NEGATIVE 10/31/2016 1353   Sepsis Labs Invalid input(s): PROCALCITONIN,  WBC,  LACTICIDVEN Microbiology No results found for this or any previous visit (from the past 240 hour(s)).   Time coordinating discharge: Over 30 minutes  SIGNED:   Marinda Elk, MD  [facility] 11/04/2016, 8:36 AM Pager   If 7PM-7AM, please contact night-coverage [email or web address] Password [email or web address]

## 2016-11-14 DIAGNOSIS — D649 Anemia, unspecified: Secondary | ICD-10-CM | POA: Diagnosis not present

## 2016-11-14 DIAGNOSIS — I2510 Atherosclerotic heart disease of native coronary artery without angina pectoris: Secondary | ICD-10-CM | POA: Diagnosis not present

## 2016-11-14 DIAGNOSIS — Z6841 Body Mass Index (BMI) 40.0 and over, adult: Secondary | ICD-10-CM | POA: Diagnosis not present

## 2016-11-14 DIAGNOSIS — I5022 Chronic systolic (congestive) heart failure: Secondary | ICD-10-CM | POA: Diagnosis not present

## 2016-11-14 DIAGNOSIS — N184 Chronic kidney disease, stage 4 (severe): Secondary | ICD-10-CM | POA: Diagnosis not present

## 2016-11-14 DIAGNOSIS — N2581 Secondary hyperparathyroidism of renal origin: Secondary | ICD-10-CM | POA: Diagnosis not present

## 2016-11-14 DIAGNOSIS — E1129 Type 2 diabetes mellitus with other diabetic kidney complication: Secondary | ICD-10-CM | POA: Diagnosis not present

## 2016-11-20 ENCOUNTER — Telehealth: Payer: Self-pay | Admitting: Endocrinology

## 2016-11-20 NOTE — Telephone Encounter (Signed)
Please go to urgent care.  I hope you feel better soon.

## 2016-11-20 NOTE — Telephone Encounter (Signed)
Patient called in reference to having "water blister skin problem". Patient stated she was in the hospital for 5 days and this issue was not addressed. Patient stated the "blisters" are all over her ankles, legs and back. Please call patient and advise. OK to leave message.

## 2016-11-21 NOTE — Telephone Encounter (Signed)
Called and left patient a VM to go to urgent care since Dr. Everardo AllEllison is on vacation. If she had questions she could call back.

## 2016-11-23 ENCOUNTER — Other Ambulatory Visit: Payer: Self-pay | Admitting: Endocrinology

## 2016-11-26 ENCOUNTER — Other Ambulatory Visit: Payer: Self-pay | Admitting: Endocrinology

## 2016-11-26 NOTE — Telephone Encounter (Signed)
Please refill x 1 Ov is due  

## 2016-11-27 ENCOUNTER — Telehealth: Payer: Self-pay | Admitting: Student

## 2016-11-27 NOTE — Telephone Encounter (Signed)
[facility]/16 with Randall AnBrittany Strader, PA.  Records put with Brittany's schedule for 12/10/16.  lp

## 2016-11-30 ENCOUNTER — Telehealth: Payer: Self-pay | Admitting: Endocrinology

## 2016-11-30 NOTE — Telephone Encounter (Signed)
Patient can't come in today. Are there any medications that I can advise her to take?

## 2016-11-30 NOTE — Telephone Encounter (Signed)
Called patient & told immodium may help, but if symptoms persist she needs to go to urgent care over the weekend.

## 2016-11-30 NOTE — Telephone Encounter (Signed)
OV 4 PM today

## 2016-11-30 NOTE — Telephone Encounter (Signed)
Please advise. Thank you

## 2016-11-30 NOTE — Telephone Encounter (Signed)
Patient's daughter [name] calling to clarify which medication she can give that won't contraindicate with her current medications?   She has been having really bad diarrhea or days now.  Ty,  -LL

## 2016-11-30 NOTE — Telephone Encounter (Signed)
immodium can help, but it is best to not take medication to slow it dow.  If you cannot come in today, go to urgent care when you can.

## 2016-12-03 ENCOUNTER — Emergency Department (HOSPITAL_COMMUNITY): Payer: Medicare Other

## 2016-12-03 ENCOUNTER — Encounter (HOSPITAL_COMMUNITY): Payer: Self-pay

## 2016-12-03 DIAGNOSIS — I517 Cardiomegaly: Secondary | ICD-10-CM | POA: Diagnosis not present

## 2016-12-03 DIAGNOSIS — N185 Chronic kidney disease, stage 5: Secondary | ICD-10-CM | POA: Diagnosis present

## 2016-12-03 DIAGNOSIS — I361 Nonrheumatic tricuspid (valve) insufficiency: Secondary | ICD-10-CM | POA: Diagnosis not present

## 2016-12-03 DIAGNOSIS — E1122 Type 2 diabetes mellitus with diabetic chronic kidney disease: Secondary | ICD-10-CM | POA: Diagnosis present

## 2016-12-03 DIAGNOSIS — Z91018 Allergy to other foods: Secondary | ICD-10-CM

## 2016-12-03 DIAGNOSIS — Z6841 Body Mass Index (BMI) 40.0 and over, adult: Secondary | ICD-10-CM | POA: Diagnosis not present

## 2016-12-03 DIAGNOSIS — I214 Non-ST elevation (NSTEMI) myocardial infarction: Principal | ICD-10-CM | POA: Diagnosis present

## 2016-12-03 DIAGNOSIS — J9811 Atelectasis: Secondary | ICD-10-CM | POA: Diagnosis not present

## 2016-12-03 DIAGNOSIS — I2510 Atherosclerotic heart disease of native coronary artery without angina pectoris: Secondary | ICD-10-CM | POA: Diagnosis present

## 2016-12-03 DIAGNOSIS — Z7982 Long term (current) use of aspirin: Secondary | ICD-10-CM | POA: Diagnosis not present

## 2016-12-03 DIAGNOSIS — E874 Mixed disorder of acid-base balance: Secondary | ICD-10-CM | POA: Diagnosis present

## 2016-12-03 DIAGNOSIS — R531 Weakness: Secondary | ICD-10-CM | POA: Diagnosis not present

## 2016-12-03 DIAGNOSIS — R570 Cardiogenic shock: Secondary | ICD-10-CM | POA: Diagnosis not present

## 2016-12-03 DIAGNOSIS — Z515 Encounter for palliative care: Secondary | ICD-10-CM | POA: Diagnosis not present

## 2016-12-03 DIAGNOSIS — N179 Acute kidney failure, unspecified: Secondary | ICD-10-CM

## 2016-12-03 DIAGNOSIS — R579 Shock, unspecified: Secondary | ICD-10-CM | POA: Diagnosis not present

## 2016-12-03 DIAGNOSIS — J9601 Acute respiratory failure with hypoxia: Secondary | ICD-10-CM | POA: Diagnosis not present

## 2016-12-03 DIAGNOSIS — Z9289 Personal history of other medical treatment: Secondary | ICD-10-CM

## 2016-12-03 DIAGNOSIS — Z888 Allergy status to other drugs, medicaments and biological substances status: Secondary | ICD-10-CM

## 2016-12-03 DIAGNOSIS — I442 Atrioventricular block, complete: Secondary | ICD-10-CM | POA: Diagnosis not present

## 2016-12-03 DIAGNOSIS — G4733 Obstructive sleep apnea (adult) (pediatric): Secondary | ICD-10-CM | POA: Diagnosis present

## 2016-12-03 DIAGNOSIS — J810 Acute pulmonary edema: Secondary | ICD-10-CM | POA: Diagnosis not present

## 2016-12-03 DIAGNOSIS — E875 Hyperkalemia: Secondary | ICD-10-CM | POA: Diagnosis present

## 2016-12-03 DIAGNOSIS — S299XXA Unspecified injury of thorax, initial encounter: Secondary | ICD-10-CM | POA: Diagnosis not present

## 2016-12-03 DIAGNOSIS — I255 Ischemic cardiomyopathy: Secondary | ICD-10-CM | POA: Diagnosis present

## 2016-12-03 DIAGNOSIS — D631 Anemia in chronic kidney disease: Secondary | ICD-10-CM | POA: Diagnosis present

## 2016-12-03 DIAGNOSIS — S0990XA Unspecified injury of head, initial encounter: Secondary | ICD-10-CM | POA: Diagnosis not present

## 2016-12-03 DIAGNOSIS — Z452 Encounter for adjustment and management of vascular access device: Secondary | ICD-10-CM

## 2016-12-03 DIAGNOSIS — Z794 Long term (current) use of insulin: Secondary | ICD-10-CM | POA: Diagnosis not present

## 2016-12-03 DIAGNOSIS — I132 Hypertensive heart and chronic kidney disease with heart failure and with stage 5 chronic kidney disease, or end stage renal disease: Secondary | ICD-10-CM | POA: Diagnosis present

## 2016-12-03 DIAGNOSIS — I5020 Unspecified systolic (congestive) heart failure: Secondary | ICD-10-CM | POA: Diagnosis present

## 2016-12-03 DIAGNOSIS — Z9911 Dependence on respirator [ventilator] status: Secondary | ICD-10-CM

## 2016-12-03 DIAGNOSIS — Z8673 Personal history of transient ischemic attack (TIA), and cerebral infarction without residual deficits: Secondary | ICD-10-CM

## 2016-12-03 DIAGNOSIS — E785 Hyperlipidemia, unspecified: Secondary | ICD-10-CM | POA: Diagnosis present

## 2016-12-03 DIAGNOSIS — Z4682 Encounter for fitting and adjustment of non-vascular catheter: Secondary | ICD-10-CM | POA: Diagnosis not present

## 2016-12-03 DIAGNOSIS — R34 Anuria and oliguria: Secondary | ICD-10-CM | POA: Diagnosis present

## 2016-12-03 DIAGNOSIS — R404 Transient alteration of awareness: Secondary | ICD-10-CM | POA: Diagnosis not present

## 2016-12-03 DIAGNOSIS — G9341 Metabolic encephalopathy: Secondary | ICD-10-CM | POA: Diagnosis not present

## 2016-12-03 DIAGNOSIS — B372 Candidiasis of skin and nail: Secondary | ICD-10-CM | POA: Diagnosis present

## 2016-12-03 DIAGNOSIS — E871 Hypo-osmolality and hyponatremia: Secondary | ICD-10-CM | POA: Diagnosis present

## 2016-12-03 DIAGNOSIS — G9340 Encephalopathy, unspecified: Secondary | ICD-10-CM | POA: Diagnosis not present

## 2016-12-03 DIAGNOSIS — Z66 Do not resuscitate: Secondary | ICD-10-CM | POA: Diagnosis present

## 2016-12-03 DIAGNOSIS — E872 Acidosis: Secondary | ICD-10-CM | POA: Diagnosis not present

## 2016-12-03 DIAGNOSIS — J9690 Respiratory failure, unspecified, unspecified whether with hypoxia or hypercapnia: Secondary | ICD-10-CM

## 2016-12-03 HISTORY — DX: Cerebral infarction, unspecified: I63.9

## 2016-12-03 HISTORY — DX: Disorder of kidney and ureter, unspecified: N28.9

## 2016-12-03 LAB — URINALYSIS, ROUTINE W REFLEX MICROSCOPIC
BILIRUBIN URINE: NEGATIVE
Glucose, UA: NEGATIVE mg/dL
HGB URINE DIPSTICK: NEGATIVE
KETONES UR: NEGATIVE mg/dL
LEUKOCYTES UA: NEGATIVE
Nitrite: NEGATIVE
PROTEIN: 100 mg/dL — AB
Specific Gravity, Urine: 1.023 (ref 1.005–1.030)
pH: 5 (ref 5.0–8.0)

## 2016-12-03 LAB — I-STAT ARTERIAL BLOOD GAS, ED
Acid-base deficit: 17 mmol/L — ABNORMAL HIGH (ref 0.0–2.0)
Bicarbonate: 10.8 mmol/L — ABNORMAL LOW (ref 20.0–28.0)
O2 Saturation: 95 %
PCO2 ART: 30.9 mmHg — AB (ref 32.0–48.0)
PH ART: 7.149 — AB (ref 7.350–7.450)
TCO2: 12 mmol/L (ref 0–100)
pO2, Arterial: 95 mmHg (ref 83.0–108.0)

## 2016-12-03 LAB — I-STAT CHEM 8, ED
CALCIUM ION: 1.13 mmol/L — AB (ref 1.15–1.40)
CREATININE: 10.4 mg/dL — AB (ref 0.44–1.00)
Chloride: 100 mmol/L — ABNORMAL LOW (ref 101–111)
GLUCOSE: 66 mg/dL (ref 65–99)
HCT: 39 % (ref 36.0–46.0)
Hemoglobin: 13.3 g/dL (ref 12.0–15.0)
Potassium: 5.4 mmol/L — ABNORMAL HIGH (ref 3.5–5.1)
Sodium: 130 mmol/L — ABNORMAL LOW (ref 135–145)
TCO2: 14 mmol/L (ref 0–100)

## 2016-12-03 LAB — COMPREHENSIVE METABOLIC PANEL
ALBUMIN: 3.5 g/dL (ref 3.5–5.0)
ALT: 186 U/L — ABNORMAL HIGH (ref 14–54)
AST: 328 U/L — AB (ref 15–41)
Alkaline Phosphatase: 45 U/L (ref 38–126)
Anion gap: 21 — ABNORMAL HIGH (ref 5–15)
BUN: 165 mg/dL — AB (ref 6–20)
CHLORIDE: 98 mmol/L — AB (ref 101–111)
CO2: 13 mmol/L — ABNORMAL LOW (ref 22–32)
Calcium: 10 mg/dL (ref 8.9–10.3)
Creatinine, Ser: 10.3 mg/dL — ABNORMAL HIGH (ref 0.44–1.00)
GFR calc Af Amer: 4 mL/min — ABNORMAL LOW (ref >60)
GFR, EST NON AFRICAN AMERICAN: 3 mL/min — AB (ref >60)
GLUCOSE: 74 mg/dL (ref 65–99)
POTASSIUM: 5.6 mmol/L — AB (ref 3.5–5.1)
SODIUM: 132 mmol/L — AB (ref 135–145)
Total Bilirubin: 1.5 mg/dL — ABNORMAL HIGH (ref 0.3–1.2)
Total Protein: 6.8 g/dL (ref 6.5–8.1)

## 2016-12-03 LAB — CBC WITH DIFFERENTIAL/PLATELET
Basophils Absolute: 0 10*3/uL (ref 0.0–0.1)
Basophils Relative: 0 %
EOS PCT: 0 %
Eosinophils Absolute: 0 10*3/uL (ref 0.0–0.7)
HEMATOCRIT: 35.1 % — AB (ref 36.0–46.0)
Hemoglobin: 11.5 g/dL — ABNORMAL LOW (ref 12.0–15.0)
LYMPHS ABS: 0.6 10*3/uL — AB (ref 0.7–4.0)
Lymphocytes Relative: 6 %
MCH: 30.2 pg (ref 26.0–34.0)
MCHC: 32.8 g/dL (ref 30.0–36.0)
MCV: 92.1 fL (ref 78.0–100.0)
MONOS PCT: 5 %
Monocytes Absolute: 0.5 10*3/uL (ref 0.1–1.0)
Neutro Abs: 8.2 10*3/uL — ABNORMAL HIGH (ref 1.7–7.7)
Neutrophils Relative %: 89 %
Platelets: 131 10*3/uL — ABNORMAL LOW (ref 150–400)
RBC: 3.81 MIL/uL — AB (ref 3.87–5.11)
RDW: 16.5 % — AB (ref 11.5–15.5)
WBC: 9.3 10*3/uL (ref 4.0–10.5)

## 2016-12-03 LAB — CBG MONITORING, ED
GLUCOSE-CAPILLARY: 69 mg/dL (ref 65–99)
Glucose-Capillary: 126 mg/dL — ABNORMAL HIGH (ref 65–99)
Glucose-Capillary: 171 mg/dL — ABNORMAL HIGH (ref 65–99)

## 2016-12-03 LAB — I-STAT TROPONIN, ED: TROPONIN I, POC: 2.36 ng/mL — AB (ref 0.00–0.08)

## 2016-12-03 LAB — CK: Total CK: 705 U/L — ABNORMAL HIGH (ref 38–234)

## 2016-12-03 LAB — PROTIME-INR
INR: 2.2
Prothrombin Time: 24.8 seconds — ABNORMAL HIGH (ref 11.4–15.2)

## 2016-12-03 LAB — BRAIN NATRIURETIC PEPTIDE: B Natriuretic Peptide: 2882.1 pg/mL — ABNORMAL HIGH (ref 0.0–100.0)

## 2016-12-03 LAB — I-STAT CG4 LACTIC ACID, ED: LACTIC ACID, VENOUS: 4.54 mmol/L — AB (ref 0.5–1.9)

## 2016-12-03 LAB — APTT: aPTT: 38 seconds — ABNORMAL HIGH (ref 24–36)

## 2016-12-03 MED ORDER — DEXTROSE 50 % IV SOLN
1.0000 | Freq: Once | INTRAVENOUS | Status: AC
  Administered 2016-12-03: 50 mL via INTRAVENOUS
  Filled 2016-12-03: qty 50

## 2016-12-03 MED ORDER — SODIUM CHLORIDE 0.9 % IV SOLN: 250.0000 mL | INTRAVENOUS | Status: DC | PRN

## 2016-12-03 MED ORDER — SODIUM CHLORIDE 0.9 % IV SOLN
1.0000 g | Freq: Once | INTRAVENOUS | Status: AC
  Administered 2016-12-03: 1 g via INTRAVENOUS
  Filled 2016-12-03: qty 10

## 2016-12-03 MED ORDER — SODIUM CHLORIDE 0.9 % IV SOLN
INTRAVENOUS | Status: DC
  Administered 2016-12-03: 23:00:00 via INTRAVENOUS
  Administered 2016-12-04: 50 mL/h via INTRAVENOUS
  Administered 2016-12-06: 08:00:00 via INTRAVENOUS

## 2016-12-03 MED ORDER — SODIUM BICARBONATE 8.4 % IV SOLN
INTRAVENOUS | Status: AC | PRN
  Administered 2016-12-03: 50 meq via INTRAVENOUS

## 2016-12-03 MED ORDER — SODIUM CHLORIDE 0.9 % IV BOLUS (SEPSIS)
1000.0000 mL | Freq: Once | INTRAVENOUS | Status: AC
  Administered 2016-12-03: 1000 mL via INTRAVENOUS

## 2016-12-03 MED ORDER — INSULIN ASPART 100 UNIT/ML ~~LOC~~ SOLN
10.0000 [IU] | Freq: Once | SUBCUTANEOUS | Status: AC
  Administered 2016-12-03: 10 [IU] via INTRAVENOUS
  Filled 2016-12-03: qty 1

## 2016-12-03 MED ORDER — EPINEPHRINE PF 1 MG/10ML IJ SOSY
PREFILLED_SYRINGE | INTRAMUSCULAR | Status: AC | PRN
  Administered 2016-12-03: 1 via INTRAVENOUS

## 2016-12-03 MED ORDER — PANTOPRAZOLE SODIUM 40 MG IV SOLR
40.0000 mg | Freq: Every day | INTRAVENOUS | Status: DC
  Administered 2016-12-04 – 2016-12-07 (×4): 40 mg via INTRAVENOUS
  Filled 2016-12-03 (×4): qty 40

## 2016-12-03 MED ORDER — ETOMIDATE 2 MG/ML IV SOLN
INTRAVENOUS | Status: AC | PRN
  Administered 2016-12-03: 10 mg via INTRAVENOUS

## 2016-12-03 MED ORDER — SODIUM CHLORIDE 0.9 % IV BOLUS (SEPSIS)
250.0000 mL | Freq: Once | INTRAVENOUS | Status: AC
  Administered 2016-12-03: 250 mL via INTRAVENOUS

## 2016-12-03 MED ORDER — SIMVASTATIN 40 MG PO TABS: 40.0000 mg | ORAL_TABLET | Freq: Every day | ORAL | Status: DC

## 2016-12-03 MED ORDER — NOREPINEPHRINE BITARTRATE 1 MG/ML IV SOLN
0.0000 ug/min | Freq: Once | INTRAVENOUS | Status: AC
  Administered 2016-12-03: 2 ug/min via INTRAVENOUS
  Filled 2016-12-03 (×3): qty 4

## 2016-12-03 MED ORDER — ALBUTEROL (5 MG/ML) CONTINUOUS INHALATION SOLN
10.0000 mg/h | INHALATION_SOLUTION | RESPIRATORY_TRACT | Status: DC
  Administered 2016-12-03: 10 mg/h via RESPIRATORY_TRACT

## 2016-12-03 MED ORDER — ROCURONIUM BROMIDE 50 MG/5ML IV SOLN
INTRAVENOUS | Status: AC | PRN
  Administered 2016-12-03: 100 mg via INTRAVENOUS

## 2016-12-03 MED ORDER — EPINEPHRINE PF 1 MG/ML IJ SOLN
0.5000 ug/min | INTRAVENOUS | Status: DC
  Administered 2016-12-03: 5 ug/min via INTRAVENOUS
  Administered 2016-12-04: 10 ug/min via INTRAVENOUS
  Administered 2016-12-04: 13 ug/min via INTRAVENOUS
  Administered 2016-12-04: 16 ug/min via INTRAVENOUS
  Administered 2016-12-04: 10 ug/min via INTRAVENOUS
  Administered 2016-12-05: 8 ug/min via INTRAVENOUS
  Administered 2016-12-05: 10 ug/min via INTRAVENOUS
  Filled 2016-12-03 (×6): qty 4

## 2016-12-03 MED ORDER — ASPIRIN 81 MG PO CHEW
81.0000 mg | CHEWABLE_TABLET | Freq: Every day | ORAL | Status: DC
  Administered 2016-12-04 – 2016-12-07 (×4): 81 mg
  Filled 2016-12-03 (×4): qty 1

## 2016-12-03 MED ORDER — INSULIN ASPART 100 UNIT/ML ~~LOC~~ SOLN
0.0000 [IU] | SUBCUTANEOUS | Status: DC
  Administered 2016-12-04: 7 [IU] via SUBCUTANEOUS
  Administered 2016-12-04: 3 [IU] via SUBCUTANEOUS
  Administered 2016-12-04 – 2016-12-05 (×4): 4 [IU] via SUBCUTANEOUS
  Administered 2016-12-05 – 2016-12-07 (×8): 3 [IU] via SUBCUTANEOUS

## 2016-12-03 MED ORDER — ALBUTEROL SULFATE (2.5 MG/3ML) 0.083% IN NEBU
10.0000 mg | INHALATION_SOLUTION | Freq: Once | RESPIRATORY_TRACT | Status: DC
  Filled 2016-12-03: qty 12

## 2016-12-03 MED ORDER — HEPARIN SODIUM (PORCINE) 5000 UNIT/ML IJ SOLN
5000.0000 [IU] | Freq: Three times a day (TID) | INTRAMUSCULAR | Status: DC
  Administered 2016-12-04 – 2016-12-07 (×9): 5000 [IU] via SUBCUTANEOUS
  Filled 2016-12-03 (×10): qty 1

## 2016-12-03 NOTE — ED Notes (Signed)
Pt's spo2 is going up and down between 77% and 85%,  Critical care notified and RT. RT in room to advance ETT 3cm. Now 23cm at the lip

## 2016-12-03 NOTE — ED Notes (Signed)
Portable chest x ray completed.

## 2016-12-03 NOTE — ED Notes (Signed)
Epinephrine started at 5

## 2016-12-03 NOTE — ED Notes (Signed)
RT in room to place arterial line. Pt's daughter at bedside. Pt has strong pulse. BP still low. Spo2 still staying in the 80% on the vent.

## 2016-12-03 NOTE — ED Notes (Signed)
Paged neurosurgery/JENKINS

## 2016-12-03 NOTE — Progress Notes (Signed)
Arterial gas was drawn on 2.5 lpm cannula, results given to Dr.Miller.

## 2016-12-03 NOTE — Consult Note (Signed)
[facility] Renal Consultation Note  Requesting MD:  Indication for Consultation:   HPI:  [name] is a 66 y.o. female with CAD and ischemic cardiomyopathy, last known EF 15-20%, DM, s/p CVA with residual balance and memory issues, also CKD that has been variable- followed by Dr. Signe Colt at [facility], last seen 8/1 with creatinine 3.9 and BUN 111- lasix was decreased slightly in response.  Daughter reports hosp at end of July, initially did well after discharge but the last week has been "rough" no appetite, SOB, very weak, falling.  Brought to ER today found to be bradycardic and hypotensive- labs show creatinine of 10.4- BUN greater than 140- K of 5.4- Co2 14- ABG with pH 7.149, pco2 30.  She is currently being intubated, BP has been low for around 5 minutes, putting in central line for pressors.  Daughter reports that pt is DNR but would want support if anything would be reversible.  Dr. Signe Colt had stated that she likely would not be a good candidate for dialysis due to decreased EF, plan was for her to go to cardiology to see if cardiac function could be maximized.    Creatinine, Ser  Date/Time Value Ref Range Status  11/29/2016 07:17 PM 10.40 (H) 0.44 - 1.00 mg/dL Final     PMHx:   Past Medical History:  Diagnosis Date  . CHF (congestive heart failure) (HCC)   . Renal disorder     History reviewed. No pertinent surgical history.  Family Hx: History reviewed. No pertinent family history.  Social History:  reports that she does not drink alcohol or use drugs. Her tobacco history is not on file.  Allergies:  Allergies  Allergen Reactions  . [place]ra Hives and Rash  . Torsemide Rash    Medications: Prior to Admission medications   Medication Sig Start Date End Date Taking? Authorizing Provider  aspirin EC 81 MG tablet Take 81 mg by mouth daily.   Yes [provider]  carvedilol (COREG) 3.125 MG tablet Take 3.125 mg by mouth 2 (two) times daily with a  meal.   Yes [provider]  enalapril (VASOTEC) 10 MG tablet Take 10 mg by mouth 2 (two) times daily.   Yes [provider]  furosemide (LASIX) 20 MG tablet Take 20 mg by mouth daily. Take with 80mg  to equal 100mg  total.   Yes [provider]  furosemide (LASIX) 80 MG tablet Take 80 mg by mouth daily. Taken with 20mg  to equal 100mg  total.   Yes [provider]  insulin aspart protamine- aspart (NOVOLOG MIX 70/30) (70-30) 100 UNIT/ML injection Inject 12 Units into the skin daily with breakfast.   Yes [provider]  Multiple Vitamins-Minerals (MULTIVITAMIN WITH MINERALS) tablet Take 1 tablet by mouth daily.   Yes [provider]  nitroGLYCERIN (NITROSTAT) 0.4 MG SL tablet Place 0.4 mg under the tongue every 5 (five) minutes as needed for chest pain.   Yes [provider]  oxybutynin (DITROPAN-XL) 10 MG 24 hr tablet Take 10 mg by mouth daily.   Yes [provider]  simvastatin (ZOCOR) 40 MG tablet Take 40 mg by mouth daily.   Yes [provider]  vitamin C (ASCORBIC ACID) 500 MG tablet Take 500 mg by mouth daily. gummies   Yes [provider]  vitamin E 400 UNIT capsule Take 400 Units by mouth daily.   Yes [provider]    I have reviewed the patient's current medications.  Labs:  Results  for orders placed or performed during the hospital encounter of December 14, 2016 (from the past 48 hour(s))  CBC with Differential     Status: Abnormal   Collection Time: Dec 14, 2016  6:58 PM  Result Value Ref Range   WBC 9.3 4.0 - 10.5 K/uL   RBC 3.81 (L) 3.87 - 5.11 MIL/uL   Hemoglobin 11.5 (L) 12.0 - 15.0 g/dL   HCT 16.1 (L) 09.6 - 04.5 %   MCV 92.1 78.0 - 100.0 fL   MCH 30.2 26.0 - 34.0 pg   MCHC 32.8 30.0 - 36.0 g/dL   RDW 40.9 (H) 81.1 - 91.4 %   Platelets 131 (L) 150 - 400 K/uL   Neutrophils Relative % 89 %   Lymphocytes Relative 6 %   Monocytes Relative 5 %   Eosinophils Relative 0 %   Basophils Relative  0 %   Neutro Abs 8.2 (H) 1.7 - 7.7 K/uL   Lymphs Abs 0.6 (L) 0.7 - 4.0 K/uL   Monocytes Absolute 0.5 0.1 - 1.0 K/uL   Eosinophils Absolute 0.0 0.0 - 0.7 K/uL   Basophils Absolute 0.0 0.0 - 0.1 K/uL   RBC Morphology BURR CELLS     Comment: ACANTHOCYTES  Protime-INR     Status: Abnormal   Collection Time: Dec 14, 2016  6:58 PM  Result Value Ref Range   Prothrombin Time 24.8 (H) 11.4 - 15.2 seconds   INR 2.20   APTT     Status: Abnormal   Collection Time: 14-Dec-2016  6:58 PM  Result Value Ref Range   aPTT 38 (H) 24 - 36 seconds    Comment:        IF BASELINE aPTT IS ELEVATED, SUGGEST PATIENT RISK ASSESSMENT BE USED TO DETERMINE APPROPRIATE ANTICOAGULANT THERAPY.   Brain natriuretic peptide     Status: Abnormal   Collection Time: 12/14/2016  6:58 PM  Result Value Ref Range   B Natriuretic Peptide 2,882.1 (H) 0.0 - 100.0 pg/mL  I-Stat Troponin, ED (not at [facility])     Status: Abnormal   Collection Time: Dec 14, 2016  7:15 PM  Result Value Ref Range   Troponin i, poc 2.36 (HH) 0.00 - 0.08 ng/mL   Comment NOTIFIED PHYSICIAN    Comment 3            Comment: Due to the release kinetics of cTnI, a negative result within the first hours of the onset of symptoms does not rule out myocardial infarction with certainty. If myocardial infarction is still suspected, repeat the test at appropriate intervals.   I-Stat CG4 Lactic Acid, ED     Status: Abnormal   Collection Time: 12-14-16  7:17 PM  Result Value Ref Range   Lactic Acid, Venous 4.54 (HH) 0.5 - 1.9 mmol/L  I-Stat Chem 8, ED     Status: Abnormal   Collection Time: 14-Dec-2016  7:17 PM  Result Value Ref Range   Sodium 130 (L) 135 - 145 mmol/L   Potassium 5.4 (H) 3.5 - 5.1 mmol/L   Chloride 100 (L) 101 - 111 mmol/L   BUN >140 (H) 6 - 20 mg/dL   Creatinine, Ser 78.29 (H) 0.44 - 1.00 mg/dL   Glucose, Bld 66 65 - 99 mg/dL   Calcium, Ion 5.62 (L) 1.15 - 1.40 mmol/L   TCO2 14 0 - 100 mmol/L   Hemoglobin 13.3 12.0 - 15.0 g/dL   HCT 13.0 86.5 -  78.4 %  CBG monitoring, ED     Status: None   Collection Time: 2016/12/14  7:25 PM  Result Value Ref Range   Glucose-Capillary 69 65 - 99 mg/dL  I-Stat Arterial Blood Gas, ED - (order at [facility] and [facility] only)     Status: Abnormal   Collection Time: 12/06/2016  8:06 PM  Result Value Ref Range   pH, Arterial 7.149 (LL) 7.350 - 7.450   pCO2 arterial 30.9 (L) 32.0 - 48.0 mmHg   pO2, Arterial 95.0 83.0 - 108.0 mmHg   Bicarbonate 10.8 (L) 20.0 - 28.0 mmol/L   TCO2 12 0 - 100 mmol/L   O2 Saturation 95.0 %   Acid-base deficit 17.0 (H) 0.0 - 2.0 mmol/L   Patient temperature 98.0 F    Collection site RADIAL, ALLEN'S TEST ACCEPTABLE    Drawn by RT    Sample type ARTERIAL    Comment NOTIFIED PHYSICIAN      ROS:  Review of systems not obtained due to patient factors.  Physical Exam: Vitals:   12/12/2016 2030 11/19/2016 2035  BP: (!) 38/30 93/69  Pulse: (!) 109 (!) 126  Resp: (!) 34 (!) 21  Temp:    SpO2: (!) 49% (!) 80%     General: appears older than age- just intubated and central line being placed  HEENT: PERRLA, mucous membranes moist Neck: positive for JVD Heart: previous brady, now better  Lungs: poor effort, CBS Abdomen: obese, slightly distended Extremities: pitting edema Skin: warm and dry Neuro: just received etomidate  Assessment/Plan: 66 year old WF with DM, HTN, ischemic cardiomyopathy and progressive CKD s/p CVA- now presents with worsening renal function likely leading to clinical decline- has significant uremia and hyperkalemia with hemodynamic instability 1.Renal- progressive CKD multifactorial- now uremic and hyperkalemic.  Considered by Dr. Signe Colt to possibly not be a good dialysis candidate.  Unfortunately if we do not do dialysis in the short term she will die tonight so daughter understood and did agree to at least short term aggressive support to see how she does.  If she does poorly in the short term I think daughter would be amenable to move toward comfort care.  Will  plan to initiate CRRT in ICU tonight- have spoken with CCM to assist 2. Hypertension/volume  - volume overloaded. Known very poor EF 15-20%.  Aggressive care in the short term with pressor support, possible inotropes if indicated and see what direction it goes 3. Hyperkalemia- K only reading as 5.4 but appears more hyperkalemic than that , or maybe just a very sick heart.  Is also acidotic- CRRT should help stabilize in the short term 4. Anemia  - not a huge issue at present- will follow  5. Positive troponin/ arrythmia- possible new cardiac event- complicating factor- supportive care for now- too unstable for invasive management    Shyloh Krinke A 11/20/2016, 8:41 PM

## 2016-12-03 NOTE — ED Provider Notes (Signed)
Physical Exam  BP (!) 95/44   Pulse 94   Temp (!) 95.9 F (35.5 C) (Temporal)   Resp 11   Ht 5\' 2"  (1.575 m)   Wt 111.6 kg (246 lb)   SpO2 (!) 76%   BMI 44.99 kg/m   Physical Exam  HPI:  The patient is a 66 year old female, she has a known history of congestive heart failure with an ejection fraction of 30%, known history of diabetes and worsening renal failure which has required nephrology consultation after she was admitted in July with anasarca, diuresed and found to have worsening kidney function. Baseline creatinine was found to be at approximately 3.2 at that time. The patient has been home for approximately one month, we do not have much in the way of information as the patient arrives by ambulance transport after being found on the ground face down by a family member who she lives with who came home from work today to find her in that position. The patient does recall falling, we do not have much more information. She was found altered, confused, hypotensive, bradycardic and in a wide-complex bradycardia. She was given doses of both bicarbonate as well as calcium prehospital but this had no change on her EKG. She remained hypotensive and bradycardic. The patient endorses shortness of breath and swelling, she is unable to give much more information, level V caveat applies secondary to altered mental status and the critical nature of her situation.   Physical Exam:  On exam the patient is edematous diffusely, she has increased work of breathing, she is hypoxic to 90%, she has wheezing and rales, she has a soft abdomen and underneath the skin folds of her pannus especially on the left there is significant skin breakdown and what appears to be a yeast infection. She has bilateral lower extremity edema, she is bradycardic and has weak peripheral pulses, she is awake but has difficulty communicating with significant slurred speech.  The patient is critically ill with likely worsening renal  function, dehydration, trauma, generalized weakness. She will need a broad workup including CT scan of the brain, chest x-ray, EKG, labs and medical resuscitation.  The patient has multiple abnormal labs including an significantly elevated troponin at 2.36, and acute kidney failure at creatinine of 10.4 and a BUN of over 140. The lactic acid is 4.5. Cardiology and nephrology have both been paged for evaluation.  The patient's blood pressure is persistently hypotensive, she is getting IV fluids, she has IV access, her mental status is maintaining and she is able answer questions but is severely weak. Her EKG has been performed and shows wide-complex QRS, appears to have some component of heart block with dropped QRS, possibly atrial fibrillation  Levophed added for hypotension  Nephrology and Cards paged Cardiology - paged. Dr. Ronelle Nigh recommends CCM admit - Echo.  Trop of 2.4 without STEMI suggestive of stress or renal pseudoischemia CCM paged  During the course of the resuscitation the patient became apneic and pale, she went into a cardiac arrest, sinus bradycardia was present on the monitor but she had no pulses requiring CPR. I directed CPR. I performed CPR. I then placed a central line in the right internal jugular vein with breathing regained pulses and intubated the patient.  D/w Dr. Dora Sims who will assist and consult from Nephrology Critical care consulted to admit.  ED Course  .Central Line Date/Time: 02-Jan-2017 9:04 PM Performed by: Eber Hong Authorized by: Eber Hong   Consent:    Consent  obtained:  Emergent situation Pre-procedure details:    Hand hygiene: Hand hygiene performed prior to insertion     Sterile barrier technique: All elements of maximal sterile technique followed     Skin preparation:  ChloraPrep   Skin preparation agent: Skin preparation agent completely dried prior to procedure   Anesthesia (see MAR for exact dosages):    Anesthesia method:   None Procedure details:    Location:  R internal jugular   Patient position:  Reverse Trendelenburg   Procedural supplies:  Triple lumen   Catheter size:  7 Fr   Landmarks identified: yes     Ultrasound guidance: yes     Sterile ultrasound techniques: Sterile gel and sterile probe covers were used     Number of attempts:  2   Successful placement: yes   Post-procedure details:    Post-procedure:  Dressing applied and line sutured   Assessment:  Blood return through all ports, no pneumothorax on x-ray, free fluid flow and placement verified by x-ray   Patient tolerance of procedure:  Tolerated well, no immediate complications    Cardiopulmonary Resuscitation (CPR) Procedure Note Directed/Performed by: Vida Roller I personally directed ancillary staff and/or performed CPR in an effort to regain return of spontaneous circulation and to maintain cardiac, neuro and systemic perfusion.   CRITICAL CARE Performed by: Vida Roller Total critical care time: 35 minutes Critical care time was exclusive of separately billable procedures and treating other patients. Critical care was necessary to treat or prevent imminent or life-threatening deterioration. Critical care was time spent personally by me on the following activities: development of treatment plan with patient and/or surrogate as well as nursing, discussions with consultants, evaluation of patient's response to treatment, examination of patient, obtaining history from patient or surrogate, ordering and performing treatments and interventions, ordering and review of laboratory studies, ordering and review of radiographic studies, pulse oximetry and re-evaluation of patient's condition.    I saw and evaluated the patient, reviewed the resident's note and I agree with the findings and plan.    I was personally present and directly supervised the following procedures:  Intubation     EKG Interpretation  Date/Time:  Monday  December 03 2016 19:27:21 EDT Ventricular Rate:  57 PR Interval:    QRS Duration: 223 QT Interval:  554 QTC Calculation: 540 R Axis:   5 Text Interpretation:  Atrial fibrillation Left bundle branch block Baseline wander in lead(s) V2 No old tracing to compare Confirmed by Eber Hong ([id number]) on 12/12/2016 7:55:04 PM        I personally interpreted the EKG as well as the resident and agree with the interpretation on the resident's chart.  Final diagnoses:  NSTEMI (non-ST elevated myocardial infarction) (HCC)  Acute renal failure, unspecified acute renal failure type [facility])  Cardiogenic shock (HCC)        Eber Hong, MD 12/14/2016 2358

## 2016-12-03 NOTE — Code Documentation (Signed)
Caryn Bee RN accompanied pt to CT- alert responsive initally. At CT patient became unresponsive, had pulse, agonal breathing- returned to ER- Pt lost pulses at 2027- CPR and bagging patient with BVM initiated.

## 2016-12-03 NOTE — ED Provider Notes (Signed)
[facility]-EMERGENCY DEPT Provider Note   CSN: [id number] Arrival date & time: 12/04/16  3810     History   Chief Complaint Chief Complaint  Patient presents with  . Fall  . Bradycardia    HPI [name] is a 66 y.o. female.  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 Patient lives with daughter, who found her when she got home from work. Patient discharged several weeks ago per daughter for "heart and fluid issues". Has had some issues with frequent falls and weakness since discharge. Patient found by EMS bradycardic and hypotensive. Patient unable to provide additional history, moaning during examination.  Past Medical History:  Diagnosis Date  . CHF (congestive heart failure) (HCC)   . Renal disorder     Patient Active Problem List   Diagnosis Date Noted  . Hyperkalemia 2016/12/04    History reviewed. No pertinent surgical history.  OB History    No data available       Home Medications    Prior to Admission medications   Medication Sig Start Date End Date Taking? Authorizing Provider  aspirin EC 81 MG tablet Take 81 mg by mouth daily.   Yes [provider]  carvedilol (COREG) 3.125 MG tablet Take 3.125 mg by mouth 2 (two) times daily with a meal.   Yes [provider]  enalapril (VASOTEC) 10 MG tablet Take 10 mg by mouth 2 (two) times daily.   Yes [provider]  furosemide (LASIX) 20 MG tablet Take 20 mg by mouth daily. Take with 80mg  to equal 100mg  total.   Yes [provider]  furosemide (LASIX) 80 MG tablet Take 80 mg by mouth daily. Taken with 20mg  to equal 100mg  total.   Yes [provider]  insulin aspart protamine- aspart (NOVOLOG MIX 70/30) (70-30) 100 UNIT/ML injection Inject 12 Units into the skin daily with breakfast.   Yes [provider]  Multiple  Vitamins-Minerals (MULTIVITAMIN WITH MINERALS) tablet Take 1 tablet by mouth daily.   Yes [provider]  nitroGLYCERIN (NITROSTAT) 0.4 MG SL tablet Place 0.4 mg under the tongue every 5 (five) minutes as needed for chest pain.   Yes [provider]  oxybutynin (DITROPAN-XL) 10 MG 24 hr tablet Take 10 mg by mouth daily.   Yes [provider]  simvastatin (ZOCOR) 40 MG tablet Take 40 mg by mouth daily.   Yes [provider]  vitamin C (ASCORBIC ACID) 500 MG tablet Take 500 mg by mouth daily. gummies   Yes [provider]  vitamin E 400 UNIT capsule Take 400 Units by mouth daily.   Yes [provider]    Family History History reviewed. No pertinent family history.  Social History Social History  Substance Use Topics  . Smoking status: Unknown If Ever Smoked  . Smokeless tobacco: Not on file  . Alcohol use No     Allergies   Okra and Torsemide   Review of Systems Review of Systems  Unable to perform ROS: Acuity of condition  Neurological:       Frequent falls  All other systems reviewed and are negative.   Physical Exam Updated Vital Signs BP (!) 95/44   Pulse 94   Temp (!) 95.9 F (35.5 C) (Temporal)   Resp 11   Ht 5\' 2"  (1.575 m)   Wt  111.6 kg (246 lb)   SpO2 (!) 76%   BMI 44.99 kg/m   Physical Exam  Constitutional: She appears well-developed. She appears distressed.  Obese female  HENT:  Head: Normocephalic and atraumatic.  Eyes: Conjunctivae are normal.  Neck: Neck supple.  Cardiovascular: Normal rate and regular rhythm.   No murmur heard. Pulmonary/Chest: Breath sounds normal. She is in respiratory distress.  Abdominal: Soft. There is no tenderness.  Musculoskeletal: She exhibits no edema.  Neurological: She is alert.  Skin: Skin is warm and dry. Rash noted.  Rash in pannus folds. Large area of ecchymosis to left flank. Diffuse back ttp.   Psychiatric: She has a normal mood and affect.  Nursing note  and vitals reviewed.    ED Treatments / Results  Labs (all labs ordered are listed, but only abnormal results are displayed) Labs Reviewed  COMPREHENSIVE METABOLIC PANEL - Abnormal; Notable for the following:       Result Value   Sodium 132 (*)    Potassium 5.6 (*)    Chloride 98 (*)    CO2 13 (*)    BUN 165 (*)    Creatinine, Ser 10.30 (*)    AST 328 (*)    ALT 186 (*)    Total Bilirubin 1.5 (*)    GFR calc non Af Amer 3 (*)    GFR calc Af Amer 4 (*)    Anion gap 21 (*)    All other components within normal limits  CBC WITH DIFFERENTIAL/PLATELET - Abnormal; Notable for the following:    RBC 3.81 (*)    Hemoglobin 11.5 (*)    HCT 35.1 (*)    RDW 16.5 (*)    Platelets 131 (*)    Neutro Abs 8.2 (*)    Lymphs Abs 0.6 (*)    All other components within normal limits  URINALYSIS, ROUTINE W REFLEX MICROSCOPIC - Abnormal; Notable for the following:    Color, Urine AMBER (*)    APPearance CLOUDY (*)    Protein, ur 100 (*)    Bacteria, UA RARE (*)    Squamous Epithelial / LPF 0-5 (*)    All other components within normal limits  PROTIME-INR - Abnormal; Notable for the following:    Prothrombin Time 24.8 (*)    All other components within normal limits  APTT - Abnormal; Notable for the following:    aPTT 38 (*)    All other components within normal limits  BRAIN NATRIURETIC PEPTIDE - Abnormal; Notable for the following:    B Natriuretic Peptide 2,882.1 (*)    All other components within normal limits  CK - Abnormal; Notable for the following:    Total CK 705 (*)    All other components within normal limits  I-STAT CG4 LACTIC ACID, ED - Abnormal; Notable for the following:    Lactic Acid, Venous 4.54 (*)    All other components within normal limits  I-STAT TROPONIN, ED - Abnormal; Notable for the following:    Troponin i, poc 2.36 (*)    All other components within normal limits  I-STAT CHEM 8, ED - Abnormal; Notable for the following:    Sodium 130 (*)    Potassium  5.4 (*)    Chloride 100 (*)    BUN >140 (*)    Creatinine, Ser 10.40 (*)    Calcium, Ion 1.13 (*)    All other components within normal limits  I-STAT ARTERIAL BLOOD GAS, ED - Abnormal; Notable for the following:  pH, Arterial 7.149 (*)    pCO2 arterial 30.9 (*)    Bicarbonate 10.8 (*)    Acid-base deficit 17.0 (*)    All other components within normal limits  CBG MONITORING, ED - Abnormal; Notable for the following:    Glucose-Capillary 126 (*)    All other components within normal limits  CBG MONITORING, ED - Abnormal; Notable for the following:    Glucose-Capillary 171 (*)    All other components within normal limits  CBC  BASIC METABOLIC PANEL  BLOOD GAS, ARTERIAL  MAGNESIUM  PHOSPHORUS  CORTISOL  CBG MONITORING, ED  I-STAT CG4 LACTIC ACID, ED    EKG  EKG Interpretation  Date/Time:  Monday 2016/12/04 19:27:21 EDT Ventricular Rate:  57 PR Interval:    QRS Duration: 223 QT Interval:  554 QTC Calculation: 540 R Axis:   5 Text Interpretation:  Atrial fibrillation Left bundle branch block Baseline wander in lead(s) V2 No old tracing to compare Confirmed by Eber Hong ([id number]) on Dec 04, 2016 7:55:04 PM       Radiology Ct Head Wo Contrast  Result Date: Dec 04, 2016 CLINICAL DATA:  Altered level of consciousness after fall today. EXAM: CT HEAD WITHOUT CONTRAST TECHNIQUE: Contiguous axial images were obtained from the base of the skull through the vertex without intravenous contrast. COMPARISON:  None. FINDINGS: Brain: Mild diffuse cortical atrophy is noted. Mild chronic ischemic white matter disease is noted. No mass effect or midline shift is noted. Ventricular size is within normal limits. There is no evidence of mass lesion, hemorrhage or acute infarction. Vascular: No hyperdense vessel or unexpected calcification. Skull: Normal. Negative for fracture or focal lesion. Sinuses/Orbits: No acute finding. Other: None. IMPRESSION: Mild diffuse cortical atrophy. Mild  chronic ischemic white matter disease. No acute intracranial abnormality seen. Electronically Signed   By: Lupita Raider, M.D.   On: 04-Dec-2016 20:49   Dg Chest Portable 1 View  Result Date: 12/04/2016 CLINICAL DATA:  66 y/o  F; endotracheal tube advancement. EXAM: PORTABLE CHEST 1 VIEW COMPARISON:  12/04/16 chest radiograph FINDINGS: The stable enlarged cardiac silhouette. Transcutaneous pacing pads noted. Enteric tube tip is coiling in the stomach with tip below field of view. Stable diffuse hazy opacification of lungs. Post median sternotomy with wires aligned. No acute osseous abnormality is evident. No pneumothorax. Possible small left effusion. Right central venous catheter stable in position with tip projecting over lower SVC. The endotracheal tube is been advanced with tip projecting 6.2 cm from the carina. IMPRESSION: 1. Endotracheal tube tip projects 6.2 cm from carina, advanced from prior study. 2. Stable diffuse haziness of the lungs, possible small left effusion. 3. Stable enlarged cardiac silhouette. Electronically Signed   By: Mitzi Hansen M.D.   On: December 04, 2016 22:14   Dg Chest Portable 1 View  Result Date: 12/04/2016 CLINICAL DATA:  Central line placement. EXAM: PORTABLE CHEST 1 VIEW COMPARISON:  December 04, 2016 FINDINGS: An ET tube has been placed in the interval terminating near the thoracic inlet. The patient may benefit from advancing 3 cm. NG tube terminates in the stomach. The right IJ terminates near the caval atrial junction. No pneumothorax. Transcutaneous pacers are noted. Cardiomegaly. No nodules or masses. No focal infiltrates. IMPRESSION: 1. An ET tube has been placed in the interval, terminating near the thoracic inlet. Recommend advancing 3 cm. 2. Other support apparatus as above. 3. No other changes. The findings are being called to the emergency room physician. Electronically Signed   By: Gerome Sam III  M.D   On: 2016-12-21 21:12   Dg Chest Portable 1  View  Result Date: Dec 21, 2016 CLINICAL DATA:  Fall and hypotension EXAM: PORTABLE CHEST 1 VIEW COMPARISON:  None. FINDINGS: There is cardiomegaly. Shallow lung inflation. No focal consolidation or overt pulmonary edema. No sizable pleural effusion or pneumothorax. IMPRESSION: Shallow lung inflation and cardiomegaly without overt edema. Electronically Signed   By: Deatra Robinson M.D.   On: December 21, 2016 19:31    Procedures Procedure Name: Intubation Date/Time: 12-21-2016 11:37 PM Performed by: Wynelle Cleveland Pre-anesthesia Checklist: Patient identified, Emergency Drugs available, Suction available and Patient being monitored Oxygen Delivery Method: Nasal cannula Induction Type: Rapid sequence Ventilation: Mask ventilation with difficulty and Two handed mask ventilation required Laryngoscope Size: Glidescope and 3 Grade View: Grade I Tube size: 7.0 mm Number of attempts: 1 Airway Equipment and Method: Video-laryngoscopy Placement Confirmation: ETT inserted through vocal cords under direct vision,  Positive ETCO2,  CO2 detector and Breath sounds checked- equal and bilateral Secured at: 22 cm Tube secured with: ETT holder      (including critical care time)  Medications Ordered in ED Medications  EPINEPHrine (ADRENALIN) 4 mg in dextrose 5 % 250 mL (0.016 mg/mL) infusion (15 mcg/min Intravenous Rate/Dose Change 12/21/2016 2330)  albuterol (PROVENTIL,VENTOLIN) solution continuous neb (10 mg/hr Nebulization New Bag/Given 12-21-16 2231)  heparin injection 5,000 Units (not administered)  0.9 %  sodium chloride infusion ( Intravenous New Bag/Given 12-21-16 2245)  pantoprazole (PROTONIX) injection 40 mg (not administered)  0.9 %  sodium chloride infusion (not administered)  aspirin chewable tablet 81 mg (not administered)  simvastatin (ZOCOR) tablet 40 mg (not administered)  insulin aspart (novoLOG) injection 0-20 Units (not administered)  calcium chloride 1 g in sodium chloride 0.9 % 100 mL IVPB  (0 g Intravenous Stopped 21-Dec-2016 2123)  dextrose 50 % solution 50 mL (50 mLs Intravenous Given December 21, 2016 1930)  sodium chloride 0.9 % bolus 250 mL (0 mLs Intravenous Stopped December 21, 2016 1952)  sodium chloride 0.9 % bolus 250 mL (0 mLs Intravenous Stopped Dec 21, 2016 2120)  sodium chloride 0.9 % bolus 1,000 mL (0 mLs Intravenous Stopped 12/21/16 2159)  norepinephrine (LEVOPHED) 4 mg in dextrose 5 % 250 mL (0.016 mg/mL) infusion (40 mcg/min Intravenous Rate/Dose Change 12-21-2016 2128)  EPINEPHrine (ADRENALIN) 1 MG/10ML injection (1 Syringe Intravenous Given 2016-12-21 2030)  etomidate (AMIDATE) injection (10 mg Intravenous Given 12-21-16 2032)  rocuronium (ZEMURON) injection (100 mg Intravenous Given 12/21/2016 2033)  sodium bicarbonate injection (50 mEq Intravenous Given 12-21-16 2039)  insulin aspart (novoLOG) injection 10 Units (10 Units Intravenous Given 2016/12/21 2126)  dextrose 50 % solution 50 mL (50 mLs Intravenous Given 2016/12/21 2126)     Initial Impression / Assessment and Plan / ED Course  I have reviewed the triage vital signs and the nursing notes.  Pertinent labs & imaging results that were available during my care of the patient were reviewed by me and considered in my medical decision making (see chart for details).     Patient with history of CHF, IDDM, morbid obesity who presents after fall and resulting 8 hour down time on floor. Patient arrived bradycardic and hypotensive. Exam as above. EKG showing wide complex bradycardia. Treated with calcium and bicarb by EMS, patient started on IV fluid boluses.   Initial labs significant for Cr elevated to 10, BUN greater than 140, K 5.4, CO2 14. ABG showing pH 7.1, pCO2 30. Troponin elevated to 2.36. Lactate 4.5.  After head CT, patient developed agonal respirations. She lost pulses, went into  PEA arrest, requiring brief compressions and one dose of epi. She was intubated and right IJ was placed. Pressors were started for hypotension.  Discussed with  Nephrology for acute renal failure, Cardiology for troponinemia/cardiogenic shock. Consulted Critical Care for admission.   Patient and plan of care discussed with Attending physician, Dr. Hyacinth Meeker.     Final Clinical Impressions(s) / ED Diagnoses   Final diagnoses:  NSTEMI (non-ST elevated myocardial infarction) (HCC)  Acute renal failure, unspecified acute renal failure type (HCC)  Cardiogenic shock [facility])    New Prescriptions New Prescriptions   No medications on file     Wynelle Cleveland, MD 12/09/2016 2350    Eber Hong, MD 12/04/16 0000

## 2016-12-03 NOTE — ED Notes (Signed)
RT unsuccessful at placing arterial line.

## 2016-12-03 NOTE — Progress Notes (Signed)
E.T advanced to 3cm to 23 at the upper lip per emergency room doctor.

## 2016-12-03 NOTE — ED Triage Notes (Signed)
Per EMS, pt from home. Pt fell this morning around 1000 and laid face down in the floor until her daughter came home and found her. HR 30s sinus brady on scene. Could not get an accurate BP. Pt slow to respond but following commands and answering questions. 98% on RA. RR 20. Pt given 50 of bicarb and 1g of calcium. CBG 58 initially and then given orange juice, then cbg 70. Pt has large bruise to left flank.

## 2016-12-03 NOTE — ED Provider Notes (Deleted)
The patient is a 66 year old female, she has a known history of congestive heart failure with an ejection fraction of 30%, known history of diabetes and worsening renal failure which has required nephrology consultation after she was admitted in July with anasarca, diuresed and found to have worsening kidney function. Baseline creatinine was found to be at approximately 3.2 at that time. The patient has been home for approximately one month, we do not have much in the way of information as the patient arrives by ambulance transport after being found on the ground face down by a family member who she lives with who came home from work today to find her in that position. The patient does recall falling, we do not have much more information. She was found altered, confused, hypotensive, bradycardic and in a wide-complex bradycardia. She was given doses of both bicarbonate as well as calcium prehospital but this had no change on her EKG. She remained hypotensive and bradycardic. The patient endorses shortness of breath and swelling, she is unable to give much more information, level V caveat applies secondary to altered mental status and the critical nature of her situation.  On exam the patient is edematous diffusely, she has increased work of breathing, she is hypoxic to 90%, she has wheezing and rales, she has a soft abdomen and underneath the skin folds of her pannus especially on the left there is significant skin breakdown and what appears to be a yeast infection. She has bilateral lower extremity edema, she is bradycardic and has weak peripheral pulses, she is awake but has difficulty communicating with significant slurred speech.  The patient is critically ill with likely worsening renal function, dehydration, trauma, generalized weakness. She will need a broad workup including CT scan of the brain, chest x-ray, EKG, labs and medical resuscitation.  The patient has multiple abnormal labs including an  significantly elevated troponin at 2.36, and acute kidney failure at creatinine of 10.4 and a BUN of over 140. The lactic acid is 4.5. Cardiology and nephrology have both been paged for evaluation.  The patient's blood pressure is persistently hypotensive, she is getting IV fluids, she has IV access, her mental status is maintaining and she is able answer questions but is severely weak. Her EKG has been performed and shows wide-complex QRS, appears to have some component of heart block with dropped QRS, possibly atrial fibrillation  Nephrology and Cards paged Cardiology - paged. Dr. Ronelle Nigh recommends CCM admit - Echo.  Trop of 2.4 without STEMI suggestive of stress or renal pseudoischemia CCM paged   D/w Dr. Dora Sims who will assist and consult from Nephrology    EKG Interpretation  Date/Time:  Monday 01-01-2017 19:27:21 EDT Ventricular Rate:  57 PR Interval:    QRS Duration: 223 QT Interval:  554 QTC Calculation: 540 R Axis:   5 Text Interpretation:  Atrial fibrillation Left bundle branch block Baseline wander in lead(s) V2 No old tracing to compare Confirmed by Eber Hong ([id number]) on 2017/01/01 7:55:04 PM       CRITICAL CARE Performed by: Vida Roller Total critical care time: 35 minutes Critical care time was exclusive of separately billable procedures and treating other patients. Critical care was necessary to treat or prevent imminent or life-threatening deterioration. Critical care was time spent personally by me on the following activities: development of treatment plan with patient and/or surrogate as well as nursing, discussions with consultants, evaluation of patient's response to treatment, examination of patient, obtaining history from patient or  surrogate, ordering and performing treatments and interventions, ordering and review of laboratory studies, ordering and review of radiographic studies, pulse oximetry and re-evaluation of patient's  condition.  Medical screening examination/treatment/procedure(s) were conducted as a shared visit with non-physician practitioner(s) and myself.  I personally evaluated the patient during the encounter.  Clinical Impression:   Final diagnoses:  NSTEMI (non-ST elevated myocardial infarction) (HCC)  Acute renal failure, unspecified acute renal failure type [facility])  Cardiogenic shock (HCC)           Eber Hong, MD 11/21/2016 2357

## 2016-12-03 NOTE — Progress Notes (Signed)
Attempted arterial line per Dr.Miller but was unsuccessfully with two attempts.

## 2016-12-03 NOTE — ED Notes (Signed)
RT in room to draw blood gas

## 2016-12-04 ENCOUNTER — Inpatient Hospital Stay (HOSPITAL_COMMUNITY): Payer: Medicare Other

## 2016-12-04 ENCOUNTER — Encounter (HOSPITAL_COMMUNITY): Payer: Self-pay

## 2016-12-04 DIAGNOSIS — G9341 Metabolic encephalopathy: Secondary | ICD-10-CM

## 2016-12-04 DIAGNOSIS — N179 Acute kidney failure, unspecified: Secondary | ICD-10-CM

## 2016-12-04 DIAGNOSIS — E875 Hyperkalemia: Secondary | ICD-10-CM

## 2016-12-04 DIAGNOSIS — R579 Shock, unspecified: Secondary | ICD-10-CM

## 2016-12-04 LAB — MAGNESIUM: MAGNESIUM: 2.5 mg/dL — AB (ref 1.7–2.4)

## 2016-12-04 LAB — CBC
HCT: 36.7 % (ref 36.0–46.0)
HEMOGLOBIN: 12.1 g/dL (ref 12.0–15.0)
MCH: 30 pg (ref 26.0–34.0)
MCHC: 33 g/dL (ref 30.0–36.0)
MCV: 90.8 fL (ref 78.0–100.0)
Platelets: 162 10*3/uL (ref 150–400)
RBC: 4.04 MIL/uL (ref 3.87–5.11)
RDW: 16.1 % — AB (ref 11.5–15.5)
WBC: 19 10*3/uL — ABNORMAL HIGH (ref 4.0–10.5)

## 2016-12-04 LAB — GLUCOSE, CAPILLARY
GLUCOSE-CAPILLARY: 176 mg/dL — AB (ref 65–99)
GLUCOSE-CAPILLARY: 107 mg/dL — AB (ref 65–99)
Glucose-Capillary: 176 mg/dL — ABNORMAL HIGH (ref 65–99)
Glucose-Capillary: 209 mg/dL — ABNORMAL HIGH (ref 65–99)
Glucose-Capillary: 162 mg/dL — ABNORMAL HIGH (ref 65–99)
Glucose-Capillary: 159 mg/dL — ABNORMAL HIGH (ref 65–99)
Glucose-Capillary: 149 mg/dL — ABNORMAL HIGH (ref 65–99)

## 2016-12-04 LAB — POCT I-STAT 3, ART BLOOD GAS (G3+)
ACID-BASE DEFICIT: 22 mmol/L — AB (ref 0.0–2.0)
Acid-base deficit: 18 mmol/L — ABNORMAL HIGH (ref 0.0–2.0)
BICARBONATE: 11.9 mmol/L — AB (ref 20.0–28.0)
BICARBONATE: 9.8 mmol/L — AB (ref 20.0–28.0)
O2 SAT: 100 %
O2 SAT: 98 %
PCO2 ART: 25 mmHg — AB (ref 32.0–48.0)
PO2 ART: 300 mmHg — AB (ref 83.0–108.0)
PO2 ART: 110 mmHg — AB (ref 83.0–108.0)
Patient temperature: 88
Patient temperature: 90
TCO2: 14 mmol/L (ref 0–100)
TCO2: 11 mmol/L (ref 0–100)
pCO2 arterial: 47.2 mmHg (ref 32.0–48.0)
pH, Arterial: 6.967 — CL (ref 7.350–7.450)
pH, Arterial: 7.173 — CL (ref 7.350–7.450)

## 2016-12-04 LAB — BLOOD GAS, ARTERIAL
Acid-base deficit: 15.5 mmol/L — ABNORMAL HIGH (ref 0.0–2.0)
BICARBONATE: 11 mmol/L — AB (ref 20.0–28.0)
Drawn by: 330991
FIO2: 50
O2 Saturation: 98.7 %
PEEP/CPAP: 10 cmH2O
PRESSURE CONTROL: 20 cmH2O
Patient temperature: 93.2
RATE: 30 resp/min
pCO2 arterial: 25.1 mmHg — ABNORMAL LOW (ref 32.0–48.0)
pH, Arterial: 7.242 — ABNORMAL LOW (ref 7.350–7.450)
pO2, Arterial: 160 mmHg — ABNORMAL HIGH (ref 83.0–108.0)

## 2016-12-04 LAB — BASIC METABOLIC PANEL
Anion gap: 19 — ABNORMAL HIGH (ref 5–15)
BUN: 148 mg/dL — ABNORMAL HIGH (ref 6–20)
CALCIUM: 9.1 mg/dL (ref 8.9–10.3)
CO2: 12 mmol/L — ABNORMAL LOW (ref 22–32)
CREATININE: 8.43 mg/dL — AB (ref 0.44–1.00)
Chloride: 100 mmol/L — ABNORMAL LOW (ref 101–111)
GFR calc non Af Amer: 4 mL/min — ABNORMAL LOW (ref >60)
GFR, EST AFRICAN AMERICAN: 5 mL/min — AB (ref >60)
Glucose, Bld: 189 mg/dL — ABNORMAL HIGH (ref 65–99)
Potassium: 4.5 mmol/L (ref 3.5–5.1)
SODIUM: 131 mmol/L — AB (ref 135–145)

## 2016-12-04 LAB — RENAL FUNCTION PANEL
ALBUMIN: 3.3 g/dL — AB (ref 3.5–5.0)
ANION GAP: 19 — AB (ref 5–15)
Albumin: 3.1 g/dL — ABNORMAL LOW (ref 3.5–5.0)
Anion gap: 14 (ref 5–15)
BUN: 155 mg/dL — ABNORMAL HIGH (ref 6–20)
BUN: 96 mg/dL — ABNORMAL HIGH (ref 6–20)
CALCIUM: 9.2 mg/dL (ref 8.9–10.3)
CALCIUM: 8.3 mg/dL — AB (ref 8.9–10.3)
CO2: 12 mmol/L — ABNORMAL LOW (ref 22–32)
CO2: 21 mmol/L — AB (ref 22–32)
CREATININE: 5.87 mg/dL — AB (ref 0.44–1.00)
Chloride: 100 mmol/L — ABNORMAL LOW (ref 101–111)
Chloride: 98 mmol/L — ABNORMAL LOW (ref 101–111)
Creatinine, Ser: 8.99 mg/dL — ABNORMAL HIGH (ref 0.44–1.00)
GFR calc Af Amer: 8 mL/min — ABNORMAL LOW (ref >60)
GFR calc non Af Amer: 4 mL/min — ABNORMAL LOW (ref >60)
GFR calc non Af Amer: 7 mL/min — ABNORMAL LOW (ref >60)
GFR, EST AFRICAN AMERICAN: 5 mL/min — AB (ref >60)
GLUCOSE: 197 mg/dL — AB (ref 65–99)
GLUCOSE: 150 mg/dL — AB (ref 65–99)
PHOSPHORUS: 10.7 mg/dL — AB (ref 2.5–4.6)
Phosphorus: 6.2 mg/dL — ABNORMAL HIGH (ref 2.5–4.6)
Potassium: 4.6 mmol/L (ref 3.5–5.1)
Potassium: 4.1 mmol/L (ref 3.5–5.1)
SODIUM: 131 mmol/L — AB (ref 135–145)
Sodium: 133 mmol/L — ABNORMAL LOW (ref 135–145)

## 2016-12-04 LAB — PROCALCITONIN: PROCALCITONIN: 0.19 ng/mL

## 2016-12-04 LAB — TROPONIN I
TROPONIN I: 2.97 ng/mL — AB (ref <0.03)
Troponin I: 2.95 ng/mL (ref <0.03)

## 2016-12-04 LAB — MRSA PCR SCREENING: MRSA by PCR: NEGATIVE

## 2016-12-04 LAB — PHOSPHORUS: Phosphorus: 10 mg/dL — ABNORMAL HIGH (ref 2.5–4.6)

## 2016-12-04 LAB — LACTIC ACID, PLASMA: Lactic Acid, Venous: 4.6 mmol/L (ref 0.5–1.9)

## 2016-12-04 LAB — CORTISOL: Cortisol, Plasma: 63.4 ug/dL

## 2016-12-04 MED ORDER — STERILE WATER FOR INJECTION IV SOLN
INTRAVENOUS | Status: DC
  Administered 2016-12-04 – 2016-12-05 (×9): via INTRAVENOUS_CENTRAL
  Filled 2016-12-04 (×13): qty 150

## 2016-12-04 MED ORDER — MIDAZOLAM HCL 2 MG/2ML IJ SOLN
1.0000 mg | INTRAMUSCULAR | Status: AC | PRN
  Administered 2016-12-04 (×3): 1 mg via INTRAVENOUS
  Filled 2016-12-04 (×2): qty 2

## 2016-12-04 MED ORDER — HEPARIN (PORCINE) 2000 UNITS/L FOR CRRT
INTRAVENOUS_CENTRAL | Status: DC | PRN
  Filled 2016-12-04: qty 1000

## 2016-12-04 MED ORDER — FENTANYL CITRATE (PF) 100 MCG/2ML IJ SOLN
50.0000 ug | INTRAMUSCULAR | Status: DC | PRN
  Administered 2016-12-04 – 2016-12-06 (×15): 50 ug via INTRAVENOUS
  Filled 2016-12-04 (×17): qty 2

## 2016-12-04 MED ORDER — ORAL CARE MOUTH RINSE
15.0000 mL | Freq: Four times a day (QID) | OROMUCOSAL | Status: DC
  Administered 2016-12-04 – 2016-12-07 (×13): 15 mL via OROMUCOSAL

## 2016-12-04 MED ORDER — FLUCONAZOLE IN SODIUM CHLORIDE 400-0.9 MG/200ML-% IV SOLN
400.0000 mg | Freq: Once | INTRAVENOUS | Status: AC
  Administered 2016-12-04: 400 mg via INTRAVENOUS
  Filled 2016-12-04: qty 200

## 2016-12-04 MED ORDER — FENTANYL CITRATE (PF) 100 MCG/2ML IJ SOLN
50.0000 ug | INTRAMUSCULAR | Status: AC | PRN
  Administered 2016-12-04 (×3): 50 ug via INTRAVENOUS
  Filled 2016-12-04 (×3): qty 2

## 2016-12-04 MED ORDER — PRISMASOL BGK 4/2.5 32-4-2.5 MEQ/L IV SOLN
INTRAVENOUS | Status: DC
  Administered 2016-12-04 – 2016-12-05 (×12): via INTRAVENOUS_CENTRAL
  Filled 2016-12-04 (×13): qty 5000

## 2016-12-04 MED ORDER — PRISMASOL BGK 4/2.5 32-4-2.5 MEQ/L IV SOLN
INTRAVENOUS | Status: DC
  Administered 2016-12-04 – 2016-12-05 (×4): via INTRAVENOUS_CENTRAL
  Filled 2016-12-04 (×5): qty 5000

## 2016-12-04 MED ORDER — CHLORHEXIDINE GLUCONATE 0.12% ORAL RINSE (MEDLINE KIT)
15.0000 mL | Freq: Two times a day (BID) | OROMUCOSAL | Status: DC
  Administered 2016-12-04 – 2016-12-07 (×7): 15 mL via OROMUCOSAL

## 2016-12-04 MED ORDER — NEPRO/CARBSTEADY PO LIQD
1000.0000 mL | ORAL | Status: DC
  Administered 2016-12-04 – 2016-12-06 (×3): 1000 mL
  Filled 2016-12-04 (×5): qty 1000

## 2016-12-04 MED ORDER — NYSTATIN 100000 UNIT/GM EX POWD
Freq: Three times a day (TID) | CUTANEOUS | Status: DC
  Administered 2016-12-04 – 2016-12-06 (×9): via TOPICAL
  Filled 2016-12-04: qty 15

## 2016-12-04 MED ORDER — MIDAZOLAM HCL 2 MG/2ML IJ SOLN
1.0000 mg | INTRAMUSCULAR | Status: DC | PRN
  Administered 2016-12-04 – 2016-12-06 (×13): 1 mg via INTRAVENOUS
  Filled 2016-12-04 (×15): qty 2

## 2016-12-04 MED ORDER — PRO-STAT SUGAR FREE PO LIQD
30.0000 mL | Freq: Three times a day (TID) | ORAL | Status: DC
  Administered 2016-12-04 – 2016-12-07 (×9): 30 mL
  Filled 2016-12-04 (×10): qty 30

## 2016-12-04 MED ORDER — FLUCONAZOLE IN SODIUM CHLORIDE 400-0.9 MG/200ML-% IV SOLN
400.0000 mg | INTRAVENOUS | Status: DC
  Administered 2016-12-05 – 2016-12-07 (×3): 400 mg via INTRAVENOUS
  Filled 2016-12-04 (×3): qty 200

## 2016-12-04 MED ORDER — HEPARIN SODIUM (PORCINE) 1000 UNIT/ML DIALYSIS
1000.0000 [IU] | INTRAMUSCULAR | Status: DC | PRN
  Filled 2016-12-04: qty 6

## 2016-12-04 NOTE — Progress Notes (Signed)
Initial Nutrition Assessment  DOCUMENTATION CODES:   Morbid obesity  INTERVENTION:    Initiate Nepro with Carb Steady at goal rate of 30 ml/h (720 ml per day) and Prostat 30 ml TID  TF regimen to provide 1596 kcals, 103 gm protein, 523 ml of free water  NUTRITION DIAGNOSIS:   Inadequate oral intake related to inability to eat as evidenced by NPO status  GOAL:   Provide needs based on ASPEN/SCCM guidelines  MONITOR:   Vent status, TF tolerance, Labs, Weight trends, Skin, I & O's  REASON FOR ASSESSMENT:   Consult Enteral/tube feeding initiation and management  ASSESSMENT:   [name] presented to [facility] ED on 8/20 after she was found down on the floor of a home by her daughter. She apparently was normal earlier that morning when daughter left for work. When daughter returned home from work around 5 PM, patient informed daughter that she had fallen onto the floor around 9 or 10 AM and had been unable to get up for the whole day.  Patient is currently intubated on ventilator support Temp (24hrs), Avg:95.6 F (35.3 C), Min:90 F (32.2 C), Max:98.4 F (36.9 C)  OGT in place  Pt s/p fall at home. Developed brief cardiac arrest in CT scan. Worsening renal failure. Nephrology consulted. Started on CRRT. Labs reviewed. Na 131 (L). Mg 2.5 (H). Phos 10.0 (H). Medications reviewed. CBG's [phone].  Unable to complete Nutrition-Focused physical exam at this time.   Diet Order:  Diet NPO time specified  Skin:  Wound (see comment) (skin breakdown noted to pannus with erythema)  Last BM:  8/21  Height:   Ht Readings from Last 1 Encounters:  December 23, 2016 5\' 2"  (1.575 m)   Weight:   Wt Readings from Last 1 Encounters:  12/04/16 264 lb 15.9 oz (120.2 kg)   Ideal Body Weight:  50 kg  BMI:  Body mass index is 48.47 kg/m.  Estimated Nutritional Needs:   Kcal:  1320-1680  Protein:  >/= 100 gm  Fluid:  per MD  EDUCATION NEEDS:   No education needs identified at  this time  Maureen Chatters, RD, LDN Pager #: [phone] After-Hours Pager #: [phone]

## 2016-12-04 NOTE — Progress Notes (Signed)
Vent changes made per verbal MD order/ABG results.

## 2016-12-04 NOTE — Procedures (Signed)
Arterial Catheter Insertion Procedure Note [name] [id number] 02/25/1951  Procedure: Insertion of Arterial Catheter  Indications: Blood pressure monitoring  Procedure Details Consent: Unable to obtain consent because of emergent medical necessity. Time Out: Verified patient identification, verified procedure, site/side was marked, verified correct patient position, special equipment/implants available, medications/allergies/relevent history reviewed, required imaging and test results available.  Performed  Maximum sterile technique was used including antiseptics, cap, gloves, gown, hand hygiene, mask and sheet. Skin prep: Chlorhexidine; local anesthetic administered 20 gauge catheter was inserted into left brachial artery using the Seldinger technique.  Evaluation Blood flow good; BP tracing good. Complications: No apparent complications.   [name] 12/04/2016

## 2016-12-04 NOTE — Progress Notes (Signed)
CRITICAL VALUE ALERT  Critical Value:  Lactic acid= 4.6, Trop I= 2.95  Date & Time Notied: 12/04/16 0505  Provider Notified: Dr Jillene Bucks  Orders Received/Actions taken: none

## 2016-12-04 NOTE — Progress Notes (Signed)
   12/04/16 1000  Clinical Encounter Type  Visited With Family  Visit Type Initial;Psychological support;Social support  Referral From Nurse  Spiritual Encounters  Spiritual Needs Emotional  Stress Factors  Patient Stress Factors None identified  Family Stress Factors Loss of control;Major life changes   Chaplain offered and provided emotional and spiritual support to the loved one of the Pt. Chaplain stayed with family while they were updated and provided prayer.

## 2016-12-04 NOTE — Procedures (Signed)
Central Venous Catheter Insertion Procedure Note [name] [id number] Jul 30, 1950  Procedure: Insertion of Central Venous Catheter Indications: HD catheter  Procedure Details Consent: Unable to obtain consent because of emergent medical necessity. Time Out: Verified patient identification, verified procedure, site/side was marked, verified correct patient position, special equipment/implants available, medications/allergies/relevent history reviewed, required imaging and test results available.  Performed  Maximum sterile technique was used including antiseptics, cap, gloves, gown, hand hygiene, mask and sheet. Skin prep: Chlorhexidine; local anesthetic administered A antimicrobial bonded/coated triple lumen catheter was placed in the left internal jugular vein using the Seldinger technique.  Evaluation Blood flow good Complications: No apparent complications Patient did tolerate procedure well. Chest X-ray ordered to verify placement.  CXR: normal. CXR: No obvuious PTX, line crosses midline [name] 12/04/2016, 2:08 AM

## 2016-12-04 NOTE — Progress Notes (Signed)
Subjective: Interval History: sedated on vent, 2 pressors  Objective: Vital signs in last 24 hours: Temp:  [90 F (32.2 C)-95.9 F (35.5 C)] 95.4 F (35.2 C) (08/21 0815) Pulse Rate:  [34-126] 75 (08/21 0815) Resp:  [0-34] 28 (08/21 0815) BP: (38-135)/(19-98) 43/31 (08/21 0800) SpO2:  [49 %-100 %] 100 % (08/21 0815) FiO2 (%):  [50 %-100 %] 50 % (08/21 0600) Weight:  [111.6 kg (246 lb)-120.2 kg (264 lb 15.9 oz)] 120.2 kg (264 lb 15.9 oz) (08/21 0615) Weight change:   Intake/Output from previous day: 08/20 0701 - 08/21 0700 In: 2839.1 [I.V.:1479.1; IV Piggyback:1360] Out: 476  Intake/Output this shift: Total I/O In: 95 [I.V.:95] Out: 93 [Other:93]  General appearance: moderately obese, pale and sedated ,on vent, moves L side, pale Neck: IJ cath Resp: rales bilaterally Cardio: S1, S2 normal and systolic murmur: holosystolic 2/6, blowing at apex GI: obese, striae, pos bs,  Extremities: edema 3+  Lab Results:  Recent Labs  January 01, 2017 1858 01/01/17 1917 12/04/16 0530  WBC 9.3  --  19.0*  HGB 11.5* 13.3 12.1  HCT 35.1* 39.0 36.7  PLT 131*  --  162   BMET:  Recent Labs  12/04/16 0405 12/04/16 0530  NA 131* 131*  K 4.6 4.5  CL 100* 100*  CO2 12* 12*  GLUCOSE 197* 189*  BUN 155* 148*  CREATININE 8.99* 8.43*  CALCIUM 9.2 9.1   No results for input(s): PTH in the last 72 hours. Iron Studies: No results for input(s): IRON, TIBC, TRANSFERRIN, FERRITIN in the last 72 hours.  Studies/Results: Ct Head Wo Contrast  Result Date: 01/01/17 CLINICAL DATA:  Altered level of consciousness after fall today. EXAM: CT HEAD WITHOUT CONTRAST TECHNIQUE: Contiguous axial images were obtained from the base of the skull through the vertex without intravenous contrast. COMPARISON:  None. FINDINGS: Brain: Mild diffuse cortical atrophy is noted. Mild chronic ischemic white matter disease is noted. No mass effect or midline shift is noted. Ventricular size is within normal limits. There  is no evidence of mass lesion, hemorrhage or acute infarction. Vascular: No hyperdense vessel or unexpected calcification. Skull: Normal. Negative for fracture or focal lesion. Sinuses/Orbits: No acute finding. Other: None. IMPRESSION: Mild diffuse cortical atrophy. Mild chronic ischemic white matter disease. No acute intracranial abnormality seen. Electronically Signed   By: Lupita Raider, M.D.   On: 01-Jan-2017 20:49   Dg Chest Port 1 View  Result Date: 12/04/2016 CLINICAL DATA:  Central line placement. EXAM: PORTABLE CHEST 1 VIEW COMPARISON:  Radiographs yesterday at 2137 hour FINDINGS: New left internal jugular central venous catheter in the region of the proximal SVC/ brachiocephalic confluence. No pneumothorax. Tip of the right internal jugular central venous catheter in the mid SVC. Endotracheal tube tip remains 6.1 cm from the carina. Enteric tube tip and side-port below the diaphragm in the stomach. Post median sternotomy. Stable cardiomegaly. Bibasilar atelectasis. Hazy lung opacity without convincing pulmonary edema. IMPRESSION: 1. New left internal jugular central venous catheter tip in the region of the proximal SVC/brachiocephalic confluence. No pneumothorax. 2. Right central line, endotracheal tube, and enteric tube remain in place. 3. Stable cardiomegaly.  Bibasilar atelectasis. Electronically Signed   By: Rubye Oaks M.D.   On: 12/04/2016 02:19   Dg Chest Portable 1 View  Result Date: 01/01/17 CLINICAL DATA:  66 y/o  F; endotracheal tube advancement. EXAM: PORTABLE CHEST 1 VIEW COMPARISON:  Jan 01, 2017 chest radiograph FINDINGS: The stable enlarged cardiac silhouette. Transcutaneous pacing pads noted. Enteric tube tip is coiling  in the stomach with tip below field of view. Stable diffuse hazy opacification of lungs. Post median sternotomy with wires aligned. No acute osseous abnormality is evident. No pneumothorax. Possible small left effusion. Right central venous catheter stable in  position with tip projecting over lower SVC. The endotracheal tube is been advanced with tip projecting 6.2 cm from the carina. IMPRESSION: 1. Endotracheal tube tip projects 6.2 cm from carina, advanced from prior study. 2. Stable diffuse haziness of the lungs, possible small left effusion. 3. Stable enlarged cardiac silhouette. Electronically Signed   By: Mitzi Hansen M.D.   On: 11/14/2016 22:14   Dg Chest Portable 1 View  Result Date: 12/13/2016 CLINICAL DATA:  Central line placement. EXAM: PORTABLE CHEST 1 VIEW COMPARISON:  December 03, 2016 FINDINGS: An ET tube has been placed in the interval terminating near the thoracic inlet. The patient may benefit from advancing 3 cm. NG tube terminates in the stomach. The right IJ terminates near the caval atrial junction. No pneumothorax. Transcutaneous pacers are noted. Cardiomegaly. No nodules or masses. No focal infiltrates. IMPRESSION: 1. An ET tube has been placed in the interval, terminating near the thoracic inlet. Recommend advancing 3 cm. 2. Other support apparatus as above. 3. No other changes. The findings are being called to the emergency room physician. Electronically Signed   By: Gerome Sam III M.D   On: 11/22/2016 21:12   Dg Chest Portable 1 View  Result Date: 12/02/2016 CLINICAL DATA:  Fall and hypotension EXAM: PORTABLE CHEST 1 VIEW COMPARISON:  None. FINDINGS: There is cardiomegaly. Shallow lung inflation. No focal consolidation or overt pulmonary edema. No sizable pleural effusion or pneumothorax. IMPRESSION: Shallow lung inflation and cardiomegaly without overt edema. Electronically Signed   By: Deatra Robinson M.D.   On: 11/20/2016 19:31    I have reviewed the patient's current medications.  Assessment/Plan: 1 CKD5/AKI  Oliguric , acidemic.  [place]. Need to ^ acid and solute clearance.  Outlook for recovery slim 2 CM 3 CVA 4 Resp failure 5 shock on pressors 6 ^ WBC on AB P ^ flow rate, ^ bicarb, pressors, AB, vent,  counsel family    LOS: 1 day   Amandeep Hogston L 12/04/2016,8:42 AM

## 2016-12-04 NOTE — Progress Notes (Signed)
PULMONARY / CRITICAL CARE MEDICINE   Name: [name] MRN: [id number] DOB: Oct 25, 1950    ADMISSION DATE:  December 17, 2016 CONSULTATION DATE:  12-17-2016  REFERRING MD:  Hyacinth Meeker  CHIEF COMPLAINT:  Fall  HISTORY OF PRESENT ILLNESS:  Pt is encephelopathic; therefore, this HPI is obtained from chart review. [name] is a 66 y.o. female with PMH as outlined below. He presented to [facility] ED on 8/20 after she was found down on the floor of a home by her daughter. She apparently was normal earlier that morning when daughter left for work. When daughter returned home from work around 5 PM, patient informed daughter that she had fallen onto the floor around 9 or 10 AM and had been unable to get up for the whole day.  On arrival to ED, she was hypotensive and bradycardic. She was taken for CT scan of the head and while in CT, she became cyanotic and unresponsive. Shortly thereafter, she was noted to be pulseless. ACLS was started and ROSC was achieved within 30 seconds. She was intubated during this timeframe.  She was later found to have metabolic acidosis along with hypokalemia and acute on chronic renal failure (she had apparently been seen by nephrology in the past; however, was deemed to be a poor candidate for long-term dialysis due to her cardiac function). She was also previously listed as DO NOT RESUSCITATE; however, daughter states that the only reason for this was because patient would never one to be placed on machines for a long time period. She would however be okay with short-term intubation and ACLS if needed. She is also okay with short-term dialysis for acute issues.  She was evaluated by nephrology who opted to begin CRRT.   SUBJECTIVE: On vent, unresponsive.  No further events overnight since arrest and intubation  VITAL SIGNS: BP (!) 43/31   Pulse 73   Temp (!) 96.1 F (35.6 C)   Resp 13   Ht 5\' 2"  (1.575 m)   Wt 120.2 kg (264 lb 15.9 oz)   SpO2 100%   BMI 48.47  kg/m   HEMODYNAMICS: CVP:  [25 mmHg-27 mmHg] 25 mmHg  VENTILATOR SETTINGS: Vent Mode: PCV FiO2 (%):  [50 %-100 %] 50 % Set Rate:  [18 bmp-30 bmp] 30 bmp Vt Set:  [400 mL] 400 mL PEEP:  [10 cmH20] 10 cmH20 Plateau Pressure:  [18 cmH20-27 cmH20] 26 cmH20  INTAKE / OUTPUT: I/O last 3 completed shifts: In: 2839.1 [I.V.:1479.1; IV Piggyback:1360] Out: 476 [Other:476]  PHYSICAL EXAMINATION: General: Chronically ill-appearing female, obese, critically ill. Neuro: Sedated, unresponsive. HEENT: [place]/AT. PERRL, sclerae anicteric. Cardiovascular: RRR, no M/R/G.  Lungs: Respirations even and unlabored.  CTA bilaterally, No W/R/R. Abdomen: Obese, soft, NT/ND. Skin breakdown noted to pannus with erythema, mainly on the left. Musculoskeletal: No gross deformities, no edema.  Skin: Intact, warm, no rashes. Skin breakdown as above.  LABS:  BMET  Recent Labs Lab 12-17-2016 1858 12-17-16 1917 12/04/16 0405 12/04/16 0530  NA 132* 130* 131* 131*  K 5.6* 5.4* 4.6 4.5  CL 98* 100* 100* 100*  CO2 13*  --  12* 12*  BUN 165* >140* 155* 148*  CREATININE 10.30* 10.40* 8.99* 8.43*  GLUCOSE 74 66 197* 189*   Electrolytes  Recent Labs Lab 2016/12/17 1858 12/04/16 0405 12/04/16 0530  CALCIUM 10.0 9.2 9.1  MG  --   --  2.5*  PHOS  --  10.7* 10.0*   CBC  Recent Labs Lab 12-17-16 1858 17-Dec-2016 1917 12/04/16 0530  WBC 9.3  --  19.0*  HGB 11.5* 13.3 12.1  HCT 35.1* 39.0 36.7  PLT 131*  --  162    Coag's  Recent Labs Lab 12/04/2016 1858  APTT 38*  INR 2.20    Sepsis Markers  Recent Labs Lab 12/11/2016 1917 12/04/16 0405  LATICACIDVEN 4.54* 4.6*  PROCALCITON  --  0.19    ABG  Recent Labs Lab 12/04/16 0120 12/04/16 0237 12/04/16 0456  PHART 6.967* 7.173* 7.242*  PCO2ART 47.2 25.0* 25.1*  PO2ART 300.0* 110.0* 160*    Liver Enzymes  Recent Labs Lab 12/08/2016 1858 12/04/16 0405  AST 328*  --   ALT 186*  --   ALKPHOS 45  --   BILITOT 1.5*  --   ALBUMIN 3.5  3.3*    Cardiac Enzymes  Recent Labs Lab 12/04/16 0405 12/04/16 0530  TROPONINI 2.95* 2.97*    Glucose  Recent Labs Lab 11/22/2016 1925 11/27/2016 2116 11/30/2016 2207 12/04/16 0212 12/04/16 0416 12/04/16 0744  GLUCAP 69 126* 171* 176* 209* 176*    Imaging Ct Head Wo Contrast  Result Date: 11/22/2016 CLINICAL DATA:  Altered level of consciousness after fall today. EXAM: CT HEAD WITHOUT CONTRAST TECHNIQUE: Contiguous axial images were obtained from the base of the skull through the vertex without intravenous contrast. COMPARISON:  None. FINDINGS: Brain: Mild diffuse cortical atrophy is noted. Mild chronic ischemic white matter disease is noted. No mass effect or midline shift is noted. Ventricular size is within normal limits. There is no evidence of mass lesion, hemorrhage or acute infarction. Vascular: No hyperdense vessel or unexpected calcification. Skull: Normal. Negative for fracture or focal lesion. Sinuses/Orbits: No acute finding. Other: None. IMPRESSION: Mild diffuse cortical atrophy. Mild chronic ischemic white matter disease. No acute intracranial abnormality seen. Electronically Signed   By: Lupita Raider, M.D.   On: 11/27/2016 20:49   Dg Chest Port 1 View  Result Date: 12/04/2016 CLINICAL DATA:  Central line placement. EXAM: PORTABLE CHEST 1 VIEW COMPARISON:  Radiographs yesterday at 2137 hour FINDINGS: New left internal jugular central venous catheter in the region of the proximal SVC/ brachiocephalic confluence. No pneumothorax. Tip of the right internal jugular central venous catheter in the mid SVC. Endotracheal tube tip remains 6.1 cm from the carina. Enteric tube tip and side-port below the diaphragm in the stomach. Post median sternotomy. Stable cardiomegaly. Bibasilar atelectasis. Hazy lung opacity without convincing pulmonary edema. IMPRESSION: 1. New left internal jugular central venous catheter tip in the region of the proximal SVC/brachiocephalic confluence. No  pneumothorax. 2. Right central line, endotracheal tube, and enteric tube remain in place. 3. Stable cardiomegaly.  Bibasilar atelectasis. Electronically Signed   By: Rubye Oaks M.D.   On: 12/04/2016 02:19   Dg Chest Portable 1 View  Result Date: 11/16/2016 CLINICAL DATA:  66 y/o  F; endotracheal tube advancement. EXAM: PORTABLE CHEST 1 VIEW COMPARISON:  11/24/2016 chest radiograph FINDINGS: The stable enlarged cardiac silhouette. Transcutaneous pacing pads noted. Enteric tube tip is coiling in the stomach with tip below field of view. Stable diffuse hazy opacification of lungs. Post median sternotomy with wires aligned. No acute osseous abnormality is evident. No pneumothorax. Possible small left effusion. Right central venous catheter stable in position with tip projecting over lower SVC. The endotracheal tube is been advanced with tip projecting 6.2 cm from the carina. IMPRESSION: 1. Endotracheal tube tip projects 6.2 cm from carina, advanced from prior study. 2. Stable diffuse haziness of the lungs, possible small left effusion. 3. Stable  enlarged cardiac silhouette. Electronically Signed   By: Mitzi Hansen M.D.   On: 12-26-2016 22:14   Dg Chest Portable 1 View  Result Date: 12-26-2016 CLINICAL DATA:  Central line placement. EXAM: PORTABLE CHEST 1 VIEW COMPARISON:  2016-12-26 FINDINGS: An ET tube has been placed in the interval terminating near the thoracic inlet. The patient may benefit from advancing 3 cm. NG tube terminates in the stomach. The right IJ terminates near the caval atrial junction. No pneumothorax. Transcutaneous pacers are noted. Cardiomegaly. No nodules or masses. No focal infiltrates. IMPRESSION: 1. An ET tube has been placed in the interval, terminating near the thoracic inlet. Recommend advancing 3 cm. 2. Other support apparatus as above. 3. No other changes. The findings are being called to the emergency room physician. Electronically Signed   By: Gerome Sam III M.D   On: 12/26/16 21:12   Dg Chest Portable 1 View  Result Date: 2016-12-26 CLINICAL DATA:  Fall and hypotension EXAM: PORTABLE CHEST 1 VIEW COMPARISON:  None. FINDINGS: There is cardiomegaly. Shallow lung inflation. No focal consolidation or overt pulmonary edema. No sizable pleural effusion or pneumothorax. IMPRESSION: Shallow lung inflation and cardiomegaly without overt edema. Electronically Signed   By: Deatra Robinson M.D.   On: December 26, 2016 19:31     STUDIES:  CT head 8/20 > no acute process. CXR 8/20 > mild edema.  CULTURES: Blood 8/21 >   ANTIBIOTICS: Fluconazole 8/21>>>  SIGNIFICANT EVENTS: 8/20 > admit.  LINES/TUBES: ETT 8/20 >  R IJ CVL 8/20 >  L IJ HD cath 8/20 >   DISCUSSION: 66 y.o. female with acute on chronic renal failure (seen by nephrology in the past however deemed to be a poor candidate for long-term dialysis due to cardiac function). Admitted 8/20 after fall at home. While in CT scan, developed brief cardiac arrest (30 seconds). Found to have worsening renal failure, acidosis, hypokalemia; therefore, nephrology consultation and planning for CRRT.  Patient intubated following brief code.  ASSESSMENT / PLAN:  PULMONARY A: Respiratory insufficiency. Hx OSA - not on CPAP. P:   Full vent support given neuro status Wean as able. VAP prevention measures. CXR and ABG in AM  CARDIOVASCULAR A:  Shock - suspect cardiogenic.  No history to suspect septic.  Brief cardiac arrest - presumed PEA due to hypokalemia and setting of worsening renal function. Troponin leak - demand. Hx CHF (Echo from 7/18 with EF 15-20%, G2DD, PAP 63), HLD, CAD. P:  Continue levophed, epi. Assess CVP, cortisol 63.4. CRRT per nephrology. May need cards consult. Continue preadmission ASA. Hold preadmission carvedilol, statin, enalapril, furosemide, nitro.  RENAL A:   AoCKD - seen by nephrology in the past and deemed to be a poor candidate for long-term dialysis due  to cardiac function. Hyponatremia. Hypokalemia. Hypocalcemia. P:   Nephrology consult appreciated BMP in AM. Replace electrolytes as indicated CRRT even per renal  GASTROINTESTINAL A:   Morbid obesity. GI prophylaxis. Nutrition. P:   SUP: Pantoprazole. NPO. Consult nutrition for TF as per nutrition  HEMATOLOGIC A:   VTE Prophylaxis. P:  SCD's / heparin. CBC in AM.  INFECTIOUS A:   Candida intertrigo. No indication for acute infection. P:   Nystatin powder to pannus. Fluconazole systemic for yeast infection in the pelvic region that is difficult to approach given anatomy Follow cultures as above.  Assess PCT 0.19, no empiric abx  ENDOCRINE A:   Hx DM. P:   SSI.  NEUROLOGIC A:   Acute encephalopathy. Hx  CVA, depression. P:   Sedation:  Fentanyl PRN / Midazolam PRN. RASS goal: 0 to -1. Daily WUA.  Family updated: No family bedside  Interdisciplinary Family Meeting v Palliative Care Meeting:  Due by: 12/10/16.  The patient is critically ill with multiple organ systems failure and requires high complexity decision making for assessment and support, frequent evaluation and titration of therapies, application of advanced monitoring technologies and extensive interpretation of multiple databases.   Critical Care Time devoted to patient care services described in this note is  45  Minutes. This time reflects time of care of this signee Dr Koren Bound. This critical care time does not reflect procedure time, or teaching time or supervisory time of PA/NP/Med student/Med Resident etc but could involve care discussion time.  Alyson Reedy, M.D. [facility] Pulmonary/Critical Care Medicine. Pager: [phone]. After hours pager: [phone].  12/04/2016, 10:00 AM

## 2016-12-04 NOTE — H&P (Signed)
PULMONARY / CRITICAL CARE MEDICINE   Name: [name] MRN: [id number] DOB: [birth year]    ADMISSION DATE:  12/13/2016 CONSULTATION DATE:  11/30/2016  REFERRING MD:  Hyacinth Meeker  CHIEF COMPLAINT:  Fall  HISTORY OF PRESENT ILLNESS:  Pt is encephelopathic; therefore, this HPI is obtained from chart review. [name] is a 66 y.o. female with PMH as outlined below. He presented to [facility] ED on 8/20 after she was found down on the floor of a home by her daughter. She apparently was normal earlier that morning when daughter left for work. When daughter returned home from work around 5 PM, patient informed daughter that she had fallen onto the floor around 9 or 10 AM and had been unable to get up for the whole day.  On arrival to ED, she was hypotensive and bradycardic. She was taken for CT scan of the head and while in CT, she became cyanotic and unresponsive. Shortly thereafter, she was noted to be pulseless. ACLS was started and ROSC was achieved within 30 seconds. She was intubated during this timeframe.  She was later found to have metabolic acidosis along with hypokalemia and acute on chronic renal failure (she had apparently been seen by nephrology in the past; however, was deemed to be a poor candidate for long-term dialysis due to her cardiac function). She was also previously listed as DO NOT RESUSCITATE; however, daughter states that the only reason for this was because patient would never one to be placed on machines for a long time period. She would however be okay with short-term intubation and ACLS if needed. She is also okay with short-term dialysis for acute issues.  She was evaluated by nephrology who opted to begin CRRT.    PAST MEDICAL HISTORY :  She  has a past medical history of CHF (congestive heart failure) (HCC) and Renal disorder.  PAST SURGICAL HISTORY: She  has no past surgical history on file.  Allergies  Allergen Reactions  . [place]ra Hives and Rash  .  Torsemide Rash    No current facility-administered medications on file prior to encounter.    No current outpatient prescriptions on file prior to encounter.    FAMILY HISTORY:  Her has no family status information on file.    SOCIAL HISTORY: She  reports that she does not drink alcohol or use drugs.  REVIEW OF SYSTEMS:  Unable to obtain as patient is encephalopathic.  SUBJECTIVE: On vent, unresponsive.  VITAL SIGNS: BP (!) 95/44   Pulse 94   Temp (!) 95.9 F (35.5 C) (Temporal)   Resp 11   Ht 5\' 2"  (1.575 m)   Wt 111.6 kg (246 lb)   SpO2 (!) 76%   BMI 44.99 kg/m   HEMODYNAMICS:    VENTILATOR SETTINGS: Vent Mode: PRVC FiO2 (%):  [100 %] 100 % Set Rate:  [18 bmp] 18 bmp Vt Set:  [400 mL] 400 mL PEEP:  [10 cmH20] 10 cmH20 Plateau Pressure:  [18 cmH20-27 cmH20] 18 cmH20  INTAKE / OUTPUT: No intake/output data recorded.   PHYSICAL EXAMINATION: General: Chronically ill-appearing female, obese, critically ill. Neuro: Sedated, unresponsive. HEENT: [place]/AT. PERRL, sclerae anicteric. Cardiovascular: RRR, no M/R/G.  Lungs: Respirations even and unlabored.  CTA bilaterally, No W/R/R. Abdomen: Obese, soft, NT/ND. Skin breakdown noted to pannus with erythema, mainly on the left. Musculoskeletal: No gross deformities, no edema.  Skin: Intact, warm, no rashes. Skin breakdown as above.  LABS:  BMET  Recent Labs Lab 12/09/2016 1858 11/18/2016 1917  NA  132* 130*  K 5.6* 5.4*  CL 98* 100*  CO2 13*  --   BUN 165* >140*  CREATININE 10.30* 10.40*  GLUCOSE 74 66    Electrolytes  Recent Labs Lab 12-05-2016 1858  CALCIUM 10.0    CBC  Recent Labs Lab 2016-12-05 1858 2016/12/05 1917  WBC 9.3  --   HGB 11.5* 13.3  HCT 35.1* 39.0  PLT 131*  --     Coag's  Recent Labs Lab 12/05/16 1858  APTT 38*  INR 2.20    Sepsis Markers  Recent Labs Lab December 05, 2016 1917  LATICACIDVEN 4.54*    ABG  Recent Labs Lab 2016-12-05 2006  PHART 7.149*  PCO2ART 30.9*   PO2ART 95.0    Liver Enzymes  Recent Labs Lab 12/05/16 1858  AST 328*  ALT 186*  ALKPHOS 45  BILITOT 1.5*  ALBUMIN 3.5    Cardiac Enzymes No results for input(s): TROPONINI, PROBNP in the last 168 hours.  Glucose  Recent Labs Lab 12-05-16 1925 Dec 05, 2016 2116 12-05-16 2207  GLUCAP 69 126* 171*    Imaging Ct Head Wo Contrast  Result Date: 2016/12/05 CLINICAL DATA:  Altered level of consciousness after fall today. EXAM: CT HEAD WITHOUT CONTRAST TECHNIQUE: Contiguous axial images were obtained from the base of the skull through the vertex without intravenous contrast. COMPARISON:  None. FINDINGS: Brain: Mild diffuse cortical atrophy is noted. Mild chronic ischemic white matter disease is noted. No mass effect or midline shift is noted. Ventricular size is within normal limits. There is no evidence of mass lesion, hemorrhage or acute infarction. Vascular: No hyperdense vessel or unexpected calcification. Skull: Normal. Negative for fracture or focal lesion. Sinuses/Orbits: No acute finding. Other: None. IMPRESSION: Mild diffuse cortical atrophy. Mild chronic ischemic white matter disease. No acute intracranial abnormality seen. Electronically Signed   By: Lupita Raider, M.D.   On: 12-05-2016 20:49   Dg Chest Portable 1 View  Result Date: 2016-12-05 CLINICAL DATA:  66 y/o  F; endotracheal tube advancement. EXAM: PORTABLE CHEST 1 VIEW COMPARISON:  2016/12/05 chest radiograph FINDINGS: The stable enlarged cardiac silhouette. Transcutaneous pacing pads noted. Enteric tube tip is coiling in the stomach with tip below field of view. Stable diffuse hazy opacification of lungs. Post median sternotomy with wires aligned. No acute osseous abnormality is evident. No pneumothorax. Possible small left effusion. Right central venous catheter stable in position with tip projecting over lower SVC. The endotracheal tube is been advanced with tip projecting 6.2 cm from the carina. IMPRESSION: 1.  Endotracheal tube tip projects 6.2 cm from carina, advanced from prior study. 2. Stable diffuse haziness of the lungs, possible small left effusion. 3. Stable enlarged cardiac silhouette. Electronically Signed   By: Mitzi Hansen M.D.   On: 05-Dec-2016 22:14   Dg Chest Portable 1 View  Result Date: 2016-12-05 CLINICAL DATA:  Central line placement. EXAM: PORTABLE CHEST 1 VIEW COMPARISON:  2016/12/05 FINDINGS: An ET tube has been placed in the interval terminating near the thoracic inlet. The patient may benefit from advancing 3 cm. NG tube terminates in the stomach. The right IJ terminates near the caval atrial junction. No pneumothorax. Transcutaneous pacers are noted. Cardiomegaly. No nodules or masses. No focal infiltrates. IMPRESSION: 1. An ET tube has been placed in the interval, terminating near the thoracic inlet. Recommend advancing 3 cm. 2. Other support apparatus as above. 3. No other changes. The findings are being called to the emergency room physician. Electronically Signed   By: Onalee Hua  Judithe Modest M.D   On: 2016/12/29 21:12   Dg Chest Portable 1 View  Result Date: December 29, 2016 CLINICAL DATA:  Fall and hypotension EXAM: PORTABLE CHEST 1 VIEW COMPARISON:  None. FINDINGS: There is cardiomegaly. Shallow lung inflation. No focal consolidation or overt pulmonary edema. No sizable pleural effusion or pneumothorax. IMPRESSION: Shallow lung inflation and cardiomegaly without overt edema. Electronically Signed   By: Deatra Robinson M.D.   On: 12/29/2016 19:31     STUDIES:  CT head 8/20 > no acute process. CXR 8/20 > mild edema.  CULTURES: Blood 8/21 >   ANTIBIOTICS: None.  SIGNIFICANT EVENTS: 8/20 > admit.  LINES/TUBES: ETT 8/20 >  R IJ CVL 8/20 >  L IJ HD cath 8/20 >   DISCUSSION: 66 y.o. female with acute on chronic renal failure (seen by nephrology in the past however deemed to be a poor candidate for long-term dialysis due to cardiac function). Admitted 8/20 after  fall at home. While in CT scan, developed brief cardiac arrest (30 seconds). Found to have worsening renal failure, acidosis, hypokalemia; therefore, nephrology consultation and planning for CRRT.  Patient intubated following brief code.  ASSESSMENT / PLAN:  PULMONARY A: Respiratory insufficiency. Hx OSA - not on CPAP. P:   Full vent support. Wean as able. VAP prevention measures. SBT in AM if able. CXR in AM.  CARDIOVASCULAR A:  Shock - suspect cardiogenic.  No history to suspect septic.  Brief cardiac arrest - presumed PEA due to hypokalemia and setting of worsening renal function. Troponin leak - demand. Hx CHF (Echo from 7/18 with EF 15-20%, G2DD, PAP 63), HLD, CAD. P:  Continue levophed, epi. Assess CVP, cortisol. Start CRRT tonight. May need cards consult. Trend troponin, lactate. Continue preadmission ASA, simvastatin. Hold preadmission carvedilol, enalapril, furosemide, nitro.  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 Hyponatremia. Hypokalemia. Hypocalcemia. P:   Nephrology consulted, opting for CRRT now. 1g Ca gluconate. BMP in AM.  GASTROINTESTINAL A:   Morbid obesity. GI prophylaxis. Nutrition. P:   SUP: Pantoprazole. NPO.  HEMATOLOGIC A:   VTE Prophylaxis. P:  SCD's / heparin. CBC in AM.  INFECTIOUS A:   Candida intertrigo. No indication for acute infection. P:   Nystatin powder to pannus. Follow cultures as above.  Assess PCT - if high, start empiric abx.  ENDOCRINE A:   Hx DM. P:   SSI.  NEUROLOGIC A:   Acute encephalopathy. Hx CVA, depression. P:   Sedation:  Fentanyl PRN / Midazolam PRN. RASS goal: 0 to -1. Daily WUA.  Family updated: Daughter updated.  Interdisciplinary Family Meeting v Palliative Care Meeting:  Due by: 12/10/16.  CC time: 35 min.   Rutherford Guys, [place] [facility] Pager:  [phone]  or [phone] 12/04/2016, 12:42 AM

## 2016-12-04 NOTE — Progress Notes (Signed)
Pharmacy Antibiotic Note  [name] is a 66 y.o. female admitted on 27-Dec-2016 with acute on chronic renal failure. Pharmacy has been consulted for fluconazole dosing. Patient has a pelvic yeast infection with difficulty reaching site due to body habitus and fluconazole is being added for systemic coverage in addition to nystatin powder. Patient is also currently on CRRT and fluconazole will be adjusted accordingly.   Plan: -Fluconazole 400mg  IV q24 hours  -continue to monitor for pauses and interruptions in CRRT    Height: 5\' 2"  (157.5 cm) Weight: 264 lb 15.9 oz (120.2 kg) IBW/kg (Calculated) : 50.1  Temp (24hrs), Avg:94.5 F (34.7 C), Min:90 F (32.2 C), Max:96.1 F (35.6 C)   Recent Labs Lab 12/27/2016 1858 27-Dec-2016 1917 12/04/16 0405 12/04/16 0530  WBC 9.3  --   --  19.0*  CREATININE 10.30* 10.40* 8.99* 8.43*  LATICACIDVEN  --  4.54* 4.6*  --     Estimated Creatinine Clearance: 8 mL/min (A) (by C-G formula based on SCr of 8.43 mg/dL (H)).    Allergies  Allergen Reactions  . [place] Hives and Rash  . Torsemide Rash    Antimicrobials this admission: Fluconazole 8/21>>  Thank you for allowing pharmacy to be a part of this patient's care.  Blake Divine, Pharm.D. PGY1 Pharmacy Resident 12/04/2016 10:37 AM Main Pharmacy: [phone]

## 2016-12-05 ENCOUNTER — Inpatient Hospital Stay (HOSPITAL_COMMUNITY): Payer: Medicare Other

## 2016-12-05 ENCOUNTER — Encounter (HOSPITAL_COMMUNITY): Payer: Self-pay | Admitting: General Practice

## 2016-12-05 DIAGNOSIS — N179 Acute kidney failure, unspecified: Secondary | ICD-10-CM

## 2016-12-05 DIAGNOSIS — G9340 Encephalopathy, unspecified: Secondary | ICD-10-CM

## 2016-12-05 DIAGNOSIS — R570 Cardiogenic shock: Secondary | ICD-10-CM

## 2016-12-05 DIAGNOSIS — J9601 Acute respiratory failure with hypoxia: Secondary | ICD-10-CM

## 2016-12-05 LAB — RENAL FUNCTION PANEL
ALBUMIN: 3 g/dL — AB (ref 3.5–5.0)
Albumin: 2.6 g/dL — ABNORMAL LOW (ref 3.5–5.0)
Anion gap: 12 (ref 5–15)
Anion gap: 8 (ref 5–15)
BUN: 62 mg/dL — ABNORMAL HIGH (ref 6–20)
BUN: 40 mg/dL — AB (ref 6–20)
CALCIUM: 7.8 mg/dL — AB (ref 8.9–10.3)
CHLORIDE: 95 mmol/L — AB (ref 101–111)
CHLORIDE: 101 mmol/L (ref 101–111)
CO2: 28 mmol/L (ref 22–32)
CO2: 27 mmol/L (ref 22–32)
CREATININE: 2.82 mg/dL — AB (ref 0.44–1.00)
Calcium: 8 mg/dL — ABNORMAL LOW (ref 8.9–10.3)
Creatinine, Ser: 3.97 mg/dL — ABNORMAL HIGH (ref 0.44–1.00)
GFR calc Af Amer: 19 mL/min — ABNORMAL LOW (ref >60)
GFR calc non Af Amer: 16 mL/min — ABNORMAL LOW (ref >60)
GFR, EST AFRICAN AMERICAN: 12 mL/min — AB (ref >60)
GFR, EST NON AFRICAN AMERICAN: 11 mL/min — AB (ref >60)
Glucose, Bld: 162 mg/dL — ABNORMAL HIGH (ref 65–99)
Glucose, Bld: 119 mg/dL — ABNORMAL HIGH (ref 65–99)
PHOSPHORUS: 4.1 mg/dL (ref 2.5–4.6)
POTASSIUM: 3.7 mmol/L (ref 3.5–5.1)
Phosphorus: 2.5 mg/dL (ref 2.5–4.6)
Potassium: 3.6 mmol/L (ref 3.5–5.1)
Sodium: 135 mmol/L (ref 135–145)
Sodium: 136 mmol/L (ref 135–145)

## 2016-12-05 LAB — BLOOD GAS, ARTERIAL
Acid-Base Excess: 5.1 mmol/L — ABNORMAL HIGH (ref 0.0–2.0)
BICARBONATE: 28.7 mmol/L — AB (ref 20.0–28.0)
Drawn by: 252031
FIO2: 50
LHR: 30 {breaths}/min
O2 SAT: 99 %
PATIENT TEMPERATURE: 98.6
PCO2 ART: 38.7 mmHg (ref 32.0–48.0)
PEEP/CPAP: 10 cmH2O
Pressure control: 20 cmH2O
pH, Arterial: 7.482 — ABNORMAL HIGH (ref 7.350–7.450)
pO2, Arterial: 138 mmHg — ABNORMAL HIGH (ref 83.0–108.0)

## 2016-12-05 LAB — MAGNESIUM: MAGNESIUM: 2.2 mg/dL (ref 1.7–2.4)

## 2016-12-05 LAB — GLUCOSE, CAPILLARY
GLUCOSE-CAPILLARY: 116 mg/dL — AB (ref 65–99)
GLUCOSE-CAPILLARY: 146 mg/dL — AB (ref 65–99)
GLUCOSE-CAPILLARY: 176 mg/dL — AB (ref 65–99)
Glucose-Capillary: 130 mg/dL — ABNORMAL HIGH (ref 65–99)
Glucose-Capillary: 122 mg/dL — ABNORMAL HIGH (ref 65–99)

## 2016-12-05 LAB — POCT ACTIVATED CLOTTING TIME
ACTIVATED CLOTTING TIME: 147 s
ACTIVATED CLOTTING TIME: 180 s
ACTIVATED CLOTTING TIME: 169 s
ACTIVATED CLOTTING TIME: 175 s
ACTIVATED CLOTTING TIME: 175 s
ACTIVATED CLOTTING TIME: 180 s
ACTIVATED CLOTTING TIME: 186 s
ACTIVATED CLOTTING TIME: 202 s
Activated Clotting Time: 147 seconds
Activated Clotting Time: 180 seconds
Activated Clotting Time: 186 seconds
Activated Clotting Time: 197 seconds

## 2016-12-05 LAB — PROCALCITONIN: PROCALCITONIN: 1.06 ng/mL

## 2016-12-05 LAB — CBC
HCT: 33 % — ABNORMAL LOW (ref 36.0–46.0)
HEMOGLOBIN: 11 g/dL — AB (ref 12.0–15.0)
MCH: 30.3 pg (ref 26.0–34.0)
MCHC: 33.3 g/dL (ref 30.0–36.0)
MCV: 90.9 fL (ref 78.0–100.0)
PLATELETS: 132 10*3/uL — AB (ref 150–400)
RBC: 3.63 MIL/uL — AB (ref 3.87–5.11)
RDW: 16.6 % — AB (ref 11.5–15.5)
WBC: 11.5 10*3/uL — ABNORMAL HIGH (ref 4.0–10.5)

## 2016-12-05 MED ORDER — NOREPINEPHRINE BITARTRATE 1 MG/ML IV SOLN
0.0000 ug/min | INTRAVENOUS | Status: DC
  Administered 2016-12-05: 10 ug/min via INTRAVENOUS
  Administered 2016-12-06: 12 ug/min via INTRAVENOUS
  Administered 2016-12-07 (×2): 40 ug/min via INTRAVENOUS
  Filled 2016-12-05 (×5): qty 16

## 2016-12-05 MED ORDER — HEPARIN BOLUS VIA INFUSION (CRRT)
1000.0000 [IU] | INTRAVENOUS | Status: DC | PRN
  Filled 2016-12-05: qty 1000

## 2016-12-05 MED ORDER — HEPARIN SODIUM (PORCINE) 1000 UNIT/ML DIALYSIS
1000.0000 [IU] | INTRAMUSCULAR | Status: DC | PRN
  Filled 2016-12-05: qty 6

## 2016-12-05 MED ORDER — SODIUM CHLORIDE 0.9 % IJ SOLN
250.0000 [IU]/h | INTRAMUSCULAR | Status: DC
  Administered 2016-12-05: 1000 [IU]/h via INTRAVENOUS_CENTRAL
  Administered 2016-12-05: 950 [IU]/h via INTRAVENOUS_CENTRAL
  Administered 2016-12-05: 250 [IU]/h via INTRAVENOUS_CENTRAL
  Administered 2016-12-06: 1000 [IU]/h via INTRAVENOUS_CENTRAL
  Administered 2016-12-06 – 2016-12-07 (×2): 1050 [IU]/h via INTRAVENOUS_CENTRAL
  Filled 2016-12-05 (×7): qty 2

## 2016-12-05 MED ORDER — PRISMASOL BGK 4/2.5 32-4-2.5 MEQ/L IV SOLN
INTRAVENOUS | Status: DC
  Administered 2016-12-05 – 2016-12-07 (×9): via INTRAVENOUS_CENTRAL
  Filled 2016-12-05 (×13): qty 5000

## 2016-12-05 MED ORDER — PRISMASOL BGK 4/2.5 32-4-2.5 MEQ/L IV SOLN
INTRAVENOUS | Status: DC
  Administered 2016-12-05 – 2016-12-07 (×7): via INTRAVENOUS_CENTRAL
  Filled 2016-12-05 (×11): qty 5000

## 2016-12-05 MED ORDER — PRISMASOL BGK 4/2.5 32-4-2.5 MEQ/L IV SOLN
INTRAVENOUS | Status: DC
  Administered 2016-12-05 – 2016-12-07 (×19): via INTRAVENOUS_CENTRAL
  Filled 2016-12-05 (×29): qty 5000

## 2016-12-05 MED ORDER — SODIUM CHLORIDE 0.9 % FOR CRRT
INTRAVENOUS_CENTRAL | Status: DC | PRN
  Filled 2016-12-05: qty 1000

## 2016-12-05 MED ORDER — NOREPINEPHRINE BITARTRATE 1 MG/ML IV SOLN: 0.0000 ug/min | INTRAVENOUS | Status: DC

## 2016-12-05 MED FILL — Medication: Qty: 1 | Status: AC

## 2016-12-05 NOTE — Progress Notes (Signed)
PULMONARY / CRITICAL CARE MEDICINE   Name: [name] MRN: [id number] DOB: 05-25-1950    ADMISSION DATE:  11/22/2016 CONSULTATION DATE:  11/21/2016  REFERRING MD:  Hyacinth Meeker  CHIEF COMPLAINT:  Fall  HISTORY OF PRESENT ILLNESS:  Pt is encephelopathic; therefore, this HPI is obtained from chart review. [name] is a 66 y.o. female with PMH as outlined below. He presented to [facility] ED on 8/20 after she was found down on the floor of a home by her daughter. She apparently was normal earlier that morning when daughter left for work. When daughter returned home from work around 5 PM, patient informed daughter that she had fallen onto the floor around 9 or 10 AM and had been unable to get up for the whole day.  On arrival to ED, she was hypotensive and bradycardic. She was taken for CT scan of the head and while in CT, she became cyanotic and unresponsive. Shortly thereafter, she was noted to be pulseless. ACLS was started and ROSC was achieved within 30 seconds. She was intubated during this timeframe.  She was later found to have metabolic acidosis along with hypokalemia and acute on chronic renal failure (she had apparently been seen by nephrology in the past; however, was deemed to be a poor candidate for long-term dialysis due to her cardiac function). She was also previously listed as DO NOT RESUSCITATE; however, daughter states that the only reason for this was because patient would never one to be placed on machines for a long time period. She would however be okay with short-term intubation and ACLS if needed. She is also okay with short-term dialysis for acute issues.  She was evaluated by nephrology who opted to begin CRRT.   SUBJECTIVE: On vent, withdraws to pain  VITAL SIGNS: BP (!) 107/39   Pulse 72   Temp (!) 96.8 F (36 C)   Resp (!) 31   Ht 5\' 2"  (1.575 m)   Wt 118.8 kg (261 lb 14.5 oz)   SpO2 97%   BMI 47.90 kg/m   HEMODYNAMICS: CVP:  [22 mmHg-26 mmHg] 22  mmHg  VENTILATOR SETTINGS: Vent Mode: PCV FiO2 (%):  [40 %-50 %] 40 % Set Rate:  [30 bmp] 30 bmp Vt Set:  [400 mL] 400 mL PEEP:  [10 cmH20] 10 cmH20 Plateau Pressure:  [19 cmH20-24 cmH20] 19 cmH20  INTAKE / OUTPUT: I/O last 3 completed shifts: In: 5443.1 [I.V.:3413.1; NG/GT:470; IV Piggyback:1560] Out: 3126 [Urine:45; Other:3081]  PHYSICAL EXAMINATION: General: Chronically ill-appearing female, obese, critically ill. Neuro: Sedated, withdraws to pain HEENT: [place]/AT. PERRL, sclerae anicteric. Cardiovascular: RRR, no M/R/G.  Lungs: Diffuse crackles Abdomen: Obese, soft, NT/ND. Skin breakdown noted to pannus with erythema, mainly on the left. Musculoskeletal: No gross deformities, no edema.  Skin: Intact, warm, no rashes. Skin breakdown as above.  LABS:  BMET  Recent Labs Lab 12/04/16 0530 12/04/16 1609 12/05/16 0403  NA 131* 133* 135  K 4.5 4.1 3.7  CL 100* 98* 95*  CO2 12* 21* 28  BUN 148* 96* 62*  CREATININE 8.43* 5.87* 3.97*  GLUCOSE 189* 150* 162*   Electrolytes  Recent Labs Lab 12/04/16 0530 12/04/16 1609 12/05/16 0403  CALCIUM 9.1 8.3* 8.0*  MG 2.5*  --  2.2  PHOS 10.0* 6.2* 4.1   CBC  Recent Labs Lab 11/18/2016 1858 11/30/2016 1917 12/04/16 0530 12/05/16 0403  WBC 9.3  --  19.0* 11.5*  HGB 11.5* 13.3 12.1 11.0*  HCT 35.1* 39.0 36.7 33.0*  PLT 131*  --  162 132*    Coag's  Recent Labs Lab 11/27/2016 1858  APTT 38*  INR 2.20    Sepsis Markers  Recent Labs Lab 11/24/2016 1917 12/04/16 0405 12/05/16 0403  LATICACIDVEN 4.54* 4.6*  --   PROCALCITON  --  0.19 1.06    ABG  Recent Labs Lab 12/04/16 0237 12/04/16 0456 12/05/16 0435  PHART 7.173* 7.242* 7.482*  PCO2ART 25.0* 25.1* 38.7  PO2ART 110.0* 160* 138*    Liver Enzymes  Recent Labs Lab 11/24/2016 1858 12/04/16 0405 12/04/16 1609 12/05/16 0403  AST 328*  --   --   --   ALT 186*  --   --   --   ALKPHOS 45  --   --   --   BILITOT 1.5*  --   --   --   ALBUMIN 3.5 3.3*  3.1* 3.0*    Cardiac Enzymes  Recent Labs Lab 12/04/16 0405 12/04/16 0530  TROPONINI 2.95* 2.97*    Glucose  Recent Labs Lab 12/04/16 1525 12/04/16 2022 12/04/16 2332 12/05/16 0419 12/05/16 0829 12/05/16 1129  GLUCAP 159* 107* 149* 176* 146* 130*    Imaging Dg Chest Port 1 View  Result Date: 12/05/2016 CLINICAL DATA:  Intubation. EXAM: PORTABLE CHEST 1 VIEW COMPARISON:  12/04/2016. FINDINGS: Endotracheal tube, NG tube, right IJ line stable position. Prior CABG. Cardiomegaly. Persistent basilar atelectasis. Rounded density noted over the right mid lung consistent with focal infiltrate/ edema versus fluid pseudotumor. No pneumothorax. IMPRESSION: 1. Lines and tubes in stable position. 2. Persistent basilar atelectasis. Rounded density noted over the right mid lung consistent with focal infiltrate/edema versus fluid pseudotumor. Prior CABG. Stable cardiomegaly. Electronically Signed   By: Maisie Fus  Register   On: 12/05/2016 06:49     STUDIES:  CT head 8/20 > no acute process. CXR 8/20 > mild edema.  CULTURES: Blood 8/21 >   ANTIBIOTICS: Fluconazole 8/21>>>8/25  SIGNIFICANT EVENTS: 8/20 > admit.  LINES/TUBES: ETT 8/20 >  R IJ CVL 8/20 >  L IJ HD cath 8/20 >   DISCUSSION: 66 y.o. female with acute on chronic renal failure (seen by nephrology in the past however deemed to be a poor candidate for long-term dialysis due to cardiac function). Admitted 8/20 after fall at home. While in CT scan, developed brief cardiac arrest (30 seconds). Found to have worsening renal failure, acidosis, hypokalemia; therefore, nephrology consultation and planning for CRRT.  Patient intubated following brief code.  ASSESSMENT / PLAN:  PULMONARY A: Respiratory insufficiency. Hx OSA - not on CPAP. P:   Begin PS trials but no extubation given fluid overload VAP prevention measures. CXR and ABG in AM  CARDIOVASCULAR A:  Shock - suspect cardiogenic.  No history to suspect septic.   Brief cardiac arrest - presumed PEA due to hypokalemia and setting of worsening renal function. Troponin leak - demand. Hx CHF (Echo from 7/18 with EF 15-20%, G2DD, PAP 63), HLD, CAD. P:  Change epi to norepi Assess CVP 22, cortisol 63.4. CRRT per nephrology -25 ml/hr May need cards consult if continues to be in shock in AM. Continue preadmission ASA. Hold preadmission carvedilol, statin, enalapril, furosemide, nitro.  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 Hyponatremia. Hypokalemia. Hypocalcemia. P:   Nephrology consult appreciated BMP in AM. Replace electrolytes as indicated CRRT -25 ml/hr  GASTROINTESTINAL A:   Morbid obesity. GI prophylaxis. Nutrition. P:   SUP: Pantoprazole. NPO.  TF per nutrition  HEMATOLOGIC A:   VTE Prophylaxis. P:  SCD's / heparin. CBC in AM.  INFECTIOUS A:   Candida intertrigo. No indication for acute infection. P:   Nystatin powder to pannus. Fluconazole systemic for yeast infection in the pelvic region that is difficult to approach given anatomy will place stop date for 5 days Follow cultures as above.  Assess PCT 0.19, no empiric abx  ENDOCRINE A:   Hx DM. P:   SSI.  NEUROLOGIC A:   Acute encephalopathy. Hx CVA, depression. P:   Sedation:  Fentanyl PRN / Midazolam PRN. RASS goal: 0 to -1. Daily WUA.  Family updated: Wife updated over the phone, all questions answered  Interdisciplinary Family Meeting v Palliative Care Meeting:  Due by: 12/10/16.  The patient is critically ill with multiple organ systems failure and requires high complexity decision making for assessment and support, frequent evaluation and titration of therapies, application of advanced monitoring technologies and extensive interpretation of multiple databases.   Critical Care Time devoted to patient care services described in this note is  45  Minutes. This  time reflects time of care of this signee Dr Koren Bound. This critical care time does not reflect procedure time, or teaching time or supervisory time of PA/NP/Med student/Med Resident etc but could involve care discussion time.  Alyson Reedy, M.D. [facility] Pulmonary/Critical Care Medicine. Pager: [phone]. After hours pager: [phone].  12/05/2016, 12:00 PM

## 2016-12-05 NOTE — Progress Notes (Signed)
Subjective: Interval History: on vent, opens eyes to verbal stim,  Responds to physical stim  Objective: Vital signs in last 24 hours: Temp:  [94.8 F (34.9 C)-98.4 F (36.9 C)] 97.3 F (36.3 C) (08/22 0400) Pulse Rate:  [62-81] 74 (08/22 0715) Resp:  [0-37] 14 (08/22 0715) BP: (43-110)/(31-50) 110/50 (08/21 1532) SpO2:  [90 %-100 %] 100 % (08/22 0715) FiO2 (%):  [50 %] 50 % (08/22 0315) Weight:  [118.8 kg (261 lb 14.5 oz)] 118.8 kg (261 lb 14.5 oz) (08/22 0449) Weight change: 7.215 kg (15 lb 14.5 oz)  Intake/Output from previous day: 08/21 0701 - 08/22 0700 In: 2604 [I.V.:1934; NG/GT:470; IV Piggyback:200] Out: 2530 [Urine:45] Intake/Output this shift: No intake/output data recorded.  General appearance: moderately obese, slowed mentation and on vent , opens eyes Neck: IJ cath Resp: rales bibasilar and rhonchi bibasilar Cardio: S1, S2 normal and systolic murmur: holosystolic 2/6, blowing at apex GI: obese,striae, pos bs, soft Extremities: edema 3+  Lab Results:  Recent Labs  12/04/16 0530 12/05/16 0403  WBC 19.0* 11.5*  HGB 12.1 11.0*  HCT 36.7 33.0*  PLT 162 132*   BMET:  Recent Labs  12/04/16 1609 12/05/16 0403  NA 133* 135  K 4.1 3.7  CL 98* 95*  CO2 21* 28  GLUCOSE 150* 162*  BUN 96* 62*  CREATININE 5.87* 3.97*  CALCIUM 8.3* 8.0*   No results for input(s): PTH in the last 72 hours. Iron Studies: No results for input(s): IRON, TIBC, TRANSFERRIN, FERRITIN in the last 72 hours.  Studies/Results: Ct Head Wo Contrast  Result Date: 12/05/2016 CLINICAL DATA:  Altered level of consciousness after fall today. EXAM: CT HEAD WITHOUT CONTRAST TECHNIQUE: Contiguous axial images were obtained from the base of the skull through the vertex without intravenous contrast. COMPARISON:  None. FINDINGS: Brain: Mild diffuse cortical atrophy is noted. Mild chronic ischemic white matter disease is noted. No mass effect or midline shift is noted. Ventricular size is within  normal limits. There is no evidence of mass lesion, hemorrhage or acute infarction. Vascular: No hyperdense vessel or unexpected calcification. Skull: Normal. Negative for fracture or focal lesion. Sinuses/Orbits: No acute finding. Other: None. IMPRESSION: Mild diffuse cortical atrophy. Mild chronic ischemic white matter disease. No acute intracranial abnormality seen. Electronically Signed   By: Lupita Raider, M.D.   On: 12/12/2016 20:49   Dg Chest Port 1 View  Result Date: 12/05/2016 CLINICAL DATA:  Intubation. EXAM: PORTABLE CHEST 1 VIEW COMPARISON:  12/04/2016. FINDINGS: Endotracheal tube, NG tube, right IJ line stable position. Prior CABG. Cardiomegaly. Persistent basilar atelectasis. Rounded density noted over the right mid lung consistent with focal infiltrate/ edema versus fluid pseudotumor. No pneumothorax. IMPRESSION: 1. Lines and tubes in stable position. 2. Persistent basilar atelectasis. Rounded density noted over the right mid lung consistent with focal infiltrate/edema versus fluid pseudotumor. Prior CABG. Stable cardiomegaly. Electronically Signed   By: Maisie Fus  Register   On: 12/05/2016 06:49   Dg Chest Port 1 View  Result Date: 12/04/2016 CLINICAL DATA:  Central line placement. EXAM: PORTABLE CHEST 1 VIEW COMPARISON:  Radiographs yesterday at 2137 hour FINDINGS: New left internal jugular central venous catheter in the region of the proximal SVC/ brachiocephalic confluence. No pneumothorax. Tip of the right internal jugular central venous catheter in the mid SVC. Endotracheal tube tip remains 6.1 cm from the carina. Enteric tube tip and side-port below the diaphragm in the stomach. Post median sternotomy. Stable cardiomegaly. Bibasilar atelectasis. Hazy lung opacity without convincing pulmonary edema. IMPRESSION:  1. New left internal jugular central venous catheter tip in the region of the proximal SVC/brachiocephalic confluence. No pneumothorax. 2. Right central line, endotracheal tube,  and enteric tube remain in place. 3. Stable cardiomegaly.  Bibasilar atelectasis. Electronically Signed   By: Rubye Oaks M.D.   On: 12/04/2016 02:19   Dg Chest Portable 1 View  Result Date: 11/21/2016 CLINICAL DATA:  66 y/o  F; endotracheal tube advancement. EXAM: PORTABLE CHEST 1 VIEW COMPARISON:  12/02/2016 chest radiograph FINDINGS: The stable enlarged cardiac silhouette. Transcutaneous pacing pads noted. Enteric tube tip is coiling in the stomach with tip below field of view. Stable diffuse hazy opacification of lungs. Post median sternotomy with wires aligned. No acute osseous abnormality is evident. No pneumothorax. Possible small left effusion. Right central venous catheter stable in position with tip projecting over lower SVC. The endotracheal tube is been advanced with tip projecting 6.2 cm from the carina. IMPRESSION: 1. Endotracheal tube tip projects 6.2 cm from carina, advanced from prior study. 2. Stable diffuse haziness of the lungs, possible small left effusion. 3. Stable enlarged cardiac silhouette. Electronically Signed   By: Mitzi Hansen M.D.   On: 12/02/2016 22:14   Dg Chest Portable 1 View  Result Date: 12/02/2016 CLINICAL DATA:  Central line placement. EXAM: PORTABLE CHEST 1 VIEW COMPARISON:  December 03, 2016 FINDINGS: An ET tube has been placed in the interval terminating near the thoracic inlet. The patient may benefit from advancing 3 cm. NG tube terminates in the stomach. The right IJ terminates near the caval atrial junction. No pneumothorax. Transcutaneous pacers are noted. Cardiomegaly. No nodules or masses. No focal infiltrates. IMPRESSION: 1. An ET tube has been placed in the interval, terminating near the thoracic inlet. Recommend advancing 3 cm. 2. Other support apparatus as above. 3. No other changes. The findings are being called to the emergency room physician. Electronically Signed   By: Gerome Sam III M.D   On: 11/26/2016 21:12   Dg Chest Portable  1 View  Result Date: 11/17/2016 CLINICAL DATA:  Fall and hypotension EXAM: PORTABLE CHEST 1 VIEW COMPARISON:  None. FINDINGS: There is cardiomegaly. Shallow lung inflation. No focal consolidation or overt pulmonary edema. No sizable pleural effusion or pneumothorax. IMPRESSION: Shallow lung inflation and cardiomegaly without overt edema. Electronically Signed   By: Deatra Robinson M.D.   On: 12/10/2016 19:31    I have reviewed the patient's current medications.  Assessment/Plan: 1 AKI/CKD 4 vol xs, improving solute, Acid/base corrected, will use all Prismasate, stop Na bicarb 2 Shock on 1 pressor now 3 VDRf per CCM  Improving O2 4 Arrest 5 DM 6 CVA,  7 Obesity 8 Anemia mild 9 CAD P CRRT, prismasate, slow vol off, AB, vEnt , TF    LOS: 2 days   Jeania Nater L 12/05/2016,7:22 AM

## 2016-12-06 ENCOUNTER — Inpatient Hospital Stay (HOSPITAL_COMMUNITY): Payer: Medicare Other

## 2016-12-06 DIAGNOSIS — I361 Nonrheumatic tricuspid (valve) insufficiency: Secondary | ICD-10-CM

## 2016-12-06 DIAGNOSIS — J810 Acute pulmonary edema: Secondary | ICD-10-CM

## 2016-12-06 DIAGNOSIS — Z515 Encounter for palliative care: Secondary | ICD-10-CM

## 2016-12-06 LAB — ECHOCARDIOGRAM COMPLETE
CHL CUP MV DEC (S): 261
CHL CUP RV SYS PRESS: 69 mmHg
CHL CUP STROKE VOLUME: 38 mL
E decel time: 261 msec
E/e' ratio: 13.99
FS: 5 % — AB (ref 28–44)
Height: 62 in
IVS/LV PW RATIO, ED: 0.86
LA diam end sys: 54 mm
LA vol index: 30.7 mL/m2
LA vol: 71.9 mL
LADIAMINDEX: 2.31 cm/m2
LASIZE: 54 mm
LAVOLA4C: 72.1 mL
LV E/e' medial: 13.99
LV PW d: 10.1 mm — AB (ref 0.6–1.1)
LV TDI E'LATERAL: 7.72
LV TDI E'MEDIAL: 3.7
LV dias vol index: 101 mL/m2
LV dias vol: 237 mL — AB (ref 46–106)
LVEEAVG: 13.99
LVELAT: 7.72 cm/s
LVOT SV: 52 mL
LVOT VTI: 18.3 cm
LVOT area: 2.84 cm2
LVOT peak vel: 103 cm/s
LVOTD: 19 mm
LVSYSVOL: 199 mL — AB (ref 14–42)
LVSYSVOLIN: 85 mL/m2
Lateral S' vel: 11.5 cm/s
MV Peak grad: 5 mmHg
MV pk E vel: 108 m/s
MVPKAVEL: 85.9 m/s
PISA EROA: 0.05 cm2
RV TAPSE: 16.8 mm
Reg peak vel: 369 cm/s
Simpson's disk: 16
TRMAXVEL: 369 cm/s
VTI: 151 cm
Weight: 4151.7 oz

## 2016-12-06 LAB — RENAL FUNCTION PANEL
ANION GAP: 7 (ref 5–15)
Albumin: 2.6 g/dL — ABNORMAL LOW (ref 3.5–5.0)
Albumin: 2.5 g/dL — ABNORMAL LOW (ref 3.5–5.0)
Anion gap: 6 (ref 5–15)
BUN: 30 mg/dL — ABNORMAL HIGH (ref 6–20)
BUN: 22 mg/dL — AB (ref 6–20)
CHLORIDE: 101 mmol/L (ref 101–111)
CHLORIDE: 104 mmol/L (ref 101–111)
CO2: 27 mmol/L (ref 22–32)
CO2: 26 mmol/L (ref 22–32)
Calcium: 7.8 mg/dL — ABNORMAL LOW (ref 8.9–10.3)
Calcium: 7.8 mg/dL — ABNORMAL LOW (ref 8.9–10.3)
Creatinine, Ser: 2.13 mg/dL — ABNORMAL HIGH (ref 0.44–1.00)
Creatinine, Ser: 1.62 mg/dL — ABNORMAL HIGH (ref 0.44–1.00)
GFR calc Af Amer: 37 mL/min — ABNORMAL LOW (ref >60)
GFR, EST AFRICAN AMERICAN: 26 mL/min — AB (ref >60)
GFR, EST NON AFRICAN AMERICAN: 23 mL/min — AB (ref >60)
GFR, EST NON AFRICAN AMERICAN: 32 mL/min — AB (ref >60)
Glucose, Bld: 132 mg/dL — ABNORMAL HIGH (ref 65–99)
Glucose, Bld: 85 mg/dL (ref 65–99)
PHOSPHORUS: 2.2 mg/dL — AB (ref 2.5–4.6)
POTASSIUM: 4.4 mmol/L (ref 3.5–5.1)
POTASSIUM: 3.7 mmol/L (ref 3.5–5.1)
Phosphorus: 1.9 mg/dL — ABNORMAL LOW (ref 2.5–4.6)
SODIUM: 134 mmol/L — AB (ref 135–145)
Sodium: 137 mmol/L (ref 135–145)

## 2016-12-06 LAB — CBC
HEMATOCRIT: 32.7 % — AB (ref 36.0–46.0)
Hemoglobin: 10.6 g/dL — ABNORMAL LOW (ref 12.0–15.0)
MCH: 29.8 pg (ref 26.0–34.0)
MCHC: 32.4 g/dL (ref 30.0–36.0)
MCV: 91.9 fL (ref 78.0–100.0)
Platelets: 140 10*3/uL — ABNORMAL LOW (ref 150–400)
RBC: 3.56 MIL/uL — ABNORMAL LOW (ref 3.87–5.11)
RDW: 16.8 % — AB (ref 11.5–15.5)
WBC: 9.6 10*3/uL (ref 4.0–10.5)

## 2016-12-06 LAB — POCT ACTIVATED CLOTTING TIME
ACTIVATED CLOTTING TIME: 186 s
ACTIVATED CLOTTING TIME: 191 s
ACTIVATED CLOTTING TIME: 191 s
ACTIVATED CLOTTING TIME: 191 s
ACTIVATED CLOTTING TIME: 202 s
ACTIVATED CLOTTING TIME: 202 s
ACTIVATED CLOTTING TIME: 208 s
Activated Clotting Time: 186 seconds
Activated Clotting Time: 191 seconds
Activated Clotting Time: 208 seconds

## 2016-12-06 LAB — GLUCOSE, CAPILLARY
GLUCOSE-CAPILLARY: 136 mg/dL — AB (ref 65–99)
GLUCOSE-CAPILLARY: 112 mg/dL — AB (ref 65–99)
Glucose-Capillary: 116 mg/dL — ABNORMAL HIGH (ref 65–99)
Glucose-Capillary: 133 mg/dL — ABNORMAL HIGH (ref 65–99)
Glucose-Capillary: 133 mg/dL — ABNORMAL HIGH (ref 65–99)
Glucose-Capillary: 89 mg/dL (ref 65–99)

## 2016-12-06 LAB — POCT I-STAT 3, ART BLOOD GAS (G3+)
Acid-Base Excess: 5 mmol/L — ABNORMAL HIGH (ref 0.0–2.0)
Bicarbonate: 28.1 mmol/L — ABNORMAL HIGH (ref 20.0–28.0)
O2 SAT: 99 %
PCO2 ART: 39 mmHg (ref 32.0–48.0)
PH ART: 7.471 — AB (ref 7.350–7.450)
Patient temperature: 101.6
TCO2: 29 mmol/L (ref 0–100)
pO2, Arterial: 145 mmHg — ABNORMAL HIGH (ref 83.0–108.0)

## 2016-12-06 LAB — BLOOD GAS, ARTERIAL
Acid-Base Excess: 3.6 mmol/L — ABNORMAL HIGH (ref 0.0–2.0)
BICARBONATE: 27.7 mmol/L (ref 20.0–28.0)
FIO2: 40
LHR: 30 {breaths}/min
O2 SAT: 98.8 %
PATIENT TEMPERATURE: 96.8
PCO2 ART: 40.5 mmHg (ref 32.0–48.0)
PEEP/CPAP: 5 cmH2O
PO2 ART: 111 mmHg — AB (ref 83.0–108.0)
PRESSURE CONTROL: 20 cmH2O
pH, Arterial: 7.444 (ref 7.350–7.450)

## 2016-12-06 LAB — APTT: aPTT: 143 seconds — ABNORMAL HIGH (ref 24–36)

## 2016-12-06 LAB — PROCALCITONIN: PROCALCITONIN: 0.98 ng/mL

## 2016-12-06 LAB — MAGNESIUM: Magnesium: 2.3 mg/dL (ref 1.7–2.4)

## 2016-12-06 MED ORDER — SODIUM CHLORIDE 0.9% FLUSH
10.0000 mL | Freq: Two times a day (BID) | INTRAVENOUS | Status: DC
  Administered 2016-12-06 – 2016-12-07 (×3): 10 mL

## 2016-12-06 MED ORDER — SODIUM CHLORIDE 0.9% FLUSH: 10.0000 mL | INTRAVENOUS | Status: DC | PRN

## 2016-12-06 MED ORDER — CHLORHEXIDINE GLUCONATE CLOTH 2 % EX PADS
6.0000 | MEDICATED_PAD | Freq: Every day | CUTANEOUS | Status: DC
  Administered 2016-12-06: 6 via TOPICAL

## 2016-12-06 MED ORDER — SODIUM PHOSPHATES 45 MMOLE/15ML IV SOLN
30.0000 mmol | Freq: Once | INTRAVENOUS | Status: AC
  Administered 2016-12-06: 30 mmol via INTRAVENOUS
  Filled 2016-12-06: qty 10

## 2016-12-06 NOTE — Care Management Note (Signed)
Case Management Note  Patient Details  Name: [name] MRN: [id number] Date of Birth: 03-25-1951  Subjective/Objective:     Pt admitted post fall - pt became unresponsive and pulseless in ED requiring ACLS and ventilation.  Pt is also on CRRT for acute on chronic kidney failure       Action/Plan:  PTA from home with family.  CM will continue to follow for discharge needs   Expected Discharge Date:                  Expected Discharge Plan:     In-House Referral:     Discharge planning Services  CM Consult  Post Acute Care Choice:    Choice offered to:     DME Arranged:    DME Agency:     HH Arranged:    HH Agency:     Status of Service:     If discussed at [employer], dates discussed:    Additional Comments:  Cherylann Parr, RN 12/06/2016, 4:27 PM

## 2016-12-06 NOTE — Progress Notes (Signed)
PULMONARY / CRITICAL CARE MEDICINE   Name: [name] MRN: [id number] DOB: 02-21-1951    ADMISSION DATE:  2016-12-31 CONSULTATION DATE:  12/31/2016  REFERRING MD:  Hyacinth Meeker  CHIEF COMPLAINT:  Fall  HISTORY OF PRESENT ILLNESS:  Pt is encephelopathic; therefore, this HPI is obtained from chart review. [name] is a 66 y.o. female with PMH as outlined below. He presented to [facility] ED on 8/20 after she was found down on the floor of a home by her daughter. She apparently was normal earlier that morning when daughter left for work. When daughter returned home from work around 5 PM, patient informed daughter that she had fallen onto the floor around 9 or 10 AM and had been unable to get up for the whole day.  On arrival to ED, she was hypotensive and bradycardic. She was taken for CT scan of the head and while in CT, she became cyanotic and unresponsive. Shortly thereafter, she was noted to be pulseless. ACLS was started and ROSC was achieved within 30 seconds. She was intubated during this timeframe.  She was later found to have metabolic acidosis along with hypokalemia and acute on chronic renal failure (she had apparently been seen by nephrology in the past; however, was deemed to be a poor candidate for long-term dialysis due to her cardiac function). She was also previously listed as DO NOT RESUSCITATE; however, daughter states that the only reason for this was because patient would never one to be placed on machines for a long time period. She would however be okay with short-term intubation and ACLS if needed. She is also okay with short-term dialysis for acute issues.  She was evaluated by nephrology who opted to begin CRRT.   SUBJECTIVE:  No change overnight.  On CRRT.   VITAL SIGNS: BP (!) 93/46   Pulse 87   Temp (!) 97.2 F (36.2 C)   Resp (!) 32   Ht 5\' 2"  (1.575 m)   Wt 117.7 kg (259 lb 7.7 oz)   SpO2 100%   BMI 47.46 kg/m   HEMODYNAMICS: CVP:  [21 mmHg-26  mmHg] 22 mmHg  VENTILATOR SETTINGS: Vent Mode: PCV FiO2 (%):  [40 %] 40 % Set Rate:  [30 bmp] 30 bmp PEEP:  [5 cmH20] 5 cmH20 Plateau Pressure:  [24 cmH20-33 cmH20] 24 cmH20  INTAKE / OUTPUT: I/O last 3 completed shifts: In: 3060.2 [I.V.:1790.2; NG/GT:1070; IV Piggyback:200] Out: 3941 [Urine:80; Other:3861]  PHYSICAL EXAMINATION: General: Chronically ill-appearing female, obese, critically ill. Neuro: minimal response on vent  HEENT: [place]/AT. PERRL, sclerae anicteric. Cardiovascular: RRR, no M/R/G.  Lungs: resps even non labored on PS 10/5, coarse  Abdomen: Obese, soft, NT/ND. Skin breakdown noted to pannus with erythema, mainly on the left. Musculoskeletal: No gross deformities, no edema.  Skin: Intact, warm, no rashes. Skin breakdown as above.  LABS:  BMET  Recent Labs Lab 12/05/16 0403 12/05/16 1559 12/06/16 0340  NA 135 136 134*  K 3.7 3.6 4.4  CL 95* 101 101  CO2 28 27 27   BUN 62* 40* 30*  CREATININE 3.97* 2.82* 2.13*  GLUCOSE 162* 119* 132*   Electrolytes  Recent Labs Lab 12/04/16 0530  12/05/16 0403 12/05/16 1559 12/06/16 0340  CALCIUM 9.1  < > 8.0* 7.8* 7.8*  MG 2.5*  --  2.2  --  2.3  PHOS 10.0*  < > 4.1 2.5 1.9*  < > = values in this interval not displayed. CBC  Recent Labs Lab 12/04/16 0530 12/05/16 0403 12/06/16 0340  WBC 19.0* 11.5* 9.6  HGB 12.1 11.0* 10.6*  HCT 36.7 33.0* 32.7*  PLT 162 132* 140*    Coag's  Recent Labs Lab 01/01/17 1858 12/06/16 0340  APTT 38* 143*  INR 2.20  --     Sepsis Markers  Recent Labs Lab 2017-01-01 1917 12/04/16 0405 12/05/16 0403 12/06/16 0340  LATICACIDVEN 4.54* 4.6*  --   --   PROCALCITON  --  0.19 1.06 0.98    ABG  Recent Labs Lab 12/05/16 0435 12/06/16 0400 12/06/16 0406  PHART 7.482* 7.444 7.471*  PCO2ART 38.7 40.5 39.0  PO2ART 138* 111* 145.0*    Liver Enzymes  Recent Labs Lab 2017-01-01 1858  12/05/16 0403 12/05/16 1559 12/06/16 0340  AST 328*  --   --   --   --    ALT 186*  --   --   --   --   ALKPHOS 45  --   --   --   --   BILITOT 1.5*  --   --   --   --   ALBUMIN 3.5  < > 3.0* 2.6* 2.6*  < > = values in this interval not displayed.  Cardiac Enzymes  Recent Labs Lab 12/04/16 0405 12/04/16 0530  TROPONINI 2.95* 2.97*    Glucose  Recent Labs Lab 12/05/16 0829 12/05/16 1129 12/05/16 2008 12/05/16 2335 12/06/16 0337 12/06/16 0814  GLUCAP 146* 130* 122* 116* 136* 133*    Imaging Dg Chest Port 1 View  Result Date: 12/06/2016 CLINICAL DATA:  Intubation. EXAM: PORTABLE CHEST 1 VIEW COMPARISON:  12/05/2016 . FINDINGS: Endotracheal tube, NG tube, right IJ line, left IJ line stable position. Prior CABG. Stable cardiomegaly. Interim resolution of rounded density noted over the right mid lung suggesting clearing of a fluid pseudotumor. Low lung volumes with basilar atelectasis. Improved aeration from prior exam. A mild component basilar interstitial edema cannot be excluded. No pneumothorax . IMPRESSION: 1.  Lines and tubes in stable position. 2. Prior CABG. Stable cardiomegaly. Low lung volumes with basilar atelectasis. Improvement of aeration from prior exam. A mild component of interstitial edema cannot be excluded. 3. Interim resolution of rounded density noted over the right mid lung suggesting clearing of a fluid pseudotumor . Electronically Signed   By: Maisie Fus  Register   On: 12/06/2016 06:42     STUDIES:  CT head 8/20 > no acute process. CXR 8/20 > mild edema.  CULTURES: Blood 8/21 >   ANTIBIOTICS: Fluconazole 8/21>>>  SIGNIFICANT EVENTS: 8/20 > admit.  LINES/TUBES: ETT 8/20 >  R IJ CVL 8/20 >  L IJ HD cath 8/20 >   DISCUSSION: 66 y.o. female with acute on chronic renal failure (seen by nephrology in the past however deemed to be a poor candidate for long-term dialysis due to cardiac function). Admitted 8/20 after fall at home. While in CT scan, developed brief cardiac arrest (30 seconds). Found to have worsening renal  failure, acidosis, hyperkalemia; therefore, nephrology consultation and planning for CRRT.  Patient intubated following brief code.  ASSESSMENT / PLAN:  PULMONARY A: Respiratory insufficiency. Hx OSA - not on CPAP P:   Vent support - 8cc/kg  F/u CXR  F/u ABG  PS wean as able    CARDIOVASCULAR A:  Shock - suspect cardiogenic.  No history to suspect septic.  Brief cardiac arrest - presumed PEA due to hypokalemia and setting of worsening renal function. Troponin leak - demand. Hx CHF (Echo from 7/18 with EF 15-20%, G2DD, PAP 63), HLD, CAD.  P:  Continue levophed - wean as tol  Monitor CVP - currently 23  Pull volume with CRRT  May need cards involvement - hold for now Continue preadmission ASA. Hold preadmission carvedilol, statin, enalapril, furosemide, nitro.  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 Hyponatremia. Hypokalemia. Hypocalcemia. P:   CRRT per renal  BMP in AM. Replace electrolytes as indicated Volume removal with CRRT as able   GASTROINTESTINAL A:   Morbid obesity. GI prophylaxis. Nutrition. P:   SUP: Pantoprazole. NPO. TF per nutrition   HEMATOLOGIC A:   VTE Prophylaxis. P:  SCD's / heparin. CBC in AM.  INFECTIOUS A:   Candida intertrigo. No indication for acute infection. P:   Nystatin powder to pannus. Fluconazole systemic for yeast infection in the pelvic region that is difficult to approach given anatomy Follow cultures as above.  Assess PCT 0.19, no empiric abx  ENDOCRINE A:   Hx DM. P:   SSI.  NEUROLOGIC A:   Acute encephalopathy. Hx CVA, depression. P:   Sedation:  Fentanyl PRN / Midazolam PRN. RASS goal: 0 to -1. Daily WUA.  Family updated: daughter updated at bedside 8/23  Interdisciplinary Family Meeting v Palliative Care Meeting:  Due by: 12/10/16.  Dirk Dress, NP 12/06/2016  12:00 PM Pager: [phone] or [phone]  Attending Note:  66 year old female with extensive cardiac history presenting with CHF, cardiogenic shock and acute renal failure.  Patient is not a long term dialysis candidate.  On exam, patient is sedate but arousable and not weaning.  I reviewed CXR myself, pulmonary edema noted.  CVP 23.  CRRT with negative 75 ml/hr at this point.  Will continue volume negative with the hope to extubate to allow some time with the patient and family.  I spoke with the daughter in details.  Expectations are realistic.  Will continue volume negative for now then extubate with no intention to reintubate once ready.  Renal function and BP support as above.  The patient is critically ill with multiple organ systems failure and requires high complexity decision making for assessment and support, frequent evaluation and titration of therapies, application of advanced monitoring technologies and extensive interpretation of multiple databases.   Critical Care Time devoted to patient care services described in this note is  35  Minutes. This time reflects time of care of this signee Dr Koren Bound. This critical care time does not reflect procedure time, or teaching time or supervisory time of PA/NP/Med student/Med Resident etc but could involve care discussion time.  Alyson Reedy, M.D. [facility] Pulmonary/Critical Care Medicine. Pager: [phone]. After hours pager: [phone].

## 2016-12-06 NOTE — Progress Notes (Signed)
  Echocardiogram 2D Echocardiogram has been performed.  [name] 12/06/2016, 11:46 AM

## 2016-12-06 NOTE — Progress Notes (Signed)
eLink Physician-Brief Progress Note Patient Name: [name] DOB: 1951/03/19 MRN: [id number]   Date of Service  12/06/2016  HPI/Events of Note  Hypotension - BP = 104/46 with MAP = 59 on Norepinephrine IV infusion at 40 mcg/min. CVP = 20.  eICU Interventions  Will order: 1. 12 Lead EKG now. 2. Cycle Troponins. 3. CBC now.  4. ABG now.      Intervention Category Major Interventions: Hypotension - evaluation and management  Sommer,Steven Dennard Nip 12/06/2016, 11:58 PM

## 2016-12-06 NOTE — Progress Notes (Signed)
Subjective: Interval History: on vent , opens eyes, .  Objective: Vital signs in last 24 hours: Temp:  [95.9 F (35.5 C)-97.8 F (36.6 C)] 97 F (36.1 C) (08/23 0600) Pulse Rate:  [52-83] 76 (08/23 0630) Resp:  [7-31] 13 (08/23 0630) BP: (43-107)/(26-48) 68/44 (08/23 0600) SpO2:  [86 %-100 %] 100 % (08/23 0630) FiO2 (%):  [40 %] 40 % (08/23 0339) Weight:  [117.7 kg (259 lb 7.7 oz)] 117.7 kg (259 lb 7.7 oz) (08/23 0302) Weight change: -1.1 kg (-2 lb 6.8 oz)  Intake/Output from previous day: 08/22 0701 - 08/23 0700 In: 1650.2 [I.V.:740.2; NG/GT:710; IV Piggyback:200] Out: 2456 [Urine:45] Intake/Output this shift: Total I/O In: 510.1 [I.V.:210.1; NG/GT:300] Out: 898 [Urine:45; Other:853]  General appearance: mildly obese, pale and sedated on vent , opens eyes, no verbal command response Neck: LIJ HD cath Resp: rales bibasilar and rhonchi bilaterally Cardio: S1, S2 normal and systolic murmur: systolic ejection 2/6, decrescendo at 2nd left intercostal space GI: obese, pos bs, soft, striae , abdm wall edema Extremities: edema 3-4+  Lab Results:  Recent Labs  12/05/16 0403 12/06/16 0340  WBC 11.5* 9.6  HGB 11.0* 10.6*  HCT 33.0* 32.7*  PLT 132* 140*   BMET:  Recent Labs  12/05/16 1559 12/06/16 0340  NA 136 134*  K 3.6 4.4  CL 101 101  CO2 27 27  GLUCOSE 119* 132*  BUN 40* 30*  CREATININE 2.82* 2.13*  CALCIUM 7.8* 7.8*   No results for input(s): PTH in the last 72 hours. Iron Studies: No results for input(s): IRON, TIBC, TRANSFERRIN, FERRITIN in the last 72 hours.  Studies/Results: Dg Chest Port 1 View  Result Date: 12/05/2016 CLINICAL DATA:  Intubation. EXAM: PORTABLE CHEST 1 VIEW COMPARISON:  12/04/2016. FINDINGS: Endotracheal tube, NG tube, right IJ line stable position. Prior CABG. Cardiomegaly. Persistent basilar atelectasis. Rounded density noted over the right mid lung consistent with focal infiltrate/ edema versus fluid pseudotumor. No pneumothorax.  IMPRESSION: 1. Lines and tubes in stable position. 2. Persistent basilar atelectasis. Rounded density noted over the right mid lung consistent with focal infiltrate/edema versus fluid pseudotumor. Prior CABG. Stable cardiomegaly. Electronically Signed   By: Maisie Fus  Register   On: 12/05/2016 06:49    I have reviewed the patient's current medications.  Assessment/Plan: 1 AKI/CKD vol xs , CRRT going well with good solute, acid/base/K control. Needs P repletion. Will try for more neg. not making much for urine so stop foley 2 Cardiac arrest 3 DM controlled 4 CVA 5 VDRF per CCM 6 Shock still on NE 7 Anemia mild 8 HPTH check PTH 9 Nutrition TF P ^ net neg, stop foley, vent, check PTH  LOS: 3 days   Elyce Zollinger L 12/06/2016,6:56 AM

## 2016-12-07 ENCOUNTER — Inpatient Hospital Stay (HOSPITAL_COMMUNITY): Payer: Medicare Other

## 2016-12-07 DIAGNOSIS — I442 Atrioventricular block, complete: Secondary | ICD-10-CM

## 2016-12-07 LAB — POCT I-STAT 3, ART BLOOD GAS (G3+)
Acid-base deficit: 1 mmol/L (ref 0.0–2.0)
Bicarbonate: 25.8 mmol/L (ref 20.0–28.0)
O2 Saturation: 88 %
PCO2 ART: 49 mmHg — AB (ref 32.0–48.0)
PH ART: 7.329 — AB (ref 7.350–7.450)
PO2 ART: 59 mmHg — AB (ref 83.0–108.0)
TCO2: 27 mmol/L (ref 22–32)

## 2016-12-07 LAB — RENAL FUNCTION PANEL
Albumin: 2.4 g/dL — ABNORMAL LOW (ref 3.5–5.0)
Anion gap: 12 (ref 5–15)
BUN: 18 mg/dL (ref 6–20)
CALCIUM: 7.8 mg/dL — AB (ref 8.9–10.3)
CO2: 22 mmol/L (ref 22–32)
CREATININE: 1.3 mg/dL — AB (ref 0.44–1.00)
Chloride: 102 mmol/L (ref 101–111)
GFR, EST AFRICAN AMERICAN: 48 mL/min — AB (ref >60)
GFR, EST NON AFRICAN AMERICAN: 41 mL/min — AB (ref >60)
Glucose, Bld: 132 mg/dL — ABNORMAL HIGH (ref 65–99)
Phosphorus: 2 mg/dL — ABNORMAL LOW (ref 2.5–4.6)
Potassium: 4.1 mmol/L (ref 3.5–5.1)
SODIUM: 136 mmol/L (ref 135–145)

## 2016-12-07 LAB — POCT ACTIVATED CLOTTING TIME
ACTIVATED CLOTTING TIME: 191 s
ACTIVATED CLOTTING TIME: 202 s
ACTIVATED CLOTTING TIME: 208 s
ACTIVATED CLOTTING TIME: 208 s

## 2016-12-07 LAB — CBC
HCT: 35.1 % — ABNORMAL LOW (ref 36.0–46.0)
HEMOGLOBIN: 11.3 g/dL — AB (ref 12.0–15.0)
MCH: 30.7 pg (ref 26.0–34.0)
MCHC: 32.2 g/dL (ref 30.0–36.0)
MCV: 95.4 fL (ref 78.0–100.0)
PLATELETS: 125 10*3/uL — AB (ref 150–400)
RBC: 3.68 MIL/uL — AB (ref 3.87–5.11)
RDW: 17.5 % — ABNORMAL HIGH (ref 11.5–15.5)
WBC: 9.1 10*3/uL (ref 4.0–10.5)

## 2016-12-07 LAB — BLOOD GAS, ARTERIAL
Acid-base deficit: 3.1 mmol/L — ABNORMAL HIGH (ref 0.0–2.0)
BICARBONATE: 22.3 mmol/L (ref 20.0–28.0)
Drawn by: 41977
FIO2: 50
O2 Saturation: 90.5 %
PCO2 ART: 46.3 mmHg (ref 32.0–48.0)
PEEP/CPAP: 5 cmH2O
PO2 ART: 63.2 mmHg — AB (ref 83.0–108.0)
Patient temperature: 98.6
Pressure control: 20 cmH2O
RATE: 30 resp/min
pH, Arterial: 7.304 — ABNORMAL LOW (ref 7.350–7.450)

## 2016-12-07 LAB — COOXEMETRY PANEL
CARBOXYHEMOGLOBIN: 1.4 % (ref 0.5–1.5)
Carboxyhemoglobin: 1 % (ref 0.5–1.5)
Carboxyhemoglobin: 0.8 % (ref 0.5–1.5)
METHEMOGLOBIN: 0.9 % (ref 0.0–1.5)
METHEMOGLOBIN: 1.2 % (ref 0.0–1.5)
Methemoglobin: 1.2 % (ref 0.0–1.5)
O2 SAT: 46.1 %
O2 SAT: 38.1 %
O2 Saturation: 50.4 %
TOTAL HEMOGLOBIN: 11.1 g/dL — AB (ref 12.0–16.0)
TOTAL HEMOGLOBIN: 9.9 g/dL — AB (ref 12.0–16.0)
TOTAL HEMOGLOBIN: 9.7 g/dL — AB (ref 12.0–16.0)

## 2016-12-07 LAB — MAGNESIUM: MAGNESIUM: 2.1 mg/dL (ref 1.7–2.4)

## 2016-12-07 LAB — GLUCOSE, CAPILLARY
Glucose-Capillary: 123 mg/dL — ABNORMAL HIGH (ref 65–99)
Glucose-Capillary: 131 mg/dL — ABNORMAL HIGH (ref 65–99)

## 2016-12-07 LAB — APTT

## 2016-12-07 LAB — TROPONIN I
TROPONIN I: 1.86 ng/mL — AB (ref <0.03)
Troponin I: 1.41 ng/mL (ref <0.03)

## 2016-12-07 LAB — PARATHYROID HORMONE, INTACT (NO CA): PTH: 397 pg/mL — ABNORMAL HIGH (ref 15–65)

## 2016-12-07 MED ORDER — DOBUTAMINE IN D5W 4-5 MG/ML-% IV SOLN
5.0000 ug/kg/min | INTRAVENOUS | Status: DC
  Administered 2016-12-07: 2.5 ug/kg/min via INTRAVENOUS
  Filled 2016-12-07: qty 250

## 2016-12-07 MED ORDER — SODIUM CHLORIDE 0.9 % IV SOLN
10.0000 mg/h | INTRAVENOUS | Status: DC
  Administered 2016-12-07: 10 mg/h via INTRAVENOUS
  Filled 2016-12-07: qty 10

## 2016-12-07 MED ORDER — SODIUM PHOSPHATES 45 MMOLE/15ML IV SOLN
30.0000 mmol | Freq: Once | INTRAVENOUS | Status: AC
  Administered 2016-12-07: 30 mmol via INTRAVENOUS
  Filled 2016-12-07: qty 10

## 2016-12-07 MED ORDER — MORPHINE BOLUS VIA INFUSION
5.0000 mg | INTRAVENOUS | Status: DC | PRN
  Filled 2016-12-07: qty 20

## 2016-12-07 MED ORDER — SODIUM CHLORIDE 0.9 % IV BOLUS (SEPSIS)
500.0000 mL | Freq: Once | INTRAVENOUS | Status: AC
  Administered 2016-12-07: 500 mL via INTRAVENOUS

## 2016-12-10 ENCOUNTER — Ambulatory Visit: Payer: Medicare Other | Admitting: Student

## 2016-12-10 ENCOUNTER — Telehealth: Payer: Self-pay | Admitting: Endocrinology

## 2016-12-10 NOTE — Telephone Encounter (Signed)
Patient's daughter called to advise that the patient passed away on last Aug 19, 2022. All appointments have been cancelled. No further action required.

## 2016-12-12 ENCOUNTER — Encounter (HOSPITAL_COMMUNITY): Payer: Self-pay | Admitting: Emergency Medicine

## 2016-12-13 ENCOUNTER — Telehealth: Payer: Self-pay

## 2016-12-13 NOTE — Discharge Summary (Signed)
NAME:  [name], [name]       ACCOUNT NO.:  [id number]  MEDICAL RECORD NO.:  [id number]  LOCATION:                               FACILITY:  [facility]  PHYSICIAN:  Felipa EvenerWesam Jake Yacoub, MD  DATE OF BIRTH:  05-22-1950  DATE OF ADMISSION:  11/24/2016 DATE OF DISCHARGE:  Apr 15, 2017                              DISCHARGE SUMMARY   DEATH SUMMARY  PRIMARY DIAGNOSIS/CAUSE OF DEATH:  Cardiogenic shock.  SECONDARY DIAGNOSES:  Complete heart block, acute respiratory failure, obstructive sleep apnea, on CPAP, pulseless electric activity cardiac arrest, CHF, acute on chronic kidney disease, hyponatremia, hypokalemia, hypocalcemia, morbid obesity, Candida intertrigo, diabetes mellitus, acute encephalopathy, history of cerebrovascular accident.  The patient is a 66 year old female with history of CVA that left her significantly debilitated, presenting to the hospital with acute pulmonary edema and respiratory failure, was noted to be in cardiogenic shock.  On the morning of August 24, the patient was noted to be in complete heart block.  Ongoing discussion with daughter, she informs that the patient has a very poor quality of life and has essentially been bedridden and would not want this level of care.  On the morning of August 24, when the patient was diagnosed with complete heart block and the family was informed that she would need a pacer in order to maintain cardiac rhythm, the daughter informs the patient did not want this level of care and definitely would not want a surgical procedure and would prefer comfort care at which point morphine was started, and the patient with extubated, expired shortly thereafter.     Felipa EvenerWesam Jake Yacoub, MD     WJY/MEDQ  D:  12/12/2016  T:  12/13/2016  Job:  [id number]

## 2016-12-13 NOTE — Telephone Encounter (Signed)
On 12/13/2016 I received a death certificate from [employer] (original). The Death certificate is for cremation. The patient is a patient of Doctor Molli KnockYacoub. The death certificate will be taken to [facility] this pm for signature. Doctor Jamison Neighborestor might be the physician to sign the death certificate since Doctor Molli KnockYacoub is on vacation until next week.   On 12/14/2016 I received the death certificate back from Doctor WeslacoSood. I got the death certificate ready and called the funeral home to let them know the death certificate is ready for pickup. I also faxed a copy to the funeral home per the funeral home request.

## 2016-12-15 NOTE — Progress Notes (Signed)
Patient becoming persistently more hypotensive. Patient now maxed on levophed gtt. HR dipping into the 50s with PVCs and PACs. Backed off of CRRT machine, no longer pulling any fluid. Arsenio Loader, MD made aware. Labs and ABG drawn, awaiting results. EKG obtained, see chart.

## 2016-12-15 NOTE — Progress Notes (Signed)
Responded to page for end of life w/ planned extubation. Consulted w/ nurse, who said pt's daughter said pt was not particularly "religious," but would like a prayer. Provided emotional/spiritual support, ministry of presence and prayer to pt's daughter and her boyfriend while pt was still intubated. Daughter said it was fine to pray to God, and the prayer seemed to comfort her. Chaplain available for f/u.   01-02-2017 1100  Clinical Encounter Type  Visited With Patient and family together;Health care provider  Visit Type Initial;Psychological support;Spiritual support;Social support;Critical Care  Referral From Nurse  Spiritual Encounters  Spiritual Needs Prayer;Emotional  Stress Factors  Patient Stress Factors Health changes;Loss of control  Family Stress Factors Family relationships;Health changes;Loss;Loss of control   Ephraim Hamburger, [address]

## 2016-12-15 NOTE — Progress Notes (Addendum)
Pt extubated with comfort measures at 1250. Vasoactive drips d/c and morphine drip started prior to extubation. Daughter at bedside. Pt passed at 1306. Absence of heart tones verified by Schuyler Amor, RN. Daughter, [name], not sure as to funeral arrangements at this time. Given card for patient placement.   ME called Tamala Bari) as patient fell when discovered by daughter on day of admission. Tim felt that pts death was attributable to natural causes and was not related to trauma. Cleared from his standpoint.  CDS phoned. Pt suitable for tissue donation and CDS will follow up with daughter. NO need for eye prep.   No belongings in room to return to daughter  250cc morphine wasted in sink and witnessed by Barbaraann Faster RN

## 2016-12-15 NOTE — Progress Notes (Addendum)
eLink Physician-Brief Progress Note Patient Name: Dawnn Riggan DOB: 1950-05-23 MRN: [id number]   Date of Service  11/24/2016  HPI/Events of Note  LVEF = 15% and COOX = 46.1. Hgb from COOX = 11.1.  eICU Interventions  Will order: 1. Dobutamine IV infusion at 2.5 mcg/kg/min. 2. Repeat COOX at 6 AM on Dobutamine.      Intervention Category Major Interventions: Shock - evaluation and management;Hypotension - evaluation and management  Mella Inclan Eugene 12/06/2016, 4:02 AM

## 2016-12-15 NOTE — Progress Notes (Signed)
eLink Physician-Brief Progress Note Patient Name: [name] DOB: 1950/12/07 MRN: [id number]   Date of Service  11/26/2016  HPI/Events of Note  Hypotension and Shock - COOX on Dobutamine IV infusion at 2.5 mcg/kg/min = 50.4. Hgb = 9.9.  eICU Interventions  Will increase Dobutamine IV infusion to 5 mcg/kg/min.      Intervention Category Major Interventions: Hypotension - evaluation and management;Shock - evaluation and management  Sommer,Steven Dennard Nip 12/14/2016, 6:55 AM

## 2016-12-15 NOTE — Procedures (Signed)
Extubation Procedure Note  Patient Details:   Name: [name] DOB: 1951-03-31 MRN: [id number]   Airway Documentation:     Evaluation  O2 sats: stable throughout Complications: No apparent complications Patient did tolerate procedure well. Bilateral Breath Sounds: Rhonchi   Yes   Pt. Was terminally extubated to room air with RN and family at the bedside.  Carlynn Spry December 30, 2016, 12:50 PM

## 2016-12-15 NOTE — Progress Notes (Signed)
PULMONARY / CRITICAL CARE MEDICINE   Name: [name] MRN: [id number] DOB: [birth year]    ADMISSION DATE:  12-06-16 CONSULTATION DATE:  Dec 06, 2016  REFERRING MD:  Hyacinth Meeker  CHIEF COMPLAINT:  Fall  HISTORY OF PRESENT ILLNESS:  Pt is encephelopathic; therefore, this HPI is obtained from chart review. [name] is a 66 y.o. female with PMH as outlined below. He presented to [facility] ED on 8/20 after she was found down on the floor of a home by her daughter. She apparently was normal earlier that morning when daughter left for work. When daughter returned home from work around 5 PM, patient informed daughter that she had fallen onto the floor around 9 or 10 AM and had been unable to get up for the whole day.  On arrival to ED, she was hypotensive and bradycardic. She was taken for CT scan of the head and while in CT, she became cyanotic and unresponsive. Shortly thereafter, she was noted to be pulseless. ACLS was started and ROSC was achieved within 30 seconds. She was intubated during this timeframe.  She was later found to have metabolic acidosis along with hypokalemia and acute on chronic renal failure (she had apparently been seen by nephrology in the past; however, was deemed to be a poor candidate for long-term dialysis due to her cardiac function). She was also previously listed as DO NOT RESUSCITATE; however, daughter states that the only reason for this was because patient would never one to be placed on machines for a long time period. She would however be okay with short-term intubation and ACLS if needed. She is also okay with short-term dialysis for acute issues.  She was evaluated by nephrology who opted to begin CRRT.   SUBJECTIVE:  Severe hypotension overnight, maxed on pressors  VITAL SIGNS: BP (!) 42/29   Pulse 61   Temp (!) 97.3 F (36.3 C) (Oral)   Resp (!) 30   Ht 5\' 2"  (1.575 m)   Wt 117.2 kg (258 lb 6.1 oz)   SpO2 100%   BMI 47.26 kg/m    HEMODYNAMICS: CVP:  [19 mmHg] 19 mmHg  VENTILATOR SETTINGS: Vent Mode: PCV FiO2 (%):  [40 %-100 %] 100 % Set Rate:  [30 bmp] 30 bmp PEEP:  [5 cmH20] 5 cmH20 Pressure Support:  [10 cmH20] 10 cmH20 Plateau Pressure:  [21 cmH20-35 cmH20] 21 cmH20  INTAKE / OUTPUT: I/O last 3 completed shifts: In: 2547.5 [I.V.:924.5; NG/GT:1165; IV Piggyback:458] Out: 2865 [Urine:45; Other:2820]  PHYSICAL EXAMINATION: General: Chronically ill-appearing female, obese, critically ill. Neuro: Completely unresponsive. HEENT: /AT. PERRL, sclerae anicteric. Cardiovascular: Irregular, -M/R/G Lungs: Coarse BS diffusely Abdomen: Obese, soft, NT/ND. Skin breakdown noted to pannus with erythema, mainly on the left. Musculoskeletal: No gross deformities, no edema.  Skin: Intact, warm, no rashes. Skin breakdown as above.  LABS:  BMET  Recent Labs Lab 12/06/16 0340 12/06/16 1543 12/08/2016 0505  NA 134* 137 136  K 4.4 3.7 4.1  CL 101 104 102  CO2 27 26 22   BUN 30* 22* 18  CREATININE 2.13* 1.62* 1.30*  GLUCOSE 132* 85 132*   Electrolytes  Recent Labs Lab 12/05/16 0403  12/06/16 0340 12/06/16 1543 12/06/2016 0505  CALCIUM 8.0*  < > 7.8* 7.8* 7.8*  MG 2.2  --  2.3  --  2.1  PHOS 4.1  < > 1.9* 2.2* 2.0*  < > = values in this interval not displayed. CBC  Recent Labs Lab 12/05/16 0403 12/06/16 0022 12/06/16 0340  WBC 11.5* 9.1  9.6  HGB 11.0* 11.3* 10.6*  HCT 33.0* 35.1* 32.7*  PLT 132* 125* 140*   Coag's  Recent Labs Lab 11/29/2016 1858 12/06/16 0340 12-11-2016 0550  APTT 38* 143* >200*  INR 2.20  --   --    Sepsis Markers  Recent Labs Lab 11/23/2016 1917 12/04/16 0405 12/05/16 0403 12/06/16 0340  LATICACIDVEN 4.54* 4.6*  --   --   PROCALCITON  --  0.19 1.06 0.98   ABG  Recent Labs Lab 12/06/16 0406 11-Dec-2016 0013 12-11-16 0320  PHART 7.471* 7.329* 7.304*  PCO2ART 39.0 49.0* 46.3  PO2ART 145.0* 59.0* 63.2*   Liver Enzymes  Recent Labs Lab 12/01/2016 1858   12/06/16 0340 12/06/16 1543 2016-12-11 0505  AST 328*  --   --   --   --   ALT 186*  --   --   --   --   ALKPHOS 45  --   --   --   --   BILITOT 1.5*  --   --   --   --   ALBUMIN 3.5  < > 2.6* 2.5* 2.4*  < > = values in this interval not displayed.  Cardiac Enzymes  Recent Labs Lab 12/04/16 0530 12/06/16 0022 December 11, 2016 0533  TROPONINI 2.97* 1.86* 1.41*    Glucose  Recent Labs Lab 12/06/16 1211 12/06/16 1556 12/06/16 2017 12/06/16 2351 12/11/16 0318 12-11-2016 0839  GLUCAP 133* 89 112* 116* 123* 131*    Imaging Dg Chest Port 1 View  Result Date: 12/11/2016 CLINICAL DATA:  66 year old female intubated. EXAM: PORTABLE CHEST 1 VIEW COMPARISON:  12/06/2016 FINDINGS: An endotracheal tube terminates approximately 1.5 cm above the level of the carina. Bilateral IJ central venous catheters appear in stable position. An enteric tube courses below the diaphragm beyond the edge of the film. Median sternotomy wires again noted. The cardiomediastinal silhouette is enlarged but stable in size and morphology. Focal opacity is again noted over the right mid lung suggesting focal infiltrate. Visualized portions of the upper left lung are clear. No sizable pleural effusions. No pneumothorax. Visualized osseous structures grossly unremarkable. IMPRESSION: 1. Increasing focal opacity overlying the right midlung suggesting of focal infiltrate. 2. Stable cardiomegaly. 3. Stable positioning of life-support devices and postsurgical changes. Electronically Signed   By: Sande Brothers M.D.   On: 12/11/2016 06:58     STUDIES:  CT head 8/20 > no acute process. CXR 8/20 > mild edema.  CULTURES: Blood 8/21 >   ANTIBIOTICS: Fluconazole 8/21>>>  SIGNIFICANT EVENTS: 8/20 > admit.  LINES/TUBES: ETT 8/20 >  R IJ CVL 8/20 >  L IJ HD cath 8/20 >   DISCUSSION: 66 y.o. female with acute on chronic renal failure (seen by nephrology in the past however deemed to be a poor candidate for long-term  dialysis due to cardiac function). Admitted 8/20 after fall at home. While in CT scan, developed brief cardiac arrest (30 seconds). Found to have worsening renal failure, acidosis, hyperkalemia; therefore, nephrology consultation and planning for CRRT.  Patient intubated following brief code.  ASSESSMENT / PLAN:  PULMONARY A: Respiratory insufficiency. Hx OSA - not on CPAP P:   Vent support - 8cc/kg. F/u CXR. F/u ABG. Hold weaning given hemodynamics.  CARDIOVASCULAR A:  Shock - suspect cardiogenic.  No history to suspect septic.  Brief cardiac arrest - presumed PEA due to hypokalemia and setting of worsening renal function. Troponin leak - demand. Hx CHF (Echo from 7/18 with EF 15-20%, G2DD, PAP 63), HLD, CAD. P:  Continue levophed and vasopressin and dobutamine for now EKG to evaluate if truly a third degree AV block May need cards to see if 3rd degree is verified. Monitor CVP - currently 22 CRRT even at this point. Continue preadmission ASA. Hold preadmission carvedilol, statin, enalapril, furosemide, nitro.  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 Hyponatremia. Hypokalemia. Hypocalcemia. P:   CRRT per renal  BMP in AM. Replace electrolytes as indicated Volume even via CRRT  GASTROINTESTINAL A:   Morbid obesity. GI prophylaxis. Nutrition. P:   SUP: Pantoprazole. NPO. TF per nutrition   HEMATOLOGIC A:   VTE Prophylaxis. P:  SCD's / heparin. CBC in AM.  INFECTIOUS A:   Candida intertrigo. No indication for acute infection. P:   Nystatin powder to pannus. Fluconazole systemic for yeast infection in the pelvic region that is difficult to approach given anatomy Follow cultures as above.  Assess PCT 0.19, no empiric abx  ENDOCRINE A:   Hx DM. P:   SSI.  NEUROLOGIC A:   Acute encephalopathy. Hx CVA, depression. P:   Sedation:  Fentanyl PRN / Midazolam  PRN. RASS goal: 0 to -1. Daily WUA.  Family updated: Spoke with daughter, she does not believe patient would want a pacer placed but would like to speak, with her aunt first.  Will await decision prior to calling cards.  Interdisciplinary Family Meeting v Palliative Care Meeting:  Due by: 12/10/16.  The patient is critically ill with multiple organ systems failure and requires high complexity decision making for assessment and support, frequent evaluation and titration of therapies, application of advanced monitoring technologies and extensive interpretation of multiple databases.   Critical Care Time devoted to patient care services described in this note is  45  Minutes. This time reflects time of care of this signee Dr Koren Bound. This critical care time does not reflect procedure time, or teaching time or supervisory time of PA/NP/Med student/Med Resident etc but could involve care discussion time.  Alyson Reedy, M.D. [facility] Pulmonary/Critical Care Medicine. Pager: [phone]. After hours pager: [phone].

## 2016-12-15 NOTE — Progress Notes (Signed)
Subjective: Interval History: Progressive low bps, despite ^^pressors.  Objective: Vital signs in last 24 hours: Temp:  [97 F (36.1 C)-98.3 F (36.8 C)] 97.7 F (36.5 C) (08/24 0430) Pulse Rate:  [57-111] 74 (08/24 0700) Resp:  [10-33] 30 (08/24 0700) BP: (85-93)/(46-73) 85/73 (08/23 1526) SpO2:  [83 %-100 %] 97 % (08/24 0700) FiO2 (%):  [40 %-100 %] 100 % (08/24 0423) Weight:  [117.2 kg (258 lb 6.1 oz)] 117.2 kg (258 lb 6.1 oz) (08/24 0500) Weight change: -0.5 kg (-1 lb 1.6 oz)  Intake/Output from previous day: 08/23 0701 - 08/24 0700 In: 2037.4 [I.V.:714.4; NG/GT:865; IV Piggyback:458] Out: 1897  Intake/Output this shift: No intake/output data recorded.  General appearance: moderately obese, pale and on vent , not responsive Neck: Lij cath Resp: rales bibasilar and rhonchi bibasilar Cardio: S1, S2 normal and systolic murmur: holosystolic 2/6, blowing at apex GI: obese, striae, pos bs Extremities: edema 3+  Lab Results:  Recent Labs  12/06/16 0022 12/06/16 0340  WBC 9.1 9.6  HGB 11.3* 10.6*  HCT 35.1* 32.7*  PLT 125* 140*   BMET:  Recent Labs  12/06/16 1543 11/24/2016 0505  NA 137 136  K 3.7 4.1  CL 104 102  CO2 26 22  GLUCOSE 85 132*  BUN 22* 18  CREATININE 1.62* 1.30*  CALCIUM 7.8* 7.8*   No results for input(s): PTH in the last 72 hours. Iron Studies: No results for input(s): IRON, TIBC, TRANSFERRIN, FERRITIN in the last 72 hours.  Studies/Results: Dg Chest Port 1 View  Result Date: 12/08/2016 CLINICAL DATA:  66 year old female intubated. EXAM: PORTABLE CHEST 1 VIEW COMPARISON:  12/06/2016 FINDINGS: An endotracheal tube terminates approximately 1.5 cm above the level of the carina. Bilateral IJ central venous catheters appear in stable position. An enteric tube courses below the diaphragm beyond the edge of the film. Median sternotomy wires again noted. The cardiomediastinal silhouette is enlarged but stable in size and morphology. Focal opacity is  again noted over the right mid lung suggesting focal infiltrate. Visualized portions of the upper left lung are clear. No sizable pleural effusions. No pneumothorax. Visualized osseous structures grossly unremarkable. IMPRESSION: 1. Increasing focal opacity overlying the right midlung suggesting of focal infiltrate. 2. Stable cardiomegaly. 3. Stable positioning of life-support devices and postsurgical changes. Electronically Signed   By: Sande Brothers M.D.   On: 12/02/2016 06:58   Dg Chest Port 1 View  Result Date: 12/06/2016 CLINICAL DATA:  Intubation. EXAM: PORTABLE CHEST 1 VIEW COMPARISON:  12/05/2016 . FINDINGS: Endotracheal tube, NG tube, right IJ line, left IJ line stable position. Prior CABG. Stable cardiomegaly. Interim resolution of rounded density noted over the right mid lung suggesting clearing of a fluid pseudotumor. Low lung volumes with basilar atelectasis. Improved aeration from prior exam. A mild component basilar interstitial edema cannot be excluded. No pneumothorax . IMPRESSION: 1.  Lines and tubes in stable position. 2. Prior CABG. Stable cardiomegaly. Low lung volumes with basilar atelectasis. Improvement of aeration from prior exam. A mild component of interstitial edema cannot be excluded. 3. Interim resolution of rounded density noted over the right mid lung suggesting clearing of a fluid pseudotumor . Electronically Signed   By: Maisie Fus  Register   On: 12/06/2016 06:42    I have reviewed the patient's current medications.  Assessment/Plan: 1 CKD4/AKI low bps despite, ^^CVP.  Vol xs, but will give 1 bolus.  Good solute,  Mildly worse acid base, ie hypoperfusion. Outlook grim 2 Shock failing heart.  3 Anemia 4  CAD 5 CVA 6 VDRF 7 Obesity  P Phos, bolus, if not better in next 2-4 h, consider withdrawal    LOS: 4 days   Chalonda Schlatter L 12/21/2016,7:09 AM

## 2016-12-15 NOTE — Progress Notes (Signed)
Spoke with daughter and sister.  After discussion, they were informed that patient is in heart block and that she is in refractory shock.  After discussion, patient would not want this level of care and would prefer comfort.  Will place withdrawal order set.  The patient is critically ill with multiple organ systems failure and requires high complexity decision making for assessment and support, frequent evaluation and titration of therapies, application of advanced monitoring technologies and extensive interpretation of multiple databases.   Critical Care Time devoted to patient care services described in this note is  45  Minutes. This time reflects time of care of this signee Dr Koren Bound. This critical care time does not reflect procedure time, or teaching time or supervisory time of PA/NP/Med student/Med Resident etc but could involve care discussion time.  Alyson Reedy, M.D. [facility] Pulmonary/Critical Care Medicine. Pager: [phone]. After hours pager: [phone].

## 2016-12-15 DEATH — deceased

## 2017-01-14 NOTE — Discharge Summary (Signed)
NAME:  [name], [name]       ACCOUNT NO.:  [id number]  MEDICAL RECORD NO.:  [id number]  LOCATION:                                 FACILITY:  PHYSICIAN:  Felipa EvenerWesam Jake Teka Chanda, MD  DATE OF BIRTH:  03/12/1951  DATE OF ADMISSION:  12/14/2016 DATE OF DISCHARGE:  October 03, 2016                              DISCHARGE SUMMARY   DEATH SUMMARY  PRIMARY DIAGNOSIS/CAUSE OF DEATH:  Pulseless electrical activity cardiac arrest.  SECONDARY DIAGNOSES:  Acute respiratory failure with hypoxemia, cardiogenic shock, history of systolic heart failure, acute-on-chronic kidney disease, hyponatremia, hypokalemia, hypocalcemia, morbid obesity, Candida skin infection, diabetes mellitus, acute encephalopathy, history of cerebrovascular accident.  The patient is a 66 year old female with history of extensive strokes with significant debility, who was found unresponsive at home, was found to be in pulseless electrical activity cardiac arrest.  The patient was resuscitated and admitted to the Hospital onto the medical ICU.  Then, the patient has been debilitated for quite some time and after discussion with the family, at that point, she was made do not resuscitate.  The patient was supported in the medical ICU; however, on the morning of December 07, 2016, the patient was found to be complete heart block and the pacemaker would have been necessity.  Family was contacted and informed that the patient would not want that level of care and rather would be comfortable, at which point, morphine was started and the patient became a full comfort care to expire shortly thereafter with family at bedside.     Felipa EvenerWesam Jake Davielle Lingelbach, MD     WJY/MEDQ  D:  12/20/2016  T:  12/21/2016  Job:  [id number]
